# Patient Record
Sex: Female | Born: 1950 | Race: White | Hispanic: No | Marital: Married | Smoking: Never smoker
Health system: Southern US, Community
[De-identification: ages and names within clinical notes are randomized; demographics above are authoritative.]

## PROBLEM LIST (undated history)

## (undated) DIAGNOSIS — E039 Hypothyroidism, unspecified: Secondary | ICD-10-CM

## (undated) DIAGNOSIS — E7800 Pure hypercholesterolemia, unspecified: Secondary | ICD-10-CM

## (undated) DIAGNOSIS — E119 Type 2 diabetes mellitus without complications: Secondary | ICD-10-CM

## (undated) DIAGNOSIS — F419 Anxiety disorder, unspecified: Secondary | ICD-10-CM

## (undated) DIAGNOSIS — H5440 Blindness, one eye, unspecified eye: Secondary | ICD-10-CM

## (undated) DIAGNOSIS — K219 Gastro-esophageal reflux disease without esophagitis: Secondary | ICD-10-CM

## (undated) DIAGNOSIS — L0390 Cellulitis, unspecified: Secondary | ICD-10-CM

## (undated) DIAGNOSIS — N390 Urinary tract infection, site not specified: Secondary | ICD-10-CM

## (undated) DIAGNOSIS — M1990 Unspecified osteoarthritis, unspecified site: Secondary | ICD-10-CM

## (undated) DIAGNOSIS — G709 Myoneural disorder, unspecified: Secondary | ICD-10-CM

## (undated) DIAGNOSIS — G35 Multiple sclerosis: Secondary | ICD-10-CM

## (undated) DIAGNOSIS — Z7401 Bed confinement status: Secondary | ICD-10-CM

## (undated) DIAGNOSIS — E079 Disorder of thyroid, unspecified: Secondary | ICD-10-CM

## (undated) HISTORY — PX: CHOLECYSTECTOMY: SHX55

## (undated) HISTORY — PX: ADENOIDECTOMY: SUR15

## (undated) HISTORY — PX: WRIST SURGERY: SHX841

## (undated) HISTORY — PX: FRACTURE SURGERY: SHX138

## (undated) HISTORY — PX: LYMPH GLAND EXCISION: SHX13

## (undated) HISTORY — PX: TONSILLECTOMY: SUR1361

---

## 2002-11-20 ENCOUNTER — Ambulatory Visit (HOSPITAL_COMMUNITY): Admission: RE | Admit: 2002-11-20 | Discharge: 2002-11-20 | Payer: Self-pay | Admitting: Urology

## 2002-11-20 ENCOUNTER — Encounter: Payer: Self-pay | Admitting: Urology

## 2003-07-03 HISTORY — PX: ORIF ANKLE FRACTURE: SUR919

## 2003-12-27 ENCOUNTER — Inpatient Hospital Stay (HOSPITAL_COMMUNITY): Admission: EM | Admit: 2003-12-27 | Discharge: 2003-12-31 | Payer: Self-pay | Admitting: Emergency Medicine

## 2004-01-28 ENCOUNTER — Emergency Department (HOSPITAL_COMMUNITY): Admission: EM | Admit: 2004-01-28 | Discharge: 2004-01-28 | Payer: Self-pay | Admitting: Emergency Medicine

## 2007-10-18 ENCOUNTER — Inpatient Hospital Stay (HOSPITAL_COMMUNITY): Admission: EM | Admit: 2007-10-18 | Discharge: 2007-10-26 | Payer: Self-pay | Admitting: Emergency Medicine

## 2007-10-19 ENCOUNTER — Encounter (INDEPENDENT_AMBULATORY_CARE_PROVIDER_SITE_OTHER): Payer: Self-pay | Admitting: Internal Medicine

## 2007-10-20 ENCOUNTER — Encounter (INDEPENDENT_AMBULATORY_CARE_PROVIDER_SITE_OTHER): Payer: Self-pay | Admitting: Internal Medicine

## 2007-10-20 ENCOUNTER — Ambulatory Visit: Payer: Self-pay | Admitting: Vascular Surgery

## 2007-10-23 ENCOUNTER — Ambulatory Visit: Payer: Self-pay | Admitting: Physical Medicine & Rehabilitation

## 2008-10-12 ENCOUNTER — Encounter: Admission: RE | Admit: 2008-10-12 | Discharge: 2008-12-28 | Payer: Self-pay | Admitting: Urology

## 2008-11-01 ENCOUNTER — Inpatient Hospital Stay (HOSPITAL_COMMUNITY): Admission: EM | Admit: 2008-11-01 | Discharge: 2008-11-10 | Payer: Self-pay | Admitting: Emergency Medicine

## 2008-11-08 ENCOUNTER — Ambulatory Visit: Payer: Self-pay | Admitting: Physical Medicine & Rehabilitation

## 2008-11-10 ENCOUNTER — Inpatient Hospital Stay (HOSPITAL_COMMUNITY)
Admission: RE | Admit: 2008-11-10 | Discharge: 2008-11-25 | Payer: Self-pay | Admitting: Physical Medicine & Rehabilitation

## 2008-12-27 ENCOUNTER — Emergency Department (HOSPITAL_COMMUNITY): Admission: EM | Admit: 2008-12-27 | Discharge: 2008-12-28 | Payer: Self-pay | Admitting: Emergency Medicine

## 2009-03-01 ENCOUNTER — Inpatient Hospital Stay (HOSPITAL_COMMUNITY): Admission: EM | Admit: 2009-03-01 | Discharge: 2009-03-10 | Payer: Self-pay | Admitting: Emergency Medicine

## 2009-03-02 ENCOUNTER — Ambulatory Visit: Payer: Self-pay | Admitting: Vascular Surgery

## 2009-03-02 ENCOUNTER — Encounter (INDEPENDENT_AMBULATORY_CARE_PROVIDER_SITE_OTHER): Payer: Self-pay | Admitting: Internal Medicine

## 2009-03-03 ENCOUNTER — Ambulatory Visit: Payer: Self-pay | Admitting: Physical Medicine & Rehabilitation

## 2009-03-10 ENCOUNTER — Ambulatory Visit: Payer: Self-pay | Admitting: Physical Medicine & Rehabilitation

## 2009-03-10 ENCOUNTER — Inpatient Hospital Stay (HOSPITAL_COMMUNITY)
Admission: RE | Admit: 2009-03-10 | Discharge: 2009-03-25 | Payer: Self-pay | Admitting: Physical Medicine & Rehabilitation

## 2009-03-18 ENCOUNTER — Ambulatory Visit: Payer: Self-pay | Admitting: Psychology

## 2009-06-03 ENCOUNTER — Inpatient Hospital Stay (HOSPITAL_COMMUNITY): Admission: EM | Admit: 2009-06-03 | Discharge: 2009-06-10 | Payer: Self-pay | Admitting: Emergency Medicine

## 2009-06-28 ENCOUNTER — Inpatient Hospital Stay (HOSPITAL_COMMUNITY): Admission: EM | Admit: 2009-06-28 | Discharge: 2009-07-02 | Payer: Self-pay | Admitting: Emergency Medicine

## 2009-07-02 ENCOUNTER — Emergency Department (HOSPITAL_COMMUNITY): Admission: EM | Admit: 2009-07-02 | Discharge: 2009-07-03 | Payer: Self-pay | Admitting: Emergency Medicine

## 2010-03-05 ENCOUNTER — Emergency Department (HOSPITAL_COMMUNITY): Admission: EM | Admit: 2010-03-05 | Discharge: 2010-03-06 | Payer: Self-pay | Admitting: Emergency Medicine

## 2010-03-29 ENCOUNTER — Ambulatory Visit: Payer: Self-pay | Admitting: Pulmonary Disease

## 2010-03-29 ENCOUNTER — Inpatient Hospital Stay (HOSPITAL_COMMUNITY): Admission: EM | Admit: 2010-03-29 | Discharge: 2010-04-02 | Payer: Self-pay | Admitting: Emergency Medicine

## 2010-04-11 ENCOUNTER — Ambulatory Visit: Payer: Self-pay | Admitting: Thoracic Surgery

## 2010-04-17 ENCOUNTER — Ambulatory Visit (HOSPITAL_COMMUNITY): Admission: RE | Admit: 2010-04-17 | Discharge: 2010-04-17 | Payer: Self-pay | Admitting: Thoracic Surgery

## 2010-04-24 ENCOUNTER — Ambulatory Visit: Payer: Self-pay | Admitting: Thoracic Surgery

## 2010-04-24 ENCOUNTER — Encounter: Payer: Self-pay | Admitting: Thoracic Surgery

## 2010-04-24 ENCOUNTER — Ambulatory Visit (HOSPITAL_COMMUNITY): Admission: RE | Admit: 2010-04-24 | Discharge: 2010-04-25 | Payer: Self-pay | Admitting: Thoracic Surgery

## 2010-04-24 HISTORY — PX: MEDIASTINOSCOPY: SUR861

## 2010-06-21 ENCOUNTER — Encounter: Admission: RE | Admit: 2010-06-21 | Payer: Self-pay | Source: Home / Self Care | Admitting: Geriatric Medicine

## 2010-09-13 LAB — SURGICAL PCR SCREEN
MRSA, PCR: NEGATIVE
Staphylococcus aureus: NEGATIVE

## 2010-09-13 LAB — COMPREHENSIVE METABOLIC PANEL
ALT: 24 U/L (ref 0–35)
AST: 35 U/L (ref 0–37)
Albumin: 3.6 g/dL (ref 3.5–5.2)
Alkaline Phosphatase: 96 U/L (ref 39–117)
BUN: 13 mg/dL (ref 6–23)
CO2: 30 mEq/L (ref 19–32)
Calcium: 9.6 mg/dL (ref 8.4–10.5)
Chloride: 102 mEq/L (ref 96–112)
Creatinine, Ser: 0.59 mg/dL (ref 0.4–1.2)
GFR calc Af Amer: >60 mL/min (ref >60)
GFR calc non Af Amer: >60 mL/min (ref >60)
Glucose, Bld: 103 mg/dL — ABNORMAL HIGH (ref 70–99)
Potassium: 3.3 mEq/L — ABNORMAL LOW (ref 3.5–5.1)
Sodium: 141 mEq/L (ref 135–145)
Total Bilirubin: 1 mg/dL (ref 0.3–1.2)
Total Protein: 7 g/dL (ref 6.0–8.3)

## 2010-09-13 LAB — CBC
HCT: 35.9 % — ABNORMAL LOW (ref 36.0–46.0)
HCT: 42.7 % (ref 36.0–46.0)
Hemoglobin: 14.2 g/dL (ref 12.0–15.0)
MCH: 28.4 pg (ref 26.0–34.0)
MCH: 29.6 pg (ref 26.0–34.0)
MCHC: 31.5 g/dL (ref 30.0–36.0)
MCHC: 33.3 g/dL (ref 30.0–36.0)
MCV: 89.1 fL (ref 78.0–100.0)
Platelets: 258 10*3/uL (ref 150–400)
RBC: 4.79 MIL/uL (ref 3.87–5.11)
RDW: 13.7 % (ref 11.5–15.5)
RDW: 13.9 % (ref 11.5–15.5)
WBC: 8.8 10*3/uL (ref 4.0–10.5)

## 2010-09-13 LAB — GLUCOSE, CAPILLARY: Glucose-Capillary: 98 mg/dL (ref 70–99)

## 2010-09-13 LAB — BASIC METABOLIC PANEL
BUN: 8 mg/dL (ref 6–23)
GFR calc non Af Amer: >60 mL/min (ref >60)
Glucose, Bld: 85 mg/dL (ref 70–99)
Potassium: 3.5 mEq/L (ref 3.5–5.1)

## 2010-09-13 LAB — PROTIME-INR: Prothrombin Time: 12.6 seconds (ref 11.6–15.2)

## 2010-09-13 LAB — CULTURE, RESPIRATORY W GRAM STAIN

## 2010-09-13 LAB — FUNGUS CULTURE W SMEAR: Fungal Smear: NONE SEEN

## 2010-09-13 LAB — AFB CULTURE WITH SMEAR (NOT AT ARMC): Acid Fast Smear: NONE SEEN

## 2010-09-14 LAB — POCT CARDIAC MARKERS: Troponin i, poc: <0.05 ng/mL (ref 0.00–0.09)

## 2010-09-14 LAB — CBC
HCT: 37.8 % (ref 36.0–46.0)
HCT: 41.2 % (ref 36.0–46.0)
MCH: 29.7 pg (ref 26.0–34.0)
MCH: 30.2 pg (ref 26.0–34.0)
MCV: 90.6 fL (ref 78.0–100.0)
MCV: 90.7 fL (ref 78.0–100.0)
MCV: 90.3 fL (ref 78.0–100.0)
Platelets: 242 10*3/uL (ref 150–400)
Platelets: 244 10*3/uL (ref 150–400)
Platelets: 307 10*3/uL (ref 150–400)
RBC: 4.17 MIL/uL (ref 3.87–5.11)
RDW: 12.9 % (ref 11.5–15.5)
RDW: 12 % (ref 11.5–15.5)
WBC: 9.7 10*3/uL (ref 4.0–10.5)
WBC: 11.7 10*3/uL — ABNORMAL HIGH (ref 4.0–10.5)

## 2010-09-14 LAB — URINE CULTURE
Colony Count: >=100000
Colony Count: >=100000
Culture  Setup Time: 201109281820
Culture  Setup Time: 201109051057

## 2010-09-14 LAB — COMPREHENSIVE METABOLIC PANEL
Albumin: 2.6 g/dL — ABNORMAL LOW (ref 3.5–5.2)
Alkaline Phosphatase: 116 U/L (ref 39–117)
BUN: 9 mg/dL (ref 6–23)
CO2: 28 mEq/L (ref 19–32)
Chloride: 104 mEq/L (ref 96–112)
Creatinine, Ser: 0.57 mg/dL (ref 0.4–1.2)
GFR calc non Af Amer: >60 mL/min (ref >60)
Glucose, Bld: 97 mg/dL (ref 70–99)
Potassium: 3.6 mEq/L (ref 3.5–5.1)
Total Bilirubin: 0.8 mg/dL (ref 0.3–1.2)

## 2010-09-14 LAB — DIFFERENTIAL
Basophils Absolute: 0 10*3/uL (ref 0.0–0.1)
Basophils Absolute: 0 10*3/uL (ref 0.0–0.1)
Eosinophils Absolute: 0.1 10*3/uL (ref 0.0–0.7)
Eosinophils Relative: 1 % (ref 0–5)
Eosinophils Relative: 1 % (ref 0–5)
Lymphocytes Relative: 9 % — ABNORMAL LOW (ref 12–46)
Lymphs Abs: 0.6 10*3/uL — ABNORMAL LOW (ref 0.7–4.0)
Monocytes Absolute: 0.8 10*3/uL (ref 0.1–1.0)

## 2010-09-14 LAB — LACTATE DEHYDROGENASE: LDH: 133 U/L (ref 94–250)

## 2010-09-14 LAB — CULTURE, BLOOD (ROUTINE X 2)
Culture  Setup Time: 201109290353
Culture  Setup Time: 201109290354
Culture: NO GROWTH

## 2010-09-14 LAB — URINALYSIS, ROUTINE W REFLEX MICROSCOPIC
Bilirubin Urine: NEGATIVE
Ketones, ur: NEGATIVE mg/dL
Nitrite: POSITIVE — AB
Protein, ur: NEGATIVE mg/dL
Protein, ur: 30 mg/dL — AB
Urobilinogen, UA: 1 mg/dL (ref 0.0–1.0)
Urobilinogen, UA: 0.2 mg/dL (ref 0.0–1.0)

## 2010-09-14 LAB — BASIC METABOLIC PANEL
BUN: 9 mg/dL (ref 6–23)
CO2: 30 mEq/L (ref 19–32)
Chloride: 99 mEq/L (ref 96–112)
Creatinine, Ser: 0.6 mg/dL (ref 0.4–1.2)
Potassium: 3.7 mEq/L (ref 3.5–5.1)

## 2010-09-14 LAB — URINE MICROSCOPIC-ADD ON

## 2010-09-14 LAB — TSH: TSH: 0.013 u[IU]/mL — ABNORMAL LOW (ref 0.350–4.500)

## 2010-09-14 LAB — URIC ACID: Uric Acid, Serum: 9.3 mg/dL — ABNORMAL HIGH (ref 2.4–7.0)

## 2010-09-17 LAB — DIFFERENTIAL
Basophils Absolute: 0 10*3/uL (ref 0.0–0.1)
Basophils Relative: 0 % (ref 0–1)
Eosinophils Absolute: 0.4 10*3/uL (ref 0.0–0.7)
Neutrophils Relative %: 83 % — ABNORMAL HIGH (ref 43–77)

## 2010-09-17 LAB — CBC
MCHC: 34.7 g/dL (ref 30.0–36.0)
MCV: 89.4 fL (ref 78.0–100.0)
Platelets: 321 10*3/uL (ref 150–400)
RDW: 13.3 % (ref 11.5–15.5)
WBC: 8.1 10*3/uL (ref 4.0–10.5)

## 2010-09-17 LAB — POCT CARDIAC MARKERS
CKMB, poc: <1 ng/mL — ABNORMAL LOW (ref 1.0–8.0)
Myoglobin, poc: 199 ng/mL (ref 12–200)

## 2010-09-17 LAB — PROTIME-INR
INR: 0.95 (ref 0.00–1.49)
Prothrombin Time: 12.6 seconds (ref 11.6–15.2)

## 2010-09-17 LAB — BASIC METABOLIC PANEL
BUN: 10 mg/dL (ref 6–23)
CO2: 27 mEq/L (ref 19–32)
Chloride: 101 mEq/L (ref 96–112)
Creatinine, Ser: 0.61 mg/dL (ref 0.4–1.2)

## 2010-10-02 LAB — BASIC METABOLIC PANEL
CO2: 28 mEq/L (ref 19–32)
Calcium: 8.7 mg/dL (ref 8.4–10.5)
GFR calc Af Amer: >60 mL/min (ref >60)
GFR calc non Af Amer: >60 mL/min (ref >60)
GFR calc non Af Amer: >60 mL/min (ref >60)
Glucose, Bld: 106 mg/dL — ABNORMAL HIGH (ref 70–99)
Glucose, Bld: 113 mg/dL — ABNORMAL HIGH (ref 70–99)
Potassium: 3.5 mEq/L (ref 3.5–5.1)
Potassium: 3.9 mEq/L (ref 3.5–5.1)
Sodium: 138 mEq/L (ref 135–145)
Sodium: 138 mEq/L (ref 135–145)

## 2010-10-02 LAB — URINALYSIS, ROUTINE W REFLEX MICROSCOPIC
Ketones, ur: NEGATIVE mg/dL
Protein, ur: 30 mg/dL — AB
Urobilinogen, UA: 0.2 mg/dL (ref 0.0–1.0)

## 2010-10-02 LAB — CBC
HCT: 38.9 % (ref 36.0–46.0)
HCT: 46.5 % — ABNORMAL HIGH (ref 36.0–46.0)
Hemoglobin: 13.1 g/dL (ref 12.0–15.0)
Hemoglobin: 15.5 g/dL — ABNORMAL HIGH (ref 12.0–15.0)
Platelets: 297 10*3/uL (ref 150–400)
RBC: 5.2 MIL/uL — ABNORMAL HIGH (ref 3.87–5.11)
RDW: 13.9 % (ref 11.5–15.5)
RDW: 14.1 % (ref 11.5–15.5)
WBC: 8.7 10*3/uL (ref 4.0–10.5)

## 2010-10-02 LAB — URINALYSIS, MICROSCOPIC ONLY
Bilirubin Urine: NEGATIVE
Glucose, UA: NEGATIVE mg/dL
Specific Gravity, Urine: 1.022 (ref 1.005–1.030)
Urobilinogen, UA: 0.2 mg/dL (ref 0.0–1.0)
pH: 5.5 (ref 5.0–8.0)

## 2010-10-02 LAB — URINE CULTURE: Colony Count: >=100000

## 2010-10-02 LAB — DIFFERENTIAL
Basophils Absolute: 0 10*3/uL (ref 0.0–0.1)
Eosinophils Relative: 4 % (ref 0–5)
Lymphocytes Relative: 8 % — ABNORMAL LOW (ref 12–46)
Lymphs Abs: 0.7 10*3/uL (ref 0.7–4.0)
Monocytes Absolute: 0.5 10*3/uL (ref 0.1–1.0)
Monocytes Relative: 6 % (ref 3–12)
Neutro Abs: 8 10*3/uL — ABNORMAL HIGH (ref 1.7–7.7)

## 2010-10-02 LAB — URINE MICROSCOPIC-ADD ON

## 2010-10-03 LAB — COMPREHENSIVE METABOLIC PANEL
ALT: 16 U/L (ref 0–35)
AST: 27 U/L (ref 0–37)
CO2: 29 mEq/L (ref 19–32)
Chloride: 100 mEq/L (ref 96–112)
GFR calc Af Amer: >60 mL/min (ref >60)
GFR calc non Af Amer: >60 mL/min (ref >60)
Potassium: 3.4 mEq/L — ABNORMAL LOW (ref 3.5–5.1)
Sodium: 138 mEq/L (ref 135–145)
Total Bilirubin: 0.8 mg/dL (ref 0.3–1.2)

## 2010-10-03 LAB — BASIC METABOLIC PANEL
BUN: 7 mg/dL (ref 6–23)
CO2: 31 mEq/L (ref 19–32)
Calcium: 9.1 mg/dL (ref 8.4–10.5)
Chloride: 102 mEq/L (ref 96–112)
Chloride: 106 mEq/L (ref 96–112)
Creatinine, Ser: 0.68 mg/dL (ref 0.4–1.2)
Creatinine, Ser: 0.85 mg/dL (ref 0.4–1.2)
GFR calc Af Amer: >60 mL/min (ref >60)
GFR calc non Af Amer: >60 mL/min (ref >60)

## 2010-10-03 LAB — URINE CULTURE
Colony Count: >=100000
Colony Count: NO GROWTH
Culture: NO GROWTH
Special Requests: NEGATIVE

## 2010-10-03 LAB — CBC
HCT: 36.9 % (ref 36.0–46.0)
MCHC: 33.8 g/dL (ref 30.0–36.0)
MCV: 89.1 fL (ref 78.0–100.0)
MCV: 89.3 fL (ref 78.0–100.0)
Platelets: 357 10*3/uL (ref 150–400)
RBC: 4.09 MIL/uL (ref 3.87–5.11)
RBC: 4.62 MIL/uL (ref 3.87–5.11)
WBC: 10.3 10*3/uL (ref 4.0–10.5)
WBC: 5.2 10*3/uL (ref 4.0–10.5)

## 2010-10-03 LAB — CREATININE, SERUM: GFR calc Af Amer: >60 mL/min (ref >60)

## 2010-10-03 LAB — DIFFERENTIAL
Eosinophils Absolute: 0.7 10*3/uL (ref 0.0–0.7)
Eosinophils Relative: 7 % — ABNORMAL HIGH (ref 0–5)
Lymphs Abs: 0.9 10*3/uL (ref 0.7–4.0)

## 2010-10-03 LAB — URINE MICROSCOPIC-ADD ON

## 2010-10-03 LAB — URINALYSIS, ROUTINE W REFLEX MICROSCOPIC
Bilirubin Urine: NEGATIVE
Bilirubin Urine: NEGATIVE
Ketones, ur: NEGATIVE mg/dL
Nitrite: NEGATIVE
Nitrite: NEGATIVE
Protein, ur: NEGATIVE mg/dL
Specific Gravity, Urine: 1.012 (ref 1.005–1.030)
Urobilinogen, UA: 0.2 mg/dL (ref 0.0–1.0)
Urobilinogen, UA: 0.2 mg/dL (ref 0.0–1.0)
pH: 6 (ref 5.0–8.0)

## 2010-10-03 LAB — LIPASE, BLOOD: Lipase: 26 U/L (ref 11–59)

## 2010-10-03 LAB — GENTAMICIN LEVEL, RANDOM: Gentamicin Rm: 11.3 ug/mL

## 2010-10-06 LAB — COMPREHENSIVE METABOLIC PANEL
ALT: 24 U/L (ref 0–35)
AST: 23 U/L (ref 0–37)
Albumin: 3.3 g/dL — ABNORMAL LOW (ref 3.5–5.2)
Albumin: 3.8 g/dL (ref 3.5–5.2)
Alkaline Phosphatase: 100 U/L (ref 39–117)
Alkaline Phosphatase: 125 U/L — ABNORMAL HIGH (ref 39–117)
BUN: 11 mg/dL (ref 6–23)
BUN: 8 mg/dL (ref 6–23)
Chloride: 98 mEq/L (ref 96–112)
GFR calc Af Amer: >60 mL/min (ref >60)
Potassium: 3.4 mEq/L — ABNORMAL LOW (ref 3.5–5.1)
Potassium: 3.6 mEq/L (ref 3.5–5.1)
Sodium: 137 mEq/L (ref 135–145)
Total Bilirubin: 0.5 mg/dL (ref 0.3–1.2)
Total Protein: 6.3 g/dL (ref 6.0–8.3)

## 2010-10-06 LAB — CBC
HCT: 35.4 % — ABNORMAL LOW (ref 36.0–46.0)
HCT: 35.5 % — ABNORMAL LOW (ref 36.0–46.0)
HCT: 35.9 % — ABNORMAL LOW (ref 36.0–46.0)
HCT: 36.5 % (ref 36.0–46.0)
Hemoglobin: 12.4 g/dL (ref 12.0–15.0)
Hemoglobin: 12.5 g/dL (ref 12.0–15.0)
Hemoglobin: 12.5 g/dL (ref 12.0–15.0)
MCHC: 33.9 g/dL (ref 30.0–36.0)
MCHC: 34.1 g/dL (ref 30.0–36.0)
MCHC: 34.2 g/dL (ref 30.0–36.0)
MCHC: 34.3 g/dL (ref 30.0–36.0)
MCHC: 34.4 g/dL (ref 30.0–36.0)
MCV: 86.4 fL (ref 78.0–100.0)
MCV: 87.1 fL (ref 78.0–100.0)
MCV: 87.4 fL (ref 78.0–100.0)
MCV: 87.5 fL (ref 78.0–100.0)
Platelets: 309 10*3/uL (ref 150–400)
Platelets: 314 10*3/uL (ref 150–400)
Platelets: 320 10*3/uL (ref 150–400)
Platelets: 335 10*3/uL (ref 150–400)
Platelets: 337 10*3/uL (ref 150–400)
Platelets: 337 10*3/uL (ref 150–400)
Platelets: 345 10*3/uL (ref 150–400)
RBC: 4.13 MIL/uL (ref 3.87–5.11)
RBC: 4.19 MIL/uL (ref 3.87–5.11)
RBC: 4.25 MIL/uL (ref 3.87–5.11)
RDW: 14 % (ref 11.5–15.5)
RDW: 14 % (ref 11.5–15.5)
RDW: 14.1 % (ref 11.5–15.5)
RDW: 14.3 % (ref 11.5–15.5)
WBC: 6.1 10*3/uL (ref 4.0–10.5)
WBC: 7.7 10*3/uL (ref 4.0–10.5)
WBC: 6.5 10*3/uL (ref 4.0–10.5)

## 2010-10-06 LAB — BASIC METABOLIC PANEL
BUN: 10 mg/dL (ref 6–23)
BUN: 10 mg/dL (ref 6–23)
BUN: 15 mg/dL (ref 6–23)
BUN: 8 mg/dL (ref 6–23)
CO2: 28 mEq/L (ref 19–32)
CO2: 28 mEq/L (ref 19–32)
CO2: 29 mEq/L (ref 19–32)
CO2: 30 mEq/L (ref 19–32)
CO2: 31 mEq/L (ref 19–32)
Calcium: 9.1 mg/dL (ref 8.4–10.5)
Calcium: 9.3 mg/dL (ref 8.4–10.5)
Calcium: 9.4 mg/dL (ref 8.4–10.5)
Calcium: 9.5 mg/dL (ref 8.4–10.5)
Chloride: 100 mEq/L (ref 96–112)
Chloride: 103 mEq/L (ref 96–112)
Chloride: 103 mEq/L (ref 96–112)
Creatinine, Ser: 0.79 mg/dL (ref 0.4–1.2)
Creatinine, Ser: 0.85 mg/dL (ref 0.4–1.2)
Creatinine, Ser: 0.87 mg/dL (ref 0.4–1.2)
Creatinine, Ser: 0.89 mg/dL (ref 0.4–1.2)
Creatinine, Ser: 0.95 mg/dL (ref 0.4–1.2)
GFR calc Af Amer: >60 mL/min (ref >60)
GFR calc Af Amer: >60 mL/min (ref >60)
GFR calc Af Amer: >60 mL/min (ref >60)
GFR calc Af Amer: >60 mL/min (ref >60)
GFR calc Af Amer: >60 mL/min (ref >60)
GFR calc non Af Amer: >60 mL/min (ref >60)
GFR calc non Af Amer: >60 mL/min (ref >60)
GFR calc non Af Amer: >60 mL/min (ref >60)
Glucose, Bld: 101 mg/dL — ABNORMAL HIGH (ref 70–99)
Glucose, Bld: 93 mg/dL (ref 70–99)
Glucose, Bld: 96 mg/dL (ref 70–99)
Potassium: 3.4 mEq/L — ABNORMAL LOW (ref 3.5–5.1)
Potassium: 4 mEq/L (ref 3.5–5.1)
Sodium: 138 mEq/L (ref 135–145)
Sodium: 141 mEq/L (ref 135–145)

## 2010-10-06 LAB — DIFFERENTIAL
Basophils Absolute: 0 10*3/uL (ref 0.0–0.1)
Basophils Absolute: 0 10*3/uL (ref 0.0–0.1)
Basophils Relative: 0 % (ref 0–1)
Basophils Relative: 0 % (ref 0–1)
Eosinophils Absolute: 0.2 10*3/uL (ref 0.0–0.7)
Eosinophils Absolute: 0.5 10*3/uL (ref 0.0–0.7)
Eosinophils Relative: 8 % — ABNORMAL HIGH (ref 0–5)
Monocytes Absolute: 0.5 10*3/uL (ref 0.1–1.0)
Monocytes Relative: 10 % (ref 3–12)
Neutro Abs: 3.9 10*3/uL (ref 1.7–7.7)
Neutro Abs: 4.2 10*3/uL (ref 1.7–7.7)
Neutrophils Relative %: 64 % (ref 43–77)

## 2010-10-06 LAB — URINE CULTURE
Colony Count: NO GROWTH
Special Requests: NEGATIVE

## 2010-10-06 LAB — VANCOMYCIN, RANDOM: Vancomycin Rm: 22 ug/mL

## 2010-10-07 LAB — URINE CULTURE: Colony Count: 50000

## 2010-10-07 LAB — BASIC METABOLIC PANEL
BUN: 15 mg/dL (ref 6–23)
CO2: 31 mEq/L (ref 19–32)
Calcium: 9.7 mg/dL (ref 8.4–10.5)
GFR calc non Af Amer: >60 mL/min (ref >60)
Glucose, Bld: 108 mg/dL — ABNORMAL HIGH (ref 70–99)

## 2010-10-07 LAB — DIFFERENTIAL
Basophils Absolute: 0 10*3/uL (ref 0.0–0.1)
Basophils Relative: 0 % (ref 0–1)
Eosinophils Relative: 2 % (ref 0–5)
Lymphocytes Relative: 12 % (ref 12–46)
Monocytes Absolute: 0.5 10*3/uL (ref 0.1–1.0)
Neutro Abs: 6.5 10*3/uL (ref 1.7–7.7)

## 2010-10-07 LAB — URINALYSIS, ROUTINE W REFLEX MICROSCOPIC
Bilirubin Urine: NEGATIVE
Ketones, ur: NEGATIVE mg/dL
Nitrite: NEGATIVE
Protein, ur: 100 mg/dL — AB
Specific Gravity, Urine: 1.012 (ref 1.005–1.030)
Urobilinogen, UA: 0.2 mg/dL (ref 0.0–1.0)

## 2010-10-07 LAB — CULTURE, BLOOD (ROUTINE X 2)
Culture: NO GROWTH
Culture: NO GROWTH

## 2010-10-07 LAB — CBC
MCHC: 34.3 g/dL (ref 30.0–36.0)
Platelets: 368 10*3/uL (ref 150–400)
RDW: 14.2 % (ref 11.5–15.5)

## 2010-10-07 LAB — URINE MICROSCOPIC-ADD ON

## 2010-10-09 LAB — POCT I-STAT, CHEM 8
BUN: 22 mg/dL (ref 6–23)
Creatinine, Ser: 0.9 mg/dL (ref 0.4–1.2)
Hemoglobin: 13.6 g/dL (ref 12.0–15.0)
Potassium: 4.2 mEq/L (ref 3.5–5.1)
Sodium: 140 mEq/L (ref 135–145)

## 2010-10-09 LAB — CBC
HCT: 38.8 % (ref 36.0–46.0)
Hemoglobin: 13.2 g/dL (ref 12.0–15.0)
RBC: 4.5 MIL/uL (ref 3.87–5.11)
RDW: 13.6 % (ref 11.5–15.5)

## 2010-10-09 LAB — DIFFERENTIAL
Eosinophils Relative: 3 % (ref 0–5)
Lymphocytes Relative: 11 % — ABNORMAL LOW (ref 12–46)
Lymphs Abs: 0.9 10*3/uL (ref 0.7–4.0)
Monocytes Absolute: 0.3 10*3/uL (ref 0.1–1.0)
Monocytes Relative: 4 % (ref 3–12)
Neutro Abs: 6.5 10*3/uL (ref 1.7–7.7)

## 2010-10-09 LAB — URINALYSIS, ROUTINE W REFLEX MICROSCOPIC
Glucose, UA: NEGATIVE mg/dL
Hgb urine dipstick: NEGATIVE
Protein, ur: NEGATIVE mg/dL
Specific Gravity, Urine: 1.01 (ref 1.005–1.030)
Urobilinogen, UA: 0.2 mg/dL (ref 0.0–1.0)

## 2010-10-09 LAB — URINE CULTURE

## 2010-10-10 LAB — URINALYSIS, MICROSCOPIC ONLY
Bilirubin Urine: NEGATIVE
Hgb urine dipstick: NEGATIVE
Ketones, ur: NEGATIVE mg/dL
Nitrite: NEGATIVE
Protein, ur: NEGATIVE mg/dL
Specific Gravity, Urine: 1.013 (ref 1.005–1.030)
Urobilinogen, UA: 0.2 mg/dL (ref 0.0–1.0)
Urobilinogen, UA: 0.2 mg/dL (ref 0.0–1.0)
pH: 7 (ref 5.0–8.0)

## 2010-10-10 LAB — BASIC METABOLIC PANEL
BUN: 10 mg/dL (ref 6–23)
BUN: 12 mg/dL (ref 6–23)
BUN: 22 mg/dL (ref 6–23)
BUN: 9 mg/dL (ref 6–23)
CO2: 27 mEq/L (ref 19–32)
CO2: 27 mEq/L (ref 19–32)
CO2: 27 mEq/L (ref 19–32)
CO2: 28 mEq/L (ref 19–32)
Calcium: 8.4 mg/dL (ref 8.4–10.5)
Calcium: 8.8 mg/dL (ref 8.4–10.5)
Calcium: 8.9 mg/dL (ref 8.4–10.5)
Chloride: 105 mEq/L (ref 96–112)
Chloride: 106 mEq/L (ref 96–112)
Chloride: 107 mEq/L (ref 96–112)
Chloride: 107 mEq/L (ref 96–112)
Chloride: 108 mEq/L (ref 96–112)
Chloride: 108 mEq/L (ref 96–112)
Chloride: 109 mEq/L (ref 96–112)
Chloride: 109 mEq/L (ref 96–112)
Creatinine, Ser: 0.83 mg/dL (ref 0.4–1.2)
Creatinine, Ser: 0.83 mg/dL (ref 0.4–1.2)
Creatinine, Ser: 0.84 mg/dL (ref 0.4–1.2)
Creatinine, Ser: 0.94 mg/dL (ref 0.4–1.2)
GFR calc Af Amer: >60 mL/min (ref >60)
GFR calc Af Amer: >60 mL/min (ref >60)
GFR calc Af Amer: >60 mL/min (ref >60)
GFR calc Af Amer: >60 mL/min (ref >60)
GFR calc Af Amer: >60 mL/min (ref >60)
GFR calc non Af Amer: >60 mL/min (ref >60)
GFR calc non Af Amer: >60 mL/min (ref >60)
GFR calc non Af Amer: >60 mL/min (ref >60)
GFR calc non Af Amer: >60 mL/min (ref >60)
Glucose, Bld: 100 mg/dL — ABNORMAL HIGH (ref 70–99)
Glucose, Bld: 89 mg/dL (ref 70–99)
Glucose, Bld: 95 mg/dL (ref 70–99)
Potassium: 3.4 mEq/L — ABNORMAL LOW (ref 3.5–5.1)
Potassium: 3.5 mEq/L (ref 3.5–5.1)
Potassium: 3.5 mEq/L (ref 3.5–5.1)
Potassium: 4.3 mEq/L (ref 3.5–5.1)
Sodium: 140 mEq/L (ref 135–145)
Sodium: 141 mEq/L (ref 135–145)
Sodium: 142 mEq/L (ref 135–145)

## 2010-10-10 LAB — CBC
HCT: 31.3 % — ABNORMAL LOW (ref 36.0–46.0)
HCT: 34.1 % — ABNORMAL LOW (ref 36.0–46.0)
HCT: 40.6 % (ref 36.0–46.0)
HCT: 34.6 % — ABNORMAL LOW (ref 36.0–46.0)
Hemoglobin: 11 g/dL — ABNORMAL LOW (ref 12.0–15.0)
Hemoglobin: 11 g/dL — ABNORMAL LOW (ref 12.0–15.0)
Hemoglobin: 11.7 g/dL — ABNORMAL LOW (ref 12.0–15.0)
Hemoglobin: 13.9 g/dL (ref 12.0–15.0)
MCHC: 34.4 g/dL (ref 30.0–36.0)
MCHC: 34.5 g/dL (ref 30.0–36.0)
MCHC: 35 g/dL (ref 30.0–36.0)
MCV: 88.2 fL (ref 78.0–100.0)
MCV: 89.3 fL (ref 78.0–100.0)
MCV: 89.4 fL (ref 78.0–100.0)
MCV: 89.7 fL (ref 78.0–100.0)
MCV: 88.5 fL (ref 78.0–100.0)
Platelets: 268 10*3/uL (ref 150–400)
Platelets: 298 10*3/uL (ref 150–400)
Platelets: 432 10*3/uL — ABNORMAL HIGH (ref 150–400)
Platelets: 298 10*3/uL (ref 150–400)
RBC: 3.52 MIL/uL — ABNORMAL LOW (ref 3.87–5.11)
RBC: 3.55 MIL/uL — ABNORMAL LOW (ref 3.87–5.11)
RBC: 3.57 MIL/uL — ABNORMAL LOW (ref 3.87–5.11)
RBC: 3.63 MIL/uL — ABNORMAL LOW (ref 3.87–5.11)
RBC: 3.84 MIL/uL — ABNORMAL LOW (ref 3.87–5.11)
RBC: 4.54 MIL/uL (ref 3.87–5.11)
RBC: 3.9 MIL/uL (ref 3.87–5.11)
RDW: 14 % (ref 11.5–15.5)
RDW: 14.4 % (ref 11.5–15.5)
WBC: 6.9 10*3/uL (ref 4.0–10.5)
WBC: 7 10*3/uL (ref 4.0–10.5)
WBC: 8.7 10*3/uL (ref 4.0–10.5)
WBC: 6.9 10*3/uL (ref 4.0–10.5)

## 2010-10-10 LAB — URINALYSIS, ROUTINE W REFLEX MICROSCOPIC
Glucose, UA: NEGATIVE mg/dL
Hgb urine dipstick: NEGATIVE
Ketones, ur: NEGATIVE mg/dL
Protein, ur: NEGATIVE mg/dL
pH: 7.5 (ref 5.0–8.0)

## 2010-10-10 LAB — URINE CULTURE
Colony Count: >=100000
Colony Count: NO GROWTH
Culture: NO GROWTH
Culture: NO GROWTH

## 2010-10-10 LAB — DIFFERENTIAL
Basophils Absolute: 0 10*3/uL (ref 0.0–0.1)
Eosinophils Absolute: 0.2 10*3/uL (ref 0.0–0.7)
Eosinophils Relative: 2 % (ref 0–5)
Lymphocytes Relative: 11 % — ABNORMAL LOW (ref 12–46)
Lymphocytes Relative: 14 % (ref 12–46)
Lymphs Abs: 1 10*3/uL (ref 0.7–4.0)
Monocytes Absolute: 0.5 10*3/uL (ref 0.1–1.0)
Monocytes Relative: 6 % (ref 3–12)
Neutro Abs: 4.8 10*3/uL (ref 1.7–7.7)

## 2010-10-10 LAB — CLOSTRIDIUM DIFFICILE EIA
C difficile Toxins A+B, EIA: NEGATIVE
C difficile Toxins A+B, EIA: NEGATIVE

## 2010-10-10 LAB — TSH: TSH: 2.898 u[IU]/mL (ref 0.350–4.500)

## 2010-10-10 LAB — CULTURE, BLOOD (ROUTINE X 2): Culture: NO GROWTH

## 2010-10-10 LAB — COMPREHENSIVE METABOLIC PANEL
BUN: 9 mg/dL (ref 6–23)
CO2: 29 mEq/L (ref 19–32)
Chloride: 106 mEq/L (ref 96–112)
Creatinine, Ser: 1 mg/dL (ref 0.4–1.2)
GFR calc non Af Amer: 57 mL/min — ABNORMAL LOW (ref >60)
Total Bilirubin: 0.4 mg/dL (ref 0.3–1.2)

## 2010-10-10 LAB — SEDIMENTATION RATE: Sed Rate: 12 mm/hr (ref 0–22)

## 2010-10-10 LAB — C-REACTIVE PROTEIN: CRP: 0.2 mg/dL — ABNORMAL LOW (ref <0.6)

## 2010-10-10 LAB — URINE MICROSCOPIC-ADD ON

## 2010-10-10 LAB — VANCOMYCIN, TROUGH: Vancomycin Tr: 34.3 ug/mL (ref 10.0–20.0)

## 2010-11-14 NOTE — H&P (Signed)
[name], [name]             ACCOUNT NO.:  [id number]   MEDICAL RECORD NO.:  [id number]          PATIENT TYPE:  INP   LOCATION:  0107                         FACILITY:  [facility]   PHYSICIAN:  Lonia Blood, M.D.DATE OF BIRTH:  1951-01-30   DATE OF ADMISSION:  10/18/2007  DATE OF DISCHARGE:                              HISTORY & PHYSICAL   PRIMARY CARE PHYSICIAN:  Unassigned.   CHIEF COMPLAINT:  Weakness.   PRESENT ILLNESS:  [name]. Mcroy is a 60 year old female who  lives in [place] but receives all of her medical care in [place]  and [place], [place].  She presented, however, to [facility]  today for evaluation.  [name] has a longstanding history of  multiple sclerosis and is treated for this by a neurologist in [place].  She reports that over the last 48 hours she has developed progressively  worse lower extremity weakness which is typical of her MS flares.  This  has been coincident with development of a circumferential red, warm band  just superior to the ankle on the right lower extremity.  She does not  remember any damage to this leg or to the skin in that area.  There have  been no recent bug bites or scratches.  She does state that she has had  intermittent difficulty with cellulitis of the legs.  The weakness of  her lower extremities is described as a waxing and waning generalized  weakness.  There is no complete paralysis but there is sufficient  weakness that she is unable to ambulate.  This been no loss of bowel or  bladder continence.  There is been no headache.  There is been no  significant fevers or chills.  There has been no nausea, vomiting, chest  pain, shortness of breath.   REVIEW OF SYSTEMS:  Comprehensive review of systems unremarkable with  the exception of the multiple positive elements noted in the history of  present illness above.   PAST MEDICAL HISTORY:  1. Multiple sclerosis  2. Left ankle fracture 2005  status post open reduction internal      fixation.  3. Hypothyroidism.  4. Anxiety disorder.  5. Status post cholecystectomy.  6. Status post tonsillectomy and adenoidectomy.  7. Status post right wrist surgery secondary to injury.   OUTPATIENT MEDICATIONS:  1. Nexium 40 mg daily.  2. Synthroid 100 mcg daily.  3. Baclofen 20 mg q.i.d.  4. Alprazolam 0.5 mg t.i.d. p.r.n.  5. Provigil 200 mg b.i.d.  6. Amantidine 100 mg t.i.d.  7. Enablex 7.5 mg b.i.d.  8. Triamterene/hydrochlorothiazide 75/50 p.o. daily.  9. Neurontin 800 mg p.o. q.i.d.  10.Tysabri for MS IV infusion monthly.   ALLERGIES:  PENICILLIN.   FAMILY HISTORY:  Noncontributory.   SOCIAL HISTORY:  The patient does not smoke.  She does not drink.  She  has no children.  She is married.  She lives with her husband in  [place].   DATA REVIEW:  White count is markedly elevated at 12.7 with a normal  platelet count, normal hemoglobin, normal MCV.  Sodium is normal.  Potassium  is low at 3.3.  Chloride, bicarb, BUN and creatinine are  normal.  Serum glucose is normal.  Calcium is normal.  LFTs are normal.  Albumin is 3.5.  Urinalysis is unrevealing.  Chest x-ray reveals no  acute disease as reviewed by this examiner.   PHYSICAL EXAMINATION:  Temperature 98.0, blood pressure 122/49, heart  rate 100, respiratory rate 18, O2 saturation is 98% room air.  GENERAL:  Obese female in no acute respiratory distress.  HEENT:  Normocephalic, atraumatic.  Pupils equal, round, react to light  and accommodation.  Extraocular muscles intact bilaterally.  OC/OP  clear.  NECK:  No JVD.  LUNGS:  Clear to auscultation bilaterally without wheezes or rhonchi.  CARDIOVASCULAR:  Regular rate and rhythm without murmur, gallop or rub  with normal S1 and S2.  ABDOMEN:  Obese, soft, bowel sounds present.  No hepatosplenomegaly, no  rebound, no ascites.  EXTREMITIES:  Show 1+ bilateral lower extremity edema with possibly  increased edema on the  right compared to the left.  There is a  circumferential approximately 3-inch-wide band approximately 1 inch  superior to the right ankle that is extremely erythematous and very warm  to touch.  There is no obvious cutaneous wound.  There is no purulent  discharge.  There is some induration of this erythematous area.  Dorsalis pedis pulses 1+ are palpable at bilateral lower extremities.  NEUROLOGIC:  Cranial nerves II-XII are intact bilaterally.  There is 5/5  strength in bilateral upper extremities.  There is 4+/5 strength in the  left lower extremity and 4/5 strength in the right lower extremity.  There is no Babinski.  The patient is alert and oriented.   IMPRESSION AND PLAN:  1. Right lower extremity cellulitis.  The patient is suffering with a      significant right lower extremity cellulitis.  Due to her      PENICILLIN allergy, I will treat her with Avelox.  I am hoping that      this is the cause of her MS flare.  Given her obesity and her      tendency to lead a sessile lifestyle, I am concerned about the      possibility of DVT as well.  I will order a venous Doppler of both      extremities for completeness sake and fully anticoagulate her until      such time that we can prove she does not have DVT.  2. Multiple sclerosis with lower extremity weakness.  I do feel that      the patient's weakness is consistent with her MS flare.  She has no      low back symptoms to suggest cord compression.  There has been no      recent significant trauma to suggest a fracture of the spine.  For      now, I hope to hold off on a steroid burst out of concern that this      could worsen her cellulitis.  If treatment of cellulitis itself,      however, does not improve the patient's MS      symptoms, we will may be forced to consider IV Solu-Medrol.  3. Hypothyroidism.  We will check a TSH and we will continue her      Synthroid dose.  4. Anxiety disorder.  Will continue alprazolam as dosed at  home.      Lonia Blood, M.D.  Electronically Signed     JTM/MEDQ  D:  10/18/2007  T:  10/18/2007  Job:  [id number]

## 2010-11-14 NOTE — Discharge Summary (Signed)
NAMEKATHYJO, Stanley             ACCOUNT NO.:  [id number]   MEDICAL RECORD NO.:  [id number]          PATIENT TYPE:  IPS   LOCATION:  4008                         FACILITY:  [facility]   PHYSICIAN:  Ranelle Oyster, M.D.DATE OF BIRTH:  December 31, 1950   DATE OF ADMISSION:  11/10/2008  DATE OF DISCHARGE:  11/25/2008                               DISCHARGE SUMMARY   DISCHARGE DIAGNOSES:  1. Multiple sclerosis pseudoexacerbation.  2. Frequency.  3. Morbid obesity.   HISTORY OF PRESENT ILLNESS:  [name] is a 60 year old female with  history of MS, chronic pain with neuropathy, admitted to [facility] with weakness, falls, and increasing bilateral lower extremity  edema.  The patient with history of chronic stasis ulcers and had been  off doxycycline prior to admission.  She was started on IV antibiotics  for cellulitis.  UA UC done, was positive for E. coli.  The patient was  treated with IV vancomycin until May 2009.  Bilateral lower extremity  ulcers and blisters have dried up per report.  Therapies were initiated  and the patient is noted to be deconditioned having issues with  endurance.  The patient was evaluated by Rehab and found to be an  appropriate candidate for inpatient rehab program.   PAST MEDICAL HISTORY:  Significant for morbid obesity, MS with  exacerbation one month ago requiring initiation of Betaseron, stasis  ulcers of bilateral lower extremity, T&A, GERD, cholecystectomy,  neurogenic bladder, anxiety disorder, neuropathy of bilateral lower  extremity and right lower extremity weakness, left ankle fracture with  ORIF in April 2005, hypothyroidism, and bilateral lower extremity  cellulitis requiring admission in April 2009.   ALLERGIES:  PENICILLIN and AVELOX.   FAMILY HISTORY:  Positive for COPD.   SOCIAL HISTORY:  The patient is married, lives in 1-level home with a  ramp at entry.  Does not use any tobacco or alcohol.  Has been getting  home health  physical therapy for the past few weeks.  Has hired a help  once per week to help with housework and ADLs.   FUNCTIONAL HISTORY:  The patient was independent, ambulating household  distances with walker.  This is likely to help mobilize right lower  extremity.  Sponge bathe during the week and has assist to shower once a  week.   FUNCTIONAL STATUS:  The patient is supervision to min assist for  transfers, min and guide assist for ambulating 12 feet with a rolling  walker, min assist for toileting.   PHYSICAL EXAMINATION:  VITAL SIGNS:  Blood pressure 118/68, pulse 69,  respiratory rate 18, temperature 97.1, and weight is 112 kg.  GENERAL:  The patient is generally pleasant, alert, oriented female,  obese.  HEENT:  Pupils are equal, round, and reactive to light.  Nares patent.  Oral mucosa moist.  Borderline dentition.  NECK:  Supple without JVD or lymphadenopathy.  LUNGS:  Clear to auscultation bilaterally without wheezes, rales, or  rhonchi.  HEART:  Regular rate rhythm without murmurs or gallops.  ABDOMEN:  Soft and nontender with positive bowel sounds.  EXTREMITIES:  No evidence of  clubbing or cyanosis.  Bilateral lower  extremity show 1+ edema with stasis changes.  SKIN:  Chronic changes, right lower extremity with ecchymosis and healed  areas of cellulitis.  Left lower extremity still has an open area  approximately 2 inches in diameter with slight drainage, this is  superficial, however.  NEUROLOGIC:  Cranial nerves II-XII revealed decreased vision,  particularly right eye more than left, although she has intact visual  fields with additional scanning.  Reflexes are essentially 2+ in the  lower extremity and upper extremity.  Sensation is decreased in both  hands and feet bilaterally.  Strength is essentially 4/5 upper extremity  proximal to distal.  Lower extremity strength is 1-2/5 proximal, 4/5  distal.  She has a bit more strength in the left proximal than right  lower  extremity.  Judgment, orientation, memory, and mood are fairly  intact.   HOSPITAL COURSE:  [name] was admitted to Rehab Nov 10, 2008, for  inpatient therapies to consist of PT/OT at least 3 hours 5 days a week.  Past admission, physiatrist, Rehab RN, and therapy team have worked  together to provide customized collaborative interdisciplinary care.  Rehab RN has been working with the patient on bowel and bladder  management as well as close monitoring of wounds.  The patient had been  on IV antibiotics for 8 total days of treatment.  Therefore, these were  discontinued past admission, especially as the patient reported having  issues with diarrhea.  Stools were sent for check of C. diff and these  were noted to be negative x2.  A CBC past admission revealed hemoglobin  11.8, hematocrit 34.6, white count 6.9, and platelets 298.  Check of  lytes revealed sodium 142, potassium 3.9, chloride 106, CO2 of 29, BUN  9, and creatinine 1.00.  Repeat UA UC was done on Nov 12, 2008, to check  for efficacy of antibiotic treatment and also since Foley was  discontinued.  UA was negative and urine culture showed no growth.  Foley was discontinued on Nov 12, 2008.  Voiding was monitored to check  for any signs of retention or overflow.  The patient was noted to be  voiding with PVRs at 130-160 mL.  She was noted to have issues with  hyperactive bladder and her Enablex was resumed to help with  hyperactivity symptoms.  The patient's bilateral lower extremity had  been monitored closely by Rehab RN.  The patient's lower extremities  cellulitic type changes had resolved by the time of discharge.  The  patient was placed on a low-salt diet and peripheral edema had  essentially resolved by the time of discharge.  The patient has had  issues with some low back pain and this was treated with Phos Cream as  well as Ultram 25-50 mg q.6 hours p.r.n.  The patient's blood pressures  were monitored on a b.i.d.  basis during this stay.  These have been  controlled ranging from 120s-130s systolics, 70s-80s diastolic.  Heart  rate has been in 80s range overall.  The patient has been afebrile  during this stay.  P.o. intake has been good and the patient has been  continent of bladder with scheduled toileting.   During the patient's stay in Rehab, weekly team conferences were held to  monitor the patient's progress, set goals as well as discuss barriers to  discharge.  At the time of admission, the patient was noted be limited  by lower extremity edema, overall weakness, decreased  endurance,  decrease in lower extremity strength.  The patient with poor  proprioception.  She was also noted to have decrease in dynamic standing  balance with difficulty walking due to her recent medical issues.  The  patient was mod assist for bed mobility, max assist to semi-recline.  She requires supervision to propel wheelchair 75 feet with increased  time with decrease efficiency of propelling due to her body habitus.  She was mod assist for standing.  Able to ambulate 45 feet with min  assist.  Physical therapy has been working with the patient on  endurance.  The patient did report some issues with left shoulder pain  due to putting increased weight on her walker and this has been iced  with improvement in her symptomatology.  PT has been working with the  patient and balance due to challenge stability.  The patient has  progressed along to being at modified independent with mobility with  hospital bed using Thera-Band to lift the legs onto bed.  She is  modified independent for transfers including locking and unlocking her  new walker.  She requires min assist with right lower extremity for car  transfers.  She is able to ambulate household distances with  supervision.  The patient is ambulating 80 feet x2, able to maneuver  around objects with a rolling walker.  The patient is modified  independent for dynamic  standing balance.  OT has been working with the  patient on sit to stand transition as well as use of Adaptic equipment  for self-care.  OT has also been working with the patient on shower  level transfers with focus on activity tolerance for functional mobility  and problem solving with her neuro later.  OT has focused on use of  adaptive equipment training and problem solving to increase independence  with lower body dressing as well as allowing safety with toileting tasks  with extra time.  Home visit was done to evaluate safety with  recommendations given to remove fresh carpet in the patient's bedroom  and hallway to help the patient move around better.  As the patient  tends to have right knee buckling and right foot drag with fatigue,  recommendations were made to use a wheelchair at home.  However, due to  narrowed doorway and thick carpet, this would not be effective.  Recommendations were also made to have a home health aide to assist at  home when friends are unavailable to assist with ADLs.  The patient will  continue to benefit from home health PT to maximize safe functional  level.  Skilled home health PT and OT have been set up for followup past  discharge.   DISCHARGE MEDICATIONS:  1. Vitamin D 400 mg a day.  2. Synthroid 100 mcg a day.  3. Neurontin 400 mg 2 p.o. q.i.d.  4. Amantadine 100 mg b.i.d.  5. Vitamin C 500 mg a day.  6. Xanax 0.5 mg q.8 hours.  7. Provigil 200 mg b.i.d.  8. Nexium 40 mg a day.  9. Betaseron every other day.  10.Enablex 7.5 mg b.i.d.  11.Simethicone 80 mg a.c. t.i.d. for bloating.  12.Baclofen 20 mg q.8 h.  13.Tylenol as needed for pain.  14.Ecotrin 81 mg p.o. per day.   DIET:  Low-fat, low-salt.   ACTIVITY LEVEL:  Intermittent supervision.  No strenuous activity.  No  alcohol, no smoking, no driving.  Followup home health PT, OT, RN, CNA,  and social worker by [facility].   WOUND CARE:  Edema control by elevating lower  extremities when at rest  and to apply cream to bilateral lower extremity to prevent breakdown.   FOLLOWUP:  The patient to follow up with Dr. Riley Kill as needed.  Follow  up with Dr. Harlow Mares or [facility] on [place] in 2 weeks.  Follow up with  Dr. Daphane Shepherd for routine check.      Greg Cutter, P.A.      Ranelle Oyster, M.D.  Electronically Signed    PP/MEDQ  D:  12/01/2008  T:  12/02/2008  Job:  [id number]   cc:   Francena Hanly, MD  [facility]  Adalberto Cole, MD  Dr. Otilio Miu

## 2010-11-14 NOTE — H&P (Signed)
[name], [name]             ACCOUNT NO.:  [id number]   MEDICAL RECORD NO.:  [id number]          PATIENT TYPE:  INP   LOCATION:  0104                         FACILITY:  [facility]   PHYSICIAN:  Peggye Pitt, M.D. DATE OF BIRTH:  1950-11-13   DATE OF ADMISSION:  03/01/2009  DATE OF DISCHARGE:                              HISTORY & PHYSICAL   PRIMARY CARE PHYSICIAN:  Dr. Lester Carlsborg of [facility]   NEUROLOGIST:  Dr. Daphane Shepherd of [facility] in [place]   CHIEF COMPLAINT:  Lower extremity weakness and can't walk.   HISTORY OF PRESENT ILLNESS:  Ms. Shuttleworth is a 60 year old female patient  with history of MS and neurogenic bladder as well as several other  comorbidities.  She was recently in the [facility] and  discharged on Nov 25, 2008.  She was treated there because of lower  extremity weakness secondary to pseudoexacerbation of multiple sclerosis  apparently brought about by E. coli urinary tract infection during the  acute care portion of her hospitalization.  This was resistant to  ampicillin and chloroquinolones.  The patient was treated with Rocephin  at that time.  At discharge according to the summary from the rehab  unit, the patient was able to ambulate with a rolling walker 12 feet  with minimal assistance.  Since discharge, patient initially did okay,  but her air conditioner broke down, and she does not tolerate heat,  i.e., this increases her MS exacerbation symptoms, she has noted  progressive decrease in ambulation abilities despite having home health  PT come to the home on a regular basis.  She now reports she is only  able to ambulate 6 feet with a rolling walker.  In addition to the home  health PT, she also has certified nursing assistants to come help with  ADL care including helping with the Foley catheter care and an RN comes  one time a month from home health to change the catheter out.  She was  brought  to the ER by her family today.  Her white count was normal, but  she did have a left shift.  Her electrolytes were normal except for mild  hyperglycemia with a glucose of 108.  Her urinalysis was markedly  abnormal.  A culture has been obtained and is pending.  Patient and  husband initially were uncertain as to whether they wanted her admitted  and then placed in skilled nursing versus making another attempt at  rehab.  This is per Dr. Ignacia Palma.  We have been asked to evaluate the  patient for admission and treatment of UTI and deconditioning.   REVIEW OF SYSTEMS:  CONSTITUTIONAL:  No definite fevers per thermometer  check at home.  She has felt flushed and may have been experiencing some  subjective fevers for 24 hours.  No chills, no myalgias, no malaise.  Some poor p.o. intake for the past few days.  PSYCH:  No increase in  social stressors.  No self-reports of anxiety, depression or intent to  harm self.  NEURO:  She reports progressive lower extremity weakness  which has been worse today.  Some mild dizziness, but this has mainly  occurred with her head bent forward, and this has only been one  occurrence.  Otherwise, this category is negative.  HEENT:  This  category is negative or noncontributory.  CHEST:  No shortness of  breath, no cough, no hemoptysis, no upper respiratory infection  symptoms.  CARDIOVASCULAR:  She has noticed increased swelling in the  left lower extremity for about 2 days.  She normally eats no added salt  diet because of her chronic lower extremity edema.  She has had no chest  pain or no tachy-palpitations, no orthopnea or dyspnea on exertion.  ABDOMEN:  Benign except for reports of poor p.o. intake and anorexia.  GENITOURINARY:  Patient has noticed increased concentration of the urine  in her Foley catheter for about 2 days.  No other discharge around the  catheter insertion site, maybe some mild suprapubic pain.  MUSCULOSKELETAL:  She has noticed some  slight increase in discomfort of  the anterior left lower extremity for 2 days.  Otherwise, this category  is negative.  SKIN:  She has noticed increased warmth and slight  increased redness to the left lower extremity below the knee for about 2  days.  LYMPHATICS:  Noncontributory to this exam.   SOCIAL HISTORY:  She is married, does not use tobacco or alcohol  products.   FAMILY HISTORY:  Noncontributory.   PAST MEDICAL HISTORY:  1. 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  2. Obesity.  3. Chronic lower extremity edema with intermittent stasis ulcers and      dermatitis exacerbations.  4. Hypothyroidism.  5. GERD.  6. Anxiety disorder.   PAST SURGICAL HISTORY:  1. Tonsillectomy and adenoidectomy.  2. Remote cholecystectomy.  3. ORIF with fracture left ankle repair.   ALLERGIES:  PENICILLIN and AVELOX.   CURRENT MEDICATIONS:  1. Vitamin D 400 mg daily.  2. Synthroid 100 mcg daily.  3. Neurontin 800 mg q.i.d.  4. Amantadine 100 mg b.i.d.  5. Vitamin C 500 mg daily.  6. Xanax 0.5 mg q.8 h.  7. Provigil 200 mg b.i.d.  8. Nexium 40 mg daily.  9. Betaseron.  Patient could not remember dose.  Subcutaneously every      other day.  Next dose due on March 02, 2009.  10.Enablex 7.5 mg b.i.d.  11.Simethicone 80 mg a.c. t.i.d. p.r.n.  12.Baclofen 20 mg q.8 h.  13.Tylenol 650 mg q.4 h. p.r.n. pain or fever.  14.Ecotrin 81 mg daily.   PHYSICAL EXAMINATION:  GENERAL:  Pale-appearing female patient  complaining of acute on chronic lower extremity weakness and now an  inability to walk.  VITAL SIGNS:  Temp 98.7, BP 148/82, pulse 92 and regular, respirations  20.  PSYCH:  The patient is alert and oriented x3.  Affect appropriate to  current situation.  NEURO:  Cranial nerves II-XII are grossly intact.  No focal neurological  deficits.  Her lower extremity sensation is intact, and her strength is  2/5  bilaterally with upper extremity strength being 4/5.  Her lower  extremities DTRs are markedly decreased at 1+.  She is not  hyperreflexic.  HEENT:  Eyes:  Sclerae are not injected, nonicteric.  Conjunctivae are  pink.  Ears, nose and throat:  Ears are symmetrical, no otorrhea.  Nose  is midline, no rhinorrhea.  Oral mucous membranes are dry.  Tongue is  slightly  reddened.  CHEST:  Bilateral lung sounds are clear to auscultation anteriorly.  Respiratory effort is nonlabored.  She is on room air saturating 97%.  CARDIOVASCULAR:  Heart sounds are S1, S2, without obvious rubs, murmurs,  thrills or gallops, no definite JVD.  She has some subtle left lower  extremity edema around the anterior tibialis and malleolar area.  Bilateral pulses are 2+, carotid, radial, femoral and dorsalis pedis.  ABDOMEN:  Obese but soft, nontender, nondistended without  hepatosplenomegaly, masses or bruits.  Bowel sounds are present in all 4  quadrants.  GENITOURINARY:  The patient has Foley catheter in place with cloudy  yellow urine and sediment in the bag.  MUSCULOSKELETAL:  Extremities are symmetrical in appearance without  cyanosis or clubbing or atrophy.  SKIN:  She has chronic venous stasis hemosiderin changes to bilateral  lower extremities.  The left lower extremity around the anterior  tibialis has a slightly increased redness pattern with warmth to the  touch and some very tiny clear vesicles accumulated in the same area in  no particular pattern.  This is all consistent with cellulitic changes.   LABORATORY:  White count 8100, hemoglobin 14.4, platelets 368,000,  neutrophils 81%, sodium 141, potassium 4, CO2 of 31, glucose 108, BUN  15, creatinine 0.87.  Urinalysis:  Large amount of hemoglobin, 100 of  protein, moderate leukocyte esterase, 21-50 WBCs and RBCs, few bacteria.  Cultures are pending.   IMPRESSION:  1. Acute on chronic lower extremity weakness in a multiple sclerosis      patient.   2. Urinary tract infection in a patient with chronic indwelling Foley      due to neurogenic bladder associated with multiple sclerosis.  3. Hypothyroidism.  4. Suspected volume depletion, mild.  5. Gastroesophageal reflux disease.  6. Anxiety.  7. Obesity.  8. Acute stasis dermatitis involving left lower extremity.   PLAN:  1. Admit to inpatient status general floor to the [facility].  2. IV fluid hydration at 100 an hour for the first 24 hours, then at      50 an hour, empiric Rocephin IV to cover UTI and cellulitic issues.      Follow up on previously ordered urinalysis cultures.  3. Check blood cultures x2 before the initiation of antibiotic      therapy.  4. Change out Foley.  5. Once the acute infections and dehydration have been effectively      treated, we will consult rehab to determine if patient is      appropriate for another course of short-term rehab and return home      versus placement in skilled nursing facility.  In the interim, we      will order PT and have the patient out of bed to the chair t.i.d.  6. Follow up with labs in the morning, CBC and BMET.  7. Otherwise, continue home medications.  Betaseron is nonformulary      here, and husband will bring from home.  Next dose is due tomorrow,      March 02, 2009.  8. No added salt diet as per patient's usual at home.  9. DVT prophylaxis with Lovenox.  10.Patient is already on Nexium home dose.      Allison L. Vicente Males, M.D.  Electronically Signed    ALE/MEDQ  D:  03/01/2009  T:  03/01/2009  Job:  [id number]   cc:  Lester Brisbane, MD   Marlena Clipper, MD, [facility]  [place], [place]

## 2010-11-14 NOTE — H&P (Signed)
NAMEBRUCHA, [name]             ACCOUNT NO.:  [id number]   MEDICAL RECORD NO.:  [id number]          PATIENT TYPE:  IPS   LOCATION:  4028                         FACILITY:  [facility]   PHYSICIAN:  Ranelle Oyster, M.D.DATE OF BIRTH:  [birth year]   DATE OF ADMISSION:  11/10/2008  DATE OF DISCHARGE:                              HISTORY & PHYSICAL   CHIEF COMPLAINT:  Weakness after multiple medical problems.   PRIMARY PHYSICIAN:  Dr. Harlow Mares in [place].   NEUROLOGIST:  Dr. Daphane Shepherd.   HISTORY OF PRESENT ILLNESS:  This is a 60 year old white female with MS  and chronic pain related to neuropathy admitted on Nov 01, 2008 with  weakness and fall and bilateral lower extremity weakness.  The patient  was found to have chronic stasis ulcers and had been off of her  doxycycline prior to arrival.  She was started on IV antibiotics for  cellulitis and UA C and S was done revealing E. coli UTI.  She was  treated with IV vancomycin through Nov 07, 2008 and extremity ulcers and  other symptoms have improved.  She was placed on Lasix for diuresis as  well.  The patient was found to be overall weak.  Rehab was consulted on  Nov 08, 2008 and found that the patient was appropriate for an inpatient  rehab admission and thus she was ultimately brought here today.   REVIEW OF SYSTEMS:  Notable for weakness, incontinence, edema, numbness  in the lower extremities.  Pain under fair control.  Other pertinent  positives are above.  Full review is in the written H and P.   PAST MEDICAL HISTORY:  Positive for:  1. Morbid obesity.  2. MS with exacerbation 1 month ago.  Betaseron was initiated.  3. Stasis ulcers bilateral lower extremity.  4. T&A.  5. Gastroesophageal reflux disease.  6. Cholecystectomy.  7. Neurogenic bladder.  8. Anxiety disorder.  9. Neuropathy with right lower extremity weakness.  10.Left ankle fracture.  11.ORIF in L5.  12.Hypothyroidism.  13.Bilateral lower extremity cellulitis in  April.   FAMILY HISTORY:  COPD.   SOCIAL HISTORY:  The patient is married, lives in one-level house with  ramp at entrance.  She does not smoke or drink.  She has been getting  home health PT for a few weeks.  She has hired help at home for  housework and ADLs.  Her husband can provide some supervision but  apparently they are somewhat estranged.   ALLERGIES:  PENICILLIN and AVELOX.   HOME MEDICATIONS:  Betaseron, Provigil, Motrin, Enablex, Nexium,  aspirin, baclofen, vitamin D, Xanax, amantadine, Synthroid, and Lasix.   LABORATORY DATA:  White count 8.7, hemoglobin 13.9, platelets 432.  Sodium 140, potassium 4.3, BUN 22, creatinine 0.88.   PHYSICAL EXAMINATION:  VITAL SIGNS:  Blood pressure is 118/68, pulse is  69, respiratory rate 18, temperature 97.1.  Weight is 112 kg.  GENERAL:  The patient is generally pleasant, alert.  She is obese.  EYES:  Pupils equal, round and reactive to light.  EAR, NOSE AND THROAT:  Essentially is unremarkable.  Dentition and  mucosa are  fair to borderline.  NECK:  Supple without JVD or lymphadenopathy.  CHEST:  Clear to auscultation bilaterally without wheezes, rales or  rhonchi.  HEART:  Regular rate and rhythm without murmur, rubs or gallops.  ABDOMEN:  Soft, nontender.  Bowel sounds are positive.  EXTREMITIES:  No clubbing, cyanosis with 1+ edema.   SKIN:  Notable for chronic changes in the right lower extremity with  ecchymoses and healed areas of cellulitis.  Left lower extremity still  has an open area approximately 2 inches in diameter and has slight  drainage and is very superficial, however.  NEUROLOGIC:  Cranial nerves II-XII revealed decreased vision,  particularly in the right eye more than the left although she has intact  visual fields with additional scanning.  Reflexes are essentially 2+ in  the lower extremities and upper extremities today.  Sensation is  decreased in both hands and feet bilaterally.  Strength is essentially   4/5 in upper extremities proximal to distal.  Lower extremity strength  is 1-2/5 proximal to 4/5 distally.  She had a bit more strength in the  left proximal and then the right lower extremity today on examination.  Judgment, orientation, memory and mood were all fairly appropriate.  The  patient has a Foley catheter in place.   POST ADMISSION PHYSICIAN EVALUATION:  1. Functional deficits secondary to MS with a recent pseudo      exacerbation related to her UTI and cellulitis.  2. The patient is admitted to receive collaborative interdisciplinary      care between the physiatrist, rehab nursing staff and therapy team.  3. The patient's level of medical complexity and substantial therapy      needs in context of that medical necessity cannot be provided at a      lesser intensity of care.  4. The patient has experienced substantial functional loss from her      baseline.  Upon functional assessment at the time of preadmission      screening, the patient was at a mod assist level for bed mobility,      min assist level for transfers and ambulation 40 feet, min to max      with ADLs.  As of most recent evaluation, the patient has similar      level for function for transfers and gait.  She is min assist with      toileting.  Premorbidly, the patient was independent for ambulation      of household distances and self care although she did need a bit of      help occasionally with activities in house and house upkeep.      Judging by the patient's diagnosis, physical exam and functional      history, she has a potential for functional progress which will      result in measurable gains while in inpatient rehab.  These gains      will be of substantial and practical use upon discharge home in      facilitating mobility and self-care.  Interim changes in the      patient's medical status since our rehab consult are detailed in      the history of present illness above.  5. Physiatrist will  provide 24-hour management of medical needs as      well as oversight of the therapy plan/treatment and provide      guidance as appropriate regarding interaction of the two.  Medical      problem  list and plan are listed below.  6. A 24-hour rehab nursing will assist in management of the patient's      bowel and bladder issues as well as skin care, nutritional needs,      integration of therapy concepts and techniques.  7. PT will assess the lower extremity strength, adaptive equipment,      functional mobility, gait as well as the patient and caregiver      education.  Goals are modified independent to supervision.  8. OT will assess and treat for upper extremity use and ADLs as well      as neuromuscular reeducation, safety awareness and family/ patient      education with goals modified independent to occasional min assist.  9. Case management social worker will assess and treat for      psychosocial issues and discharge planning.  Need to determine to      what extent husband is willing to help her at home.  I am hopeful      that she can reach a lot of her higher-level supervision goals to      allow her to return home.  10.Team conferences will be held weekly to assess progress towards      goals and to determine barriers to discharge.  11.The patient has demonstrated sufficient medical stability and      exercise capacity to tolerate at least 3 hours of therapy per day      at least 5 days per week.  12.Estimated length of stay is 9-12 days.  Prognosis is fair to good.   MEDICAL PROBLEM LIST AND PLAN:  1. Escherichia coli urinary tract infection.  Cipro on board for      coverage.  Foley catheter remains in place.  We will discontinue      catheter in morning and begin voiding trial.  Hold Enablex as the      patient was using this prior to arrival and had significant      frequency.  Need to check postvoiding residuals to establish      functioning capacity of her bladder.   More likely this is a mixed      picture considering her multiple sclerosis.  Ultimately, may need      to look at longer term Foley or suprapubic catheter.  2. Neuropathy:  Neurontin and Motrin.  Follow clinically.  Pain seems      to be under reasonable control at this point.  3. Cellulitis.  Continue local wound care and moisturizing.  No active      signs of infection at this point.  Continue Mepilex to left shin      until the wound has sufficiently healed.  4. Arousal:  Continue amantadine and Provigil.  Ensure dosing is in      the morning and afternoon only to avoid nighttime hyperarousal.  5. Neuro:  Betaseron 0.3 mg subcu every other day.  The patient uses      her own supply.  No active issues or tolerance or problems as of      now with her Betaseron.  We will follow clinically.      Ranelle Oyster, M.D.  Electronically Signed     ZTS/MEDQ  D:  11/10/2008  T:  11/11/2008  Job:  [id number]   cc:   Marlena Clipper, MD  Lester Robinhood, MD

## 2010-11-14 NOTE — Discharge Summary (Signed)
NAMEBREALYN, [name]             ACCOUNT NO.:  [id number]   MEDICAL RECORD NO.:  [id number]          PATIENT TYPE:  INP   LOCATION:  1344                         [facility]   PHYSICIAN:  Eduard Clos, MDDATE OF BIRTH:  September 02, 1950   DATE OF ADMISSION:  10/18/2007  DATE OF DISCHARGE:                               DISCHARGE SUMMARY   HOSPITAL COURSE:  A 60 year old female with a known history of  hypertension, hypothyroidism, multiple sclerosis, generalized weakness.  The patient now is complaining of difficulty ambulating.  In addition on  admission the patient was found to have erythema in the right lower  extremity.  The patient was admitted for further workup on her  generalized weakness and possible cellulitis of her right lower  extremity.  The patient was admitted and started on IV antibiotics for  her cellulitis.  A physical therapy consult was obtained.  The patient's difficulty in ambulation improved with the physical  therapy.  At this time she is able to walk and her condition with regard  to mobility has reached her baseline.  Her cellulitis in the right lower extremity became more erythematous and  mild blebs had formed which had ruptured by themselves and initially the  patient was started on Avelox for which she developed a rash and was  discontinued and the antibiotics were changed.  Presently, the patient  is on IV doxycycline.  An MRI of the right lower extremity was done.  I  did discuss the MRI with the radiologist who said there was no abscess  or osteomyelitis and infection and the cellulitis is just superficial.  Today on examining the patient, the cellulitis has largely improved; so  at this time, we will discharge the patient home with home health care,  OT and PT, and we will also make an outpatient referral to the wound  center for which the patient has agreed to follow.  At the time of discharge the patient is hemodynamically stable.   DISCHARGE DIAGNOSES:  1. Right lower extremity cellulitis.  2. Multiple sclerosis.  3. Hypertension.  4. Hypothyroidism.   DISCHARGE MEDICATIONS:  1. Doxycycline 100 mg p.o. twice daily for 14 days.  2. Provigil 100 mg p.o. twice daily.  3. Prevacid 40 mg p.o. daily.  4. Neurontin 800 mg p.o. four times daily.  5. Baclofen 20 mg p.o. four times daily.  6. Alprazolam 0.5 mg three times daily as needed.  7. Synthroid 100 mcg  p.o. daily.  8. Amantadine 100 mg p.o. twice daily.  9. Meclizine 25 mg p.o. q.4 h. p.r.n.  10.Percocet 5/325 mg p.o. q.6 h. p.r.n. for pain.  11.Motrin p.r.n.   PLAN:  The patient advised to follow with her primary care physician  within a week's time and check a basic metabolic panel, to follow with  neurologist as scheduled.  The patient is on Tysabri for multiple  sclerosis IV  infusion every month.  The next dose is to be decided by her neurologist  and primary care based on her improvement with the cellulitis and being  off antibiotics which the patient understands.  The patient is to be on  a cardiac healthy diet, be on fall precautions, to follow with the wound  clinic on October 27, 2007 or October 28, 2007.      Eduard Clos, MD  Electronically Signed     ANK/MEDQ  D:  10/26/2007  T:  10/26/2007  Job:  [id number]

## 2010-11-14 NOTE — Letter (Signed)
April 11, 2010   [name]. [name]., MD  [address] [place], [place]  [address]   Re:  [name], [name]             DOB:  [birth year]   Dear Dr. Redmond Stanley:   I was referred the patient by the Hospitalist, Dr. Toniann Stanley.  She was  admitted to the hospital on March 29, 2010.  At that time, she had  generalized weakness, urinary tract infection, dehydration.  She is  essentially bedridden from her multiple sclerosis and has a neurogenic  bladder and also has a generalized anxiety disorder, had increasing pain  and weakness.  Other problems include morbid obesity, hypothyroidism.  While in the hospital, a CT scan was done because of a chest x-ray and  it was compared to a CT scan done in January.  The CT scan in January  was read as mild mediastinal adenopathy, but the lesion was really 2 cm  in size and recent CT scan showed that this is markedly increased and it  is up to 22 x 26 mm which had previously been 11 x 21 mm.  There was  some central carinal adenopathy that was there.  With an enlarging right  peritracheal node, obviously the worry is for cancer or lymphoma and it  could possibly also be an inflammatory process such as sarcoid.  There  are multiple other medical problems, it is worrisome how aggressive it  should be.   PAST MEDICAL HISTORY:  Significant for left radial fracture and a left  ankle fracture, cholecystectomy.   MEDICATIONS:  Baclofen, Neurontin, Xanax, Lasix, Synthroid, Nexium,  Enablex, ibuprofen, Zegerid, aspirin.   ALLERGIES:  She is allergic to Avelox, penicillin, and Cipro.   FAMILY HISTORY:  Positive for emphysema.   SOCIAL HISTORY:  She does not smoke.  Lives with her husband.  Disabled.  Does not drink alcohol on a regular basis.   REVIEW OF SYSTEMS:  GENERAL:  Her weight has been stable.  No decrease  in appetite.  CARDIAC:  No angina or atrial fibrillation.  PULMONARY:  No hemoptysis.  GI:  No reflux, nausea, vomiting.  GU:   See history of present illness.  VASCULAR:  No claudication, DVT, TIAs.  NEUROLOGIC:  See history of present illness.  No seizures.  MUSCULOSKELETAL:  See history of present illness.  PSYCHIATRIC:  See history of present illness.  EYE/ENT:  No change in her eyesight or hearing.  HEMATOLOGIC:  No problems with bleeding, clotting disorders, or anemia.   PHYSICAL EXAMINATION:  General:  She is an obese Caucasian female in no  acute distress.  Vital Signs:  Blood pressure is 131/92, pulse 102,  respirations 18.  Head, Eyes, Ears, Nose, and Throat:  Unremarkable.  Neck:  Supple without thyromegaly.  There is no supraclavicular or  axillary adenopathy.  Chest:  Clear to auscultation percussion.  Heart:  Regular sinus rhythm.  No murmurs.  Abdomen:  Soft and obese.  Bowel  sounds are normal.  She has an indwelling Foley catheter.  Extremities:  Pulses are 1+.  There is 2+ edema.  No clubbing.  She is in a  wheelchair.  Neurologic:  She is oriented x3.   ASSESSMENT AND PLAN:  Becomes of the enlarging adenopathy, she probably  would benefit from an endobronchial ultrasound and possibly  mediastinoscopy; however, before doing this I think she needs to have a  PET scan to see if it is positive in  this area.  I would not want to put  her in the risk of general anesthesia with her multiple medical problems  without a positive PET scan, so I have generally scheduled for April 24, 2010, for her general anesthesia and surgery.  We will try to get a  PET scan next week.   Sincerely,   [name], M.D.  Electronically Signed   DPB/MEDQ  D:  04/11/2010  T:  04/12/2010  Job:  [id number]

## 2010-11-14 NOTE — H&P (Signed)
NAMEEMER, ONNEN             ACCOUNT NO.:  [id number]   MEDICAL RECORD NO.:  [id number]          PATIENT TYPE:  INP   LOCATION:  1519                         FACILITY:  [facility]   PHYSICIAN:  Pedro Earls, MD     DATE OF BIRTH:  03-06-1951   DATE OF ADMISSION:  11/01/2008  DATE OF DISCHARGE:                              HISTORY & PHYSICAL   PRIMARY CARE PHYSICIAN:  [facility].   CHIEF COMPLAINT:  Bilateral lower extremity edema and weakness.   HISTORY OF PRESENT ILLNESS:  This is a 60 year old female patient with a  past medical history significant for multiple sclerosis who is currently  getting Betaseron, had been feeling weak, and had fallen a couple of  times at home where she had suffered some abrasions on her lower  extremities.  The patient stated that she had in the past taken  chronically doxycycline 100 mg b.i.d. but has not been taking it  recently.  Apparently, had fallen and had developed a blister on her  left lower extremity, which had ruptured and since then the patient has  been having severe pain and weakness in her bilateral lower extremities  and worsening of edema and pain.  The patient denies any nausea and  vomiting, any fever or chills, any shortness of breath or chest pain.   REVIEW OF SYSTEMS:  As above.  Rest of review of systems was negative.   PAST MEDICAL HISTORY:  1. Hypothyroidism.  2. Chronic pain.  3. Neuropathy.  4. GERD.  5. Anxiety.  6. Multiple sclerosis.   PAST SURGICAL HISTORY:  Cholecystectomy, tonsillectomy, and ankle and  wrist surgery.   SOCIAL HISTORY:  Negative for smoking, alcohol, and IV drug abuse.   FAMILY HISTORY:  Positive for COPD in mother.   MEDICATIONS:  1. Provigil 200 b.i.d.  2. Baclofen 20 mg 4 times a day.  3. Synthroid 100 mcg daily.  4. Motrin 800 mg b.i.d.  5. Enablex 7.5 mg b.i.d.  6. Amantadine 100 mg t.i.d.  7. Neurontin 800 mg q.i.d.  8. Nexium 40 mg daily.  9. Xanax 0.5 mg t.i.d.  10.Aspirin  81 mg daily.  11.Vitamin D 400 international units b.i.d.  12.Calcium 600 b.i.d.  13.Vitamin C 500 once daily.  14.Super B-Complex 1 tablet daily.  15.Lasix 40 mg daily.  16.Betaseron subcutaneously every other day.   ALLERGIES:  AVELOX and PENICILLIN.   PHYSICAL EXAMINATION:  VITAL SIGNS:  Temperature 98.3, blood pressure  149/54, pulse 94, respirations 20, and pulse ox 96% room air.  GENERAL:  The patient is awake, alert, and oriented x3.  Does not appear  to be in acute distress.  HEENT:  Pupils are equal, round, and reactive to light.  No icterus.  No  pallor.  Extraocular movements are intact.  Oral mucosa is dry.  NECK:  Supple.  No JVD.  No lymphadenopathy.  CVS:  S1 and S2.  Regular.  No murmurs, heaves, or gallops.  Chest:  Clear.  Abdomen:  Soft and nontender.  Bowel sounds are present.  No  hepatosplenomegaly.  EXTREMITIES:  Peripheral pulses present.  No clubbing  or cyanosis.  The  patient has +2 to +3 pitting edema in bilateral lower extremities, which  is chronic.  The patient has some warms and some open blisters on the  right lower extremity.  There are some changes of chronic lymphedema  present in the lower extremity as well.  CNS:  Sensory and motor grossly intact.  Cranial nerves II through XII  are intact.  SKIN:  As above.  MUSCULOSKELETAL:  Unremarkable.   LABORATORY DATA:  The patient's UA showed wbc's of 3 to 6 with small  leukocyte esterase, white count of 8.7 with a left shift, and H and H of  13.9 and 40.6.  Basic metabolic panel was fairly unremarkable.   IMPRESSION:  1. Bilateral lower extremity cellulitis, acute on chronic.  2. Multiple sclerosis  3. Hypothyroidism.  4. Weakness.  5. Dehydration.   PLAN:  1. Admit to med-surge.  2. Start intravenous Ancef.  3. Follow blood cultures.  4. Check urine cultures.  5. Continue intravenous fluids.  6. Resume all of her medications.      Pedro Earls, MD  Electronically Signed      NS/MEDQ  D:  11/01/2008  T:  11/02/2008  Job:  [id number]   cc:   Dala Dock

## 2010-11-14 NOTE — Discharge Summary (Signed)
[name], [name]             ACCOUNT NO.:  [id number]   MEDICAL RECORD NO.:  [id number]          PATIENT TYPE:  INP   LOCATION:  1519                         FACILITY:  [facility]   PHYSICIAN:  Isidor Holts, M.D.  DATE OF BIRTH:  1950-10-08   DATE OF ADMISSION:  11/01/2008  DATE OF DISCHARGE:  11/07/2008                               DISCHARGE SUMMARY   PRIMARY MEDICAL DOCTOR:  [facility].   DISCHARGE DIAGNOSES:  1. Recurrent falls, multifactorial.  2. Bilateral lower extremity cellulitis.  3. E. coli urinary tract infection.  4. Multiple sclerosis/chronic pain syndrome.  5. Dehydration.  6. Hypothyroidism.  7. Gastroesophageal reflux disease.  8. Anxiety.  9. Oral thrush.  10.Pruritic rash left lower extremity.   DISCHARGE MEDICATIONS:  1. Provigil 200 mg p.o. b.i.d.  2. Baclofen 20 mg p.o. q.i.d.  3. Synthroid 100 mcg p.o. daily.  4. Motrin 800 mg p.o. b.i.d. with food.  5. Enablex 7.5 mg p.o. b.i.d.  6. Amantadine 100 mg p.o. t.i.d.  7. Neurontin 800 mg p.o. q.i.d.  8. Nexium 40 mg p.o. daily.  9. Xanax 0.5 mg p.o. t.i.d.  10.Aspirin 81 mg p.o. daily.  11.Vitamin D 400 IU p.o. b.i.d.  12.Calcium 600 mg p.o. b.i.d.  13.Vitamin C 500 mg p.o. daily.  14.Super B complex 1 tablet p.o. daily.  15.Betaseron s.c. on alternate days per pre-admission schedule/dose.  16.Bactrim DS 1 p.o. b.i.d. for 7 days, from Nov 08, 2008.  17.Lotrimin AF 1% cream topically to left calf b.i.d. for 2 weeks.  18.Nystatin oral suspension 500,000 units swish and swallow q.i.d. for      1 week.   PROCEDURES:  None.   CONSULTATIONS:  None.   ADMISSION HISTORY:  As in history and physical notes of Nov 01, 2008,  dictated by Dr. Pedro Earls.  However, in brief, this is a 60-year-  old female, with known history of hypothyroidism, multiple sclerosis,  chronic pain syndrome, neuropathy, GERD, anxiety, status post left ankle  fracture requiring ORIF, status post cholecystectomy, status  post  tonsillectomy and adenoidectomy, status post right wrist surgery  secondary to injury, status post right lower extremity cellulitis April  2009, presenting with weakness bilateral lower extremity edema and  redness, associated with blistering.  On initial evaluation, she was  found to have bilateral lower extremity cellulitis associated with  blisters, which were ruptured.  In the recent past, she has had several  falls.  She was admitted for further evaluation, investigation, and  management.   CLINICAL COURSE:  1. Bilateral lower extremity cellulitis.  The patient was started on      intravenous Vancomycin and Fortaz.  Wound management care team was      consulted and they made recommendations with regard to the      patient's lower extremity blisters, ie nonstick dressings, as well      as Aquacel to improve healing and provide antibacterial benefits.      The patient was continued subsequently on therapy with Vancomycin,      and Elita Quick was discontinued.  Blood cultures remain consistently      negative.  Over the course of her hospitalization, the patient has      responded satisfactorily to the above-mentioned management      measures, and by Nov 03, 2008, erythema had practically disappeared,      blisters had dried up, and the patient felt considerably better.   1. Urinary tract infection.  The patient was found to have a positive      urine sediment, consistent with urinary tract infection.      Subsequent urine cultures grew E. coli, which was resistant to      quinolones and sensitive to cephalosporins.  The patient was      therefore, managed with intravenous Rocephin and by Nov 07, 2008,      she had completed 5 days of therapy.   1. Dehydration.  Patient's BUN at the time of presentation was 18 and      creatinine 0.83, and clinically she appeared dehydrated.  She was      managed with intravenous fluid hydration, and by Nov 07, 2008, BUN      was improved at 11 with  a creatinine of 0.73.  IV fluids have since      been discontinued.   1. Weakness/recurrent falls.  This was multifactorial, secondary to #s      1, 2, and 3, above against a background of multiple sclerosis.  The      patient's physical well-being improved during the course of this      hospitalization.  She was evaluated by PT/OT and has been      recommended rehabilitation in the inpatient rehab center or      alternatively, in a short-term skilled nursing facility.   1. Dysthyroidism.  The patient with a known history of hypothyroidism      and was on replacement therapy, pre-admission.  This has been      continued. TSH was found to be normal at 2.898.   1. Gastroesophageal reflux disease.  There were no problems referable      to this.  The patient was managed with proton pump inhibitor.   1. Anxiety.  The patient's mood remained stable during the course of      this hospitalization.   1. Oral thrush.  The patient was noted to have oral thrush, against      background of antibiotic treatment.  She had been managed for this,      with Nystatin oral suspension, with satisfactory clinical response.   1. Pruritic rash.  The patient was noted to have an area of pruritic      erythematous papules on the posterior aspect of the left calf, on      Nov 05, 2008.  This was addressed with topical Lotrimin cream, and      as of Nov 07, 2008, had considerably improved.   DISPOSITION:  The patient as of Nov 07, 2008, felt considerably better  and was clearly nearing discharge.  However, although she is married,  her husband is away at work for most of the day.  Therefore, she spends  most of the day alone at home and is currently too weak to effectively  manage on her own.  She has been evaluated by PT/OT as mentioned above,  and recommended a period of rehabilitation.  Rehab consultation has been  requested, and depending on their recommendations, the patient will be  discharged to  inpatient CIR or alternatively skilled nursing facility.  This is anticipated to occur on  or about Nov 08, 2008 or Nov 09, 2008.   DIET:  No restrictions.   ACTIVITIES:  As tolerated.  Otherwise, per PT/TO/rehab.   WOUND CARE:  Aquacel to the left lower extremity lesions.   FOLLOWUP INSTRUCTIONS:  The patient is to follow up with primary medical  doctor at [facility], following discharge from rehabilitation.  In  addition, the patient to follow up with her primary neurologist.      Isidor Holts, M.D.  Electronically Signed     CO/MEDQ  D:  11/07/2008  T:  11/07/2008  Job:  [id number]

## 2010-11-17 NOTE — Op Note (Signed)
NAME:  HANAAN, GANCARZ                       ACCOUNT NO.:  [id number]   MEDICAL RECORD NO.:  [id number]                   PATIENT TYPE:  INP   LOCATION:  0104                                 FACILITY:  [facility]   PHYSICIAN:  Leonides Grills, M.D.                  DATE OF BIRTH:  1950/09/29   DATE OF PROCEDURE:  12/27/2003  DATE OF DISCHARGE:                                 OPERATIVE REPORT   PREOPERATIVE DIAGNOSES:  1. Left trimalleolar ankle fracture.  2. Left distal tibiofibular syndesmotic rupture.   POSTOPERATIVE DIAGNOSES:  1. Left trimalleolar ankle fracture.  2. Left distal tibiofibular syndesmotic rupture.   OPERATION:  1. Open reduction, internal fixation, left trimalleolar ankle fracture     without posterior malleolar fixation.  2. Open reduction, internal fixation of left distal tibiofibular syndesmotic     rupture.  3. Stress x-rays, left ankle.   ANESTHESIA:  General endotracheal tube.   SURGEON:  Leonides Grills, M.D.   ASSISTANT:  Lianne Cure, P.A.-C.   ESTIMATED BLOOD LOSS:  Minimal.   TOURNIQUET TIME:  None.   COMPLICATIONS:  None.   DISPOSITION:  Stable to PAR.   INDICATION:  This is a 60 year old female, who sustained the above injury.  She was consented for the above procedure.  All risks which include  infection, neurovascular injury, nonunion, malunion, hardware rotation,  hardware failure, stiffness, arthritis, and possible fusion were all  explained.  Questions encouraged and answered.   DESCRIPTION OF OPERATION:  The patient was brought to the operating room and  placed in supine position.  After adequate general endotracheal tube  anesthesia was administered as well as vancomycin 500 mg IV piggyback, the  left lower extremity was then prepped and draped in a sterile manner.  A  bump was placed in the left ipsilateral hip.  The left lower extremity was  then prepped and draped in a sterile manner without a tourniquet.  A  longitudinal  incision was then made over the lateral malleolus.  Dissection  was carried down full-thickness to bone.  Fracture site was then  encountered.  A 5-hole one-third tubular plate was then chosen.  Due to the  fracture configuration as well as the fact that she is a new onset diabetic,  this not only was the lateral malleolus fixed but also the distal  tibiofibular syndesmosis as well.  Once the fracture site was anatomically  reduced and a 5-hole one-third tubular plate was applied, this was then  anatomically affixed with three 3.5 mm fully-threaded cortical screws.  Once  this was anatomically fixed and verified under C-arm guidance, AP lateral  Morse views, we then placed two 4.5 mm fully-threaded cortical set screws  proximally in the two most proximal holes.  The distal-most screw was then  removed, and a 4.5 mm fully-threaded cortical lag screw was then placed.  Once this was done, this was verified under C-arm guidance  to be in  anatomical position.  This was then backed out prior to the reduction and  fixation of the medial malleolus.  A longitudinal incision was then made  over the medial malleolus.  Dissection was carried out through skin;  hemostasis was obtained.  Soft tissue was then carefully dissected down to  the fracture site.  Fracture was then anatomically reduced and with a two-  point reduction clamp 3.5 mm fully-threaded cortical set screws were then  placed.  The distal-most screw on the lateral side was then advanced.  Stress x-rays were obtained in AP lateral Morse views and showed a stable  reduction without any evidence of loss of reduction or instability.  Fixation was in the proper position.  The wounds were copiously irrigated  with normal saline.  Subcu was closed with 2-0 Vicryl.  Skin was closed with  4-0 nylon.  Sterile dressing was applied.  Modified Jones dressing was  applied with ankle in neutral dorsiflexion.  Patient was stable to PAR.  We  did not fix  the posterior malleolus due to the fact that it was small and  did not cause any posterior instability to the ankle.                                               Leonides Grills, M.D.    PB/MEDQ  D:  12/27/2003  T:  12/27/2003  Job:  [id number]

## 2010-11-17 NOTE — Letter (Signed)
May 02, 2010   Drue Dun. Shanon Payor., MD  [address] [place], [place] [address]   Re:  [name], [name]             DOB:  05-Jun-1951   Dear Dr. Redmond School:   I saw the patient back today after we did Stanley fiberoptic bronchoscopy with  endobronchial ultrasound.  First of all, our cultures were all  essentially negative and there was no evidence of cytology of any  malignancy.  We did endobronchial ultrasounds of level IV as well as  level VII lymph nodes, and this showed reactive lymphoid tissue but no  evidence of malignancy, so I think her enlarged lymph nodes are reactive  and I think nothing else needs to be done at the present time except by  observation.  I will arrange to see her back in about 3 months and get Stanley  followup CT scan.  If I can be of any further help, please let me know.   Sincerely,   Ines Bloomer, M.D.  Electronically Signed   DPB/MEDQ  D:  05/02/2010  T:  05/03/2010  Job:  [id number]

## 2010-11-17 NOTE — Consult Note (Signed)
NAME:  [name], [name]                       ACCOUNT NO.:  [id number]   MEDICAL RECORD NO.:  [id number]                   PATIENT TYPE:  INP   LOCATION:  0104                                 FACILITY:  [facility]   PHYSICIAN:  Leonides Grills, M.D.                  DATE OF BIRTH:  05/22/1951   DATE OF CONSULTATION:  12/27/2003  DATE OF DISCHARGE:                                   CONSULTATION   EMERGENCY ROOM CONSULTATION:   CHIEF COMPLAINT:  Left ankle pain.   HISTORY:  This is a 60 year old female who tripped and fell and sustained a  left ankle fracture.  She has a history of MS and walks with a quad cane.  She has a social history of hypothyroidism, MS, and GERD.  She does not  smoke.  She does not require assistance for bathing or eating or ADL's and  she does not require assistance with mobilization.   ALLERGIES:  PENICILLIN.   PHYSICAL EXAMINATION:  Well nourished, well developed, no apparent distress,  very pleasant female.  Respirations 18.  She is obese.  She has obvious  swelling, no significant deformity to the left ankle.  Palpable dorsalis  pedis and posterior tibial pulse, sensation is intact to light touch, soft  leg and foot compartments.   X-RAYS:  AP and lateral views show trimalleolar ankle fracture, supination  exertional rotation type.   IMPRESSION:  Left trimalleolar ankle fracture, displaced.   PLAN:  I explained [name] at this point she will require open reduction  internal fixation of her trimalleolar ankle fracture without posterior  malleolar fixation.  We went over the risks which include infection, nerve  or vessel injury, nonunion, malunion, failure, stiffness, arthritis again  were all explained.  Questions were encouraged and answered.  We will  proceed with this in the near future.  We will keep her n.p.o. and she will  most likely require vancomycin IV piggyback prior to surgery.                                               Leonides Grills,  M.D.    PB/MEDQ  D:  12/27/2003  T:  12/27/2003  Job:  [id number]

## 2010-11-17 NOTE — H&P (Signed)
NAME:  [name], [name]                       ACCOUNT NO.:  [id number]   MEDICAL RECORD NO.:  [id number]                   PATIENT TYPE:  INP   LOCATION:  0447                                 FACILITY:  [facility]   PHYSICIAN:  Leonides Grills, M.D.                  DATE OF BIRTH:  September 21, 1950   DATE OF ADMISSION:  12/27/2003  DATE OF DISCHARGE:                                HISTORY & PHYSICAL   CHIEF COMPLAINT:  This is a 60 year old female with a history of MS with  left ankle pain.  She fell at home on December 27, 2003 and sustained obvious  deformity to left ankle.  She was brought in to [facility] emergency room  accompanied by her husband.   PAST MEDICAL HISTORY:  1. Multiple sclerosis.  2. Hypothyroidism.  3. Urinary incontinence.  4. Anxiety.  5. Chronic pain.   MEDICATIONS:  Include Synthroid, Prevacid, Ditropan, amitriptyline,  baclofen, Neurontin, meclizine, amantidine, Betaseron injectable.   ALLERGIES:  Include PENICILLIN.   PAST SURGICAL HISTORY:  1. Tonsillectomy.  2. Cholecystectomy.  3. Right wrist surgery secondary to injury.   She is a nonsmoker, nonalcohol use socially, and she lives with her husband.   REVIEW OF SYSTEMS:  She has no known seizure disorders, no chronic  headaches, no swallowing difficulties, no impaired vision, no hemoptysis.  She does have urinary incontinence.  No chronic diarrhea, no bloody stools,  no shortness of breath, no angina.  Musculoskeletal - she does have MS.   PHYSICAL EXAMINATION UPON ADMISSION:  VITAL SIGNS:  She had a temperature of  97, a pulse of 94, respirations 18, blood pressure 172/79.  GENERAL:  She is normocephalic, no acute distress, obese female.  NECK:  Supple to palpation, no masses noted.  CHEST:  Bilateral lungs clear to auscultation.  HEART:  Regular rate and rhythm.  ABDOMEN:  Soft, nontender to palpation.  SKIN:  Intact, no openings, no rashes noted.  EXTREMITIES:  She has general weakness in her extremities  secondary to MS.  Left ankle:  She has an obvious bony deformity with ecchymosis, edema.  No  erythema noted, no openings in the skin.   X-rays were taken.  X-rays demonstrate a trimalleolar left ankle fracture.   IMPRESSION:  1. Trimalleolar ankle fracture.  2. Hypothyroidism.  3. Multiple sclerosis.  4. Anxiety.   PLAN OF ACTION:  She is going to go to the OR and have open reduction  internal fixation of trimalleolar ankle fracture by Dr. Leonides Grills.  She  will then be admitted for pain and possible placement with rehab or home  health arrangements depending on circumstances and how well she does with  physical therapy.     Lianne Cure, P.A.                     Leonides Grills, M.D.    MC/MEDQ  D:  12/29/2003  T:  12/29/2003  Job:  [id number]

## 2010-11-17 NOTE — Discharge Summary (Signed)
NAME:  [name], [name]                       ACCOUNT NO.:  [id number]   MEDICAL RECORD NO.:  [id number]                   PATIENT TYPE:  INP   LOCATION:  0447                                 FACILITY:  [facility]   PHYSICIAN:  Leonides Grills, M.D.                  DATE OF BIRTH:  1950-08-19   DATE OF ADMISSION:  12/27/2003  DATE OF DISCHARGE:  12/31/2003                                 DISCHARGE SUMMARY   CHIEF COMPLAINT:  Left ankle pain.   HISTORY:  This is a 60 year old female with history of MS who slipped and  fell and sustained a left ankle fracture.  She was taken to [facility] ER  where x-rays were obtained and she was found to have an unstable  trimalleolar ankle fracture with distal tib-fib syndesmotic rupture.  She  was then taken to the OR that day and had open reduction internal fixation  of her distal tib-fib syndesmotic rupture.   PAST MEDICAL HISTORY:  Multiple sclerosis, hypothyroidism, urinary  incontinence, anxiety, and chronic pain.   MEDICATIONS:  Synthroid, Prevacid, Ditropan, amitriptyline, baclofen,  Neurontin, Meclizine, amantadine.   ALLERGY:  She has an allergy to PENICILLIN.   SURGERIES:  She has had tonsillectomy, cholecystectomy, and right wrist  surgery secondary to injury.   HABITS:  She is a nonsmoker.   PHYSICAL EXAMINATION AT THE TIME OF ADMISSION:  Please see H&P.   POSTOPERATIVE COURSE:  Patient was taken to the OR on the day of injury  which was December 27, 2003 and underwent open reduction internal fixation of  her trimalleolar ankle fracture without posterior malleolar fixation as well  as open reduction internal fixation of distal tib-fib syndesmotic rupture.  She has had an uncomplicated postoperative course.  She was not a rehab  candidate for rehab, has progressed with physical therapy inhouse and has  been a problem in reason why she is an extended stay due to disposition.  At  this point she is going to be discharged to nursing home type  facility for  extended care.   POSTADMISSION COURSE:  She is nonweightbearing on the left lower extremity.  She is to keep her splint clean, dry, and intact.  Active range of motion of  toes is encouraged.  She is encouraged to elevate this above level of the  heart.  She is to follow up in our office in 2 weeks postoperatively where  the splint will be removed as well as the sutures and a cast versus CAM  walker boot will be applied.  She will remain nonweightbearing for a total  of 3 months.                                               Leonides Grills, M.D.    PB/MEDQ  D:  12/31/2003  T:  12/31/2003  Job:  [id number]

## 2010-11-17 NOTE — Discharge Summary (Signed)
NAMEJERUSHA, [name]             ACCOUNT NO.:  [id number]   MEDICAL RECORD NO.:  [id number]          PATIENT TYPE:  INP   LOCATION:  1519                         FACILITY:  [facility]   PHYSICIAN:  Isidor Holts, M.D.  DATE OF BIRTH:  1951-06-26   DATE OF ADMISSION:  11/01/2008  DATE OF DISCHARGE:  11/10/2008                               DISCHARGE SUMMARY   ADDENDUM:   PRIMARY MEDICAL DOCTOR:  [facility].   For discharge diagnoses, medications, procedures and detailed clinical  course, refer to interim summary dictated Nov 07, 2008.  For the period  from Nov 08, 2008 to Nov 10, 2008, there were no new issues.  The  patient was seen by the rehabilitation team on Nov 09, 2008, approved  for comprehensive inpatient rehabilitation, and was cleared to be  discharged on that date, pending availability of bed at [facility].   DISPOSITION:  Bed became available at [facility] on Nov 10, 2008.  The patient  was therefore discharged on that date.  Discharge medications, disposition and follow up plans remained  unchanged.      Isidor Holts, M.D.  Electronically Signed     CO/MEDQ  D:  12/01/2008  T:  12/01/2008  Job:  [id number]   cc:   [facility]

## 2011-02-10 ENCOUNTER — Emergency Department (HOSPITAL_BASED_OUTPATIENT_CLINIC_OR_DEPARTMENT_OTHER)
Admission: EM | Admit: 2011-02-10 | Discharge: 2011-02-10 | Disposition: A | Discharge status: Home / Self Care | Payer: Federal, State, Local not specified - PPO | Attending: Emergency Medicine | Admitting: Emergency Medicine

## 2011-02-10 ENCOUNTER — Encounter: Payer: Self-pay | Admitting: Emergency Medicine

## 2011-02-10 DIAGNOSIS — E079 Disorder of thyroid, unspecified: Secondary | ICD-10-CM | POA: Insufficient documentation

## 2011-02-10 DIAGNOSIS — R109 Unspecified abdominal pain: Secondary | ICD-10-CM | POA: Insufficient documentation

## 2011-02-10 DIAGNOSIS — G35 Multiple sclerosis: Secondary | ICD-10-CM | POA: Insufficient documentation

## 2011-02-10 DIAGNOSIS — N390 Urinary tract infection, site not specified: Secondary | ICD-10-CM | POA: Insufficient documentation

## 2011-02-10 DIAGNOSIS — K219 Gastro-esophageal reflux disease without esophagitis: Secondary | ICD-10-CM | POA: Insufficient documentation

## 2011-02-10 HISTORY — DX: Disorder of thyroid, unspecified: E07.9

## 2011-02-10 HISTORY — DX: Gastro-esophageal reflux disease without esophagitis: K21.9

## 2011-02-10 HISTORY — DX: Multiple sclerosis: G35

## 2011-02-10 LAB — URINALYSIS, ROUTINE W REFLEX MICROSCOPIC
Glucose, UA: NEGATIVE mg/dL
Nitrite: POSITIVE — AB
Specific Gravity, Urine: 1.011 (ref 1.005–1.030)
pH: 6 (ref 5.0–8.0)

## 2011-02-10 LAB — URINE MICROSCOPIC-ADD ON

## 2011-02-10 MED ORDER — CEPHALEXIN 250 MG PO CAPS
500.0000 mg | ORAL_CAPSULE | Freq: Once | ORAL | Status: AC
  Administered 2011-02-10: 500 mg via ORAL
  Filled 2011-02-10: qty 2

## 2011-02-10 MED ORDER — CEPHALEXIN 500 MG PO CAPS: 500.0000 mg | ORAL_CAPSULE | Freq: Four times a day (QID) | ORAL | Status: AC

## 2011-02-10 NOTE — ED Provider Notes (Addendum)
History     CSN: [id number] Arrival date & time: 02/10/2011  3:12 PM  Chief Complaint  Patient presents with  . Groin Pain    pain for the past week from urinary catheter, burning   Patient is a 60 y.o. female presenting with dysuria.  Dysuria  This is a new problem. The current episode started more than 2 days ago (Approximately one week). Episode frequency: Constant, as the patient has a Foley catheter in place the 2 multiple sclerosis. The problem has been gradually worsening. The quality of the pain is described as aching (Suprapubic discomfort and cramping). The pain is mild. There has been no fever. She is not sexually active. Pertinent negatives include no chills, no sweats, no nausea, no vomiting, no hematuria and no flank pain. She has tried nothing for the symptoms. Past medical history comments: Multiple sclerosis which has caused bladder emptying problems for this patient and resulted in the need for her to have an indwelling Foley catheter.    Past Medical History  Diagnosis Date  . Urinary incontinence   . Urinary catheter in place     #18, 10 cc balloon  . Thyroid disease   . Multiple sclerosis   . GERD (gastroesophageal reflux disease)     Past Surgical History  Procedure Date  . Cholecystectomy   . Tonsillectomy   . Ankle surgery   . Wrist surgery   . Lymph gland excision     History reviewed. No pertinent family history.  History  Substance Use Topics  . Smoking status: Never Smoker   . Smokeless tobacco: Not on file  . Alcohol Use: No    OB History    Grav Para Term Preterm Abortions TAB SAB Ect Mult Living                  Review of Systems  Constitutional: Negative for fever, chills, activity change, appetite change and fatigue.  Respiratory: Negative.   Cardiovascular: Negative.   Gastrointestinal: Negative for nausea, vomiting, abdominal pain, diarrhea, constipation and abdominal distention.  Genitourinary: Positive for dysuria and pelvic  pain. Negative for hematuria, flank pain, decreased urine volume, difficulty urinating and genital sores.  Musculoskeletal: Negative for back pain.  Skin: Negative for rash.  Psychiatric/Behavioral: Negative.     Physical Exam  BP 152/95  Pulse 102  Temp(Src) 98.2 F (36.8 C) (Oral)  Resp 16  SpO2 95%  Physical Exam  Nursing note and vitals reviewed. Constitutional: She is oriented to person, place, and time. She appears well-nourished. No distress.  HENT:  Head: Normocephalic and atraumatic.  Mouth/Throat: Oropharynx is clear and moist.  Neck: Neck supple.  Cardiovascular: Normal rate, regular rhythm and normal heart sounds.  Exam reveals no gallop and no friction rub.   No murmur heard. Pulmonary/Chest: Effort normal and breath sounds normal. No respiratory distress. She has no wheezes. She has no rales.  Abdominal: Soft. Bowel sounds are normal. She exhibits no distension and no mass. There is tenderness. There is no rebound and no guarding.       Mild suprapubic tenderness to palpation  Musculoskeletal: She exhibits no edema and no tenderness.  Neurological: She is alert and oriented to person, place, and time.  Skin: Skin is warm and dry. No rash noted. She is not diaphoretic. No erythema.  Psychiatric: She has a normal mood and affect. Her behavior is normal. Judgment and thought content normal.    ED Course  Procedures  MDM Urinary tract infection, bladder  spasms   The patient's urine has been sent for culture, and the patient's Foley catheter has been changed by emergency department nurses. Keflex has been prescribed for urinary tract infection do to the patient's multiple allergies to different antibiotics.   Felisa Bonier, MD 02/10/11 1715  Felisa Bonier, MD 02/10/11 [phone]

## 2011-02-10 NOTE — ED Notes (Signed)
Per EMS:  pain for the past week from urinary catheter, burning

## 2011-02-11 LAB — URINE CULTURE
Colony Count: >=100000
Culture  Setup Time: 201208120210

## 2011-03-27 LAB — CBC
HCT: 31.9 — ABNORMAL LOW
HCT: 40.3
Hemoglobin: 10.7 — ABNORMAL LOW
Hemoglobin: 12.4
MCHC: 33.7
MCHC: 34.6
MCV: 85.9
MCV: 86.4
Platelets: 338
RBC: 3.68 — ABNORMAL LOW
RBC: 4.66
RDW: 15
WBC: 12.7 — ABNORMAL HIGH

## 2011-03-27 LAB — URINALYSIS, ROUTINE W REFLEX MICROSCOPIC
Bilirubin Urine: NEGATIVE
Bilirubin Urine: NEGATIVE
Glucose, UA: NEGATIVE
Ketones, ur: NEGATIVE
Nitrite: NEGATIVE
Nitrite: NEGATIVE
Specific Gravity, Urine: 1.007
Specific Gravity, Urine: 1.008
Urobilinogen, UA: 0.2
pH: 7.5

## 2011-03-27 LAB — URINE MICROSCOPIC-ADD ON

## 2011-03-27 LAB — PROTIME-INR: INR: 1.1

## 2011-03-27 LAB — DIFFERENTIAL
Basophils Absolute: 0
Basophils Relative: 0
Monocytes Absolute: 0.7
Neutro Abs: 10.7 — ABNORMAL HIGH

## 2011-03-27 LAB — URINE CULTURE
Colony Count: NO GROWTH
Culture: NO GROWTH

## 2011-03-27 LAB — BASIC METABOLIC PANEL
CO2: 23
Glucose, Bld: 111 — ABNORMAL HIGH
Potassium: 3.9
Sodium: 141

## 2011-03-27 LAB — COMPREHENSIVE METABOLIC PANEL
Albumin: 3.5
Alkaline Phosphatase: 116
BUN: 16
CO2: 30
Chloride: 101
Creatinine, Ser: 0.9
GFR calc non Af Amer: >60
Glucose, Bld: 99
Potassium: 3.3 — ABNORMAL LOW
Total Bilirubin: 1.2

## 2011-06-11 ENCOUNTER — Other Ambulatory Visit: Payer: Self-pay | Admitting: Urology

## 2011-06-18 ENCOUNTER — Encounter (HOSPITAL_COMMUNITY): Payer: Self-pay | Admitting: Pharmacy Technician

## 2011-06-20 ENCOUNTER — Emergency Department (HOSPITAL_BASED_OUTPATIENT_CLINIC_OR_DEPARTMENT_OTHER)
Admission: EM | Admit: 2011-06-20 | Discharge: 2011-06-20 | Disposition: A | Discharge status: Home / Self Care | Payer: Medicare Other | Attending: Emergency Medicine | Admitting: Emergency Medicine

## 2011-06-20 ENCOUNTER — Encounter (HOSPITAL_BASED_OUTPATIENT_CLINIC_OR_DEPARTMENT_OTHER): Payer: Self-pay | Admitting: *Deleted

## 2011-06-20 DIAGNOSIS — G35 Multiple sclerosis: Secondary | ICD-10-CM | POA: Insufficient documentation

## 2011-06-20 DIAGNOSIS — K219 Gastro-esophageal reflux disease without esophagitis: Secondary | ICD-10-CM | POA: Insufficient documentation

## 2011-06-20 DIAGNOSIS — M549 Dorsalgia, unspecified: Secondary | ICD-10-CM | POA: Insufficient documentation

## 2011-06-20 DIAGNOSIS — Z79899 Other long term (current) drug therapy: Secondary | ICD-10-CM | POA: Insufficient documentation

## 2011-06-20 DIAGNOSIS — N390 Urinary tract infection, site not specified: Secondary | ICD-10-CM | POA: Insufficient documentation

## 2011-06-20 DIAGNOSIS — E079 Disorder of thyroid, unspecified: Secondary | ICD-10-CM | POA: Insufficient documentation

## 2011-06-20 LAB — URINALYSIS, ROUTINE W REFLEX MICROSCOPIC
Ketones, ur: NEGATIVE mg/dL
Protein, ur: NEGATIVE mg/dL
Specific Gravity, Urine: 1.009 (ref 1.005–1.030)
pH: 7 (ref 5.0–8.0)

## 2011-06-20 LAB — URINE MICROSCOPIC-ADD ON

## 2011-06-20 MED ORDER — SULFAMETHOXAZOLE-TRIMETHOPRIM 800-160 MG PO TABS: 1.0000 | ORAL_TABLET | Freq: Two times a day (BID) | ORAL | Status: AC

## 2011-06-20 NOTE — ED Notes (Signed)
[facility] called to transport pt home 

## 2011-06-20 NOTE — ED Provider Notes (Signed)
History     CSN: [id number] Arrival date & time: 06/20/2011  7:00 PM   First MD Initiated Contact with Patient 06/20/11 1923      Chief Complaint  Patient presents with  . Back Pain    (Consider location/radiation/quality/duration/timing/severity/associated sxs/prior treatment) HPI Comments: Pt states that she is having bladder pressure as well:pt states that she has a foley and it has had cloudy urine:pt denies fever or n/v:pt states that the foley was placed about 10 days ago  Patient is a 60 y.o. female presenting with back pain. The history is provided by the patient. No language interpreter was used.  Back Pain  This is a new problem. The current episode started 2 days ago. The problem occurs constantly. The problem has not changed since onset.The pain is associated with no known injury. The pain is present in the lumbar spine. The quality of the pain is described as aching. The pain is moderate. The pain is the same all the time.    Past Medical History  Diagnosis Date  . Urinary incontinence   . Urinary catheter in place     #18, 10 cc balloon  . Thyroid disease   . Multiple sclerosis   . GERD (gastroesophageal reflux disease)     Past Surgical History  Procedure Date  . Cholecystectomy   . Tonsillectomy   . Ankle surgery   . Wrist surgery   . Lymph gland excision     No family history on file.  History  Substance Use Topics  . Smoking status: Never Smoker   . Smokeless tobacco: Not on file  . Alcohol Use: No    OB History    Grav Para Term Preterm Abortions TAB SAB Ect Mult Living                  Review of Systems  Musculoskeletal: Positive for back pain.  All other systems reviewed and are negative.    Allergies  Avelox; Ciprofloxacin hcl; Penicillins; and Shrimp  Home Medications   Current Outpatient Rx  Name Route Sig Dispense Refill  . ACETAMINOPHEN 500 MG PO TABS Oral Take 1,000 mg by mouth 2 (two) times daily as needed. For pain       . ALPRAZOLAM 0.5 MG PO TABS Oral Take 0.5 mg by mouth 3 (three) times daily as needed. For anxiety     . ASPIRIN 81 MG PO TABS Oral Take 81 mg by mouth at bedtime.     . ATORVASTATIN CALCIUM 20 MG PO TABS Oral Take 20 mg by mouth at bedtime.      Marland Kitchen BACLOFEN 20 MG PO TABS Oral Take 20 mg by mouth 4 (four) times daily.      Marland Kitchen CALCIUM CARBONATE 600 MG PO TABS Oral Take 600 mg by mouth 2 (two) times daily.      Marland Kitchen CALCIUM CARBONATE ANTACID 500 MG PO CHEW Oral Chew 1 tablet by mouth once as needed. For heart burn     . DARIFENACIN HYDROBROMIDE ER 7.5 MG PO TB24 Oral Take 7.5 mg by mouth 2 (two) times daily.      Marland Kitchen ESOMEPRAZOLE MAGNESIUM 40 MG PO CPDR Oral Take 40 mg by mouth daily.      . OMEGA-3 FATTY ACIDS 1000 MG PO CAPS Oral Take 1 g by mouth daily.      . FUROSEMIDE 20 MG PO TABS Oral Take 20 mg by mouth daily at 12 noon.      Marland Kitchen GABAPENTIN  400 MG PO CAPS Oral Take 800 mg by mouth 4 (four) times daily.      . DUODERM HYDROACTIVE EX Apply externally Apply 1 patch topically as needed. For sores     . IBUPROFEN 800 MG PO TABS Oral Take 800 mg by mouth 2 (two) times daily as needed. For pain     . LEVOTHYROXINE SODIUM 88 MCG PO TABS Oral Take 88 mcg by mouth every morning.      . MULTI-VITAMIN/MINERALS PO TABS Oral Take 1 tablet by mouth daily.      Marland Kitchen POLYETHYLENE GLYCOL 3350 PO PACK Oral Take 17 g by mouth daily.      Marland Kitchen PRESCRIPTION MEDICATION Topical Apply 1 application topically daily. Barrier cream     . SENNA-DOCUSATE SODIUM 8.6-50 MG PO TABS Oral Take 1 tablet by mouth daily.      . SERTRALINE HCL 25 MG PO TABS Oral Take 25 mg by mouth every morning.      . TETRAHYDROZOLINE HCL 0.05 % OP SOLN Both Eyes Place 1 drop into both eyes 2 (two) times daily as needed. DRY EYES      . VITAMIN C 500 MG PO TABS Oral Take 500-1,000 mg by mouth daily.      Marland Kitchen VITAMIN D 400 UNITS PO TABS Oral Take 400 Units by mouth 2 (two) times daily.        BP 175/81  Pulse 88  Temp(Src) 98.5 F (36.9 C) (Oral)   Resp 20  SpO2 97%  Physical Exam  Nursing note and vitals reviewed. Constitutional: She is oriented to person, place, and time. She appears well-developed and well-nourished.  HENT:  Head: Normocephalic and atraumatic.  Cardiovascular: Normal rate and regular rhythm.   Pulmonary/Chest: Effort normal and breath sounds normal.  Abdominal: Soft. There is tenderness in the suprapubic area.  Musculoskeletal: Normal range of motion.  Neurological: She is alert and oriented to person, place, and time.  Skin: Skin is warm and dry.  Psychiatric: She has a normal mood and affect.    ED Course  Procedures (including critical care time)  Labs Reviewed  URINALYSIS, ROUTINE W REFLEX MICROSCOPIC - Abnormal; Notable for the following:    Hgb urine dipstick LARGE (*)    Leukocytes, UA MODERATE (*)    All other components within normal limits  URINE MICROSCOPIC-ADD ON - Abnormal; Notable for the following:    Bacteria, UA FEW (*)    All other components within normal limits  URINE CULTURE   No results found.   1. UTI (lower urinary tract infection)       MDM No consistent with a pyelo:will treat pt:pt foley changed in ZO:XWRUE sent for cx    Medical screening examination/treatment/procedure(s) were performed by non-physician practitioner and as supervising physician I was immediately available for consultation/collaboration. Osvaldo Human, M.D.     Teressa Lower, NP 06/20/11 2055  Carleene Cooper III, MD 06/21/11 [phone]

## 2011-06-20 NOTE — ED Notes (Signed)
Pt sts she has had lower back pain and bladder pain x2 days. Pt has foley in place and output smells foul and appears very cloudy.

## 2011-06-20 NOTE — ED Notes (Signed)
Patient presented to the ED with existing catheter, 16Fr.  Catheter removed; peri area cleaned and dried prior to the placement of new catheter.

## 2011-06-21 ENCOUNTER — Encounter (HOSPITAL_COMMUNITY): Payer: Self-pay | Admitting: *Deleted

## 2011-06-23 LAB — URINE CULTURE: Colony Count: >=100000

## 2011-06-29 ENCOUNTER — Encounter (HOSPITAL_COMMUNITY): Payer: Self-pay | Admitting: *Deleted

## 2011-06-29 NOTE — H&P (Signed)
Chief Complaint   cc: Dr. Florentina Jenny cc: Dr. Mickie Kay, [place]. [place]   Active Problems Problems  1. Acute Urinary Retention 788.20 2. Multiple Sclerosis 340 3. Urge And Stress Incontinence 788.33 4. 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 She has a foley cath in place which is changed q 1 month per home health, and has had several recent UTI, treated by Dr. Redmond School ( no records available). She complains of urinary incontinence without sensory awareness, and pelvic spasm, and incontinence.  Records reviewed from [facility]. No notes re: catheter or incontnence. Pt has a medical POA: brother. However he doesn't sign her permits. She is seen with her nurse, who accompanies her everywhere. She complains of painful foley, especiallyh in the sitting position, with bladder spasms, and leakage around the catheter.   Past Medical History Problems  1. History of  Anxiety (Symptom) 300.00 2. History of  Arthritis V13.4 3. History of  Depression 311 4. History of  Esophageal Reflux 530.81 5. History of  Hypothyroidism 244.9 6. History of  Multiple Sclerosis 340  Surgical History Problems  1. History of  Ankle Surgery 2. History of  Tonsillectomy 3. History of  Wrist Surgery  Current Meds 1. Acetaminophen 500 MG Oral Tablet; Therapy: (Recorded:30Oct2012) to 2. ALPRAZolam TABS; Therapy: (Recorded:30Oct2012) to 3. Aspirin 81 MG Oral Tablet; Therapy: (Recorded:30Oct2012) to 4. Baclofen TABS; Therapy: (Recorded:30Oct2012) to 5. Calcium Carbonate-Vitamin D 600-400 MG-UNIT Oral Tablet; Therapy: (Recorded:30Oct2012) to 6. Enablex 7.5 MG Oral Tablet Extended Release 24 Hour; Therapy: (Recorded:30Oct2012) to 7. Gabapentin 800 MG Oral Tablet; Therapy: (Recorded:30Oct2012) to 8. Ibuprofen 800 MG Oral Tablet; Therapy:  (Recorded:30Oct2012) to 9. Levothyroxine Sodium 100 MCG Oral Tablet; Therapy: (Recorded:30Oct2012) to 10. Monistat 7 CREA; Therapy: (Recorded:30Oct2012) to 11. Multi-Day Vitamins TABS; Therapy: (Recorded:30Oct2012) to 12. NexIUM 40 MG Oral Capsule Delayed Release; Therapy: (Recorded:30Oct2012) to 13. Provigil TABS; Therapy: (Recorded:30Oct2012) to 14. Vitamin C TABS; Therapy: (Recorded:30Oct2012) to 15. Vitamin D TABS; Therapy: (Recorded:30Oct2012) to  Allergies Medication  1. Avelox TABS 2. Cipro TABS 3. Penicillins  Family History Problems  1. Paternal history of  Death In The Family Mother 3 yo COPD 2. Paternal history of  Family Health Status - Father's Age 10. Paternal history of  Hypertension V17.49 4. Paternal history of  Type 2 Diabetes Mellitus 5. Paternal history of  Urinary Calculus  Social History Problems  1. Marital History - Currently Married 2. Never A Smoker 3. Physical Disability Affecting Ability To Work Denied  4. History of  Alcohol Use 5. History of  Caffeine Use  Review of Systems  Bedridden, chronic foley. Poor eye-hand coordination.     Physical Exam Constitutional:. No acute distress. The patient appears well hydrated.  ENT:. The ears and nose are normal in appearance.  Neck: The appearance of the neck is normal and no neck mass is present.  Genitourinary:. Urthral foley in good position. Examination of the external genitalia shows normal female external genitalia and no lesions. The urethra is normal in appearance and not tender. There is no urethral mass. Vaginal exam demonstrates atrophy, but no abnormalities. No cystocele is identified. No enterocele is identified. No rectocele is identified. The adnexa are palpably normal. The bladder is non tender and not distended. The anus is normal on inspection. The perineum is normal on inspection.  Skin: Normal skin turgor and  normal skin color and pigmentation.    Assessment Assessed  1. Multiple  Sclerosis 340 2. Urge And Stress Incontinence 788.33 3. 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 She is referred by [facility] for consideration of  S-P tube. ( 40 monutes)   Plan  OK for s-p tube. She will discuss with her brother. In addition, she understands that she may still have problems with spasms, and some incontnence,but she is willing to take a chance. She will need to have the catheter changed q 1 month.

## 2011-07-02 ENCOUNTER — Observation Stay (HOSPITAL_COMMUNITY)
Admission: RE | Admit: 2011-07-02 | Discharge: 2011-07-03 | Disposition: A | Discharge status: Home / Self Care | Payer: Medicare Other | Source: Ambulatory Visit | Attending: Urology | Admitting: Urology

## 2011-07-02 ENCOUNTER — Other Ambulatory Visit: Payer: Self-pay

## 2011-07-02 ENCOUNTER — Encounter (HOSPITAL_COMMUNITY): Payer: Self-pay | Admitting: *Deleted

## 2011-07-02 ENCOUNTER — Encounter (HOSPITAL_COMMUNITY)
Admission: RE | Disposition: A | Discharge status: Home / Self Care | Payer: Self-pay | Source: Ambulatory Visit | Attending: Urology

## 2011-07-02 ENCOUNTER — Encounter (HOSPITAL_COMMUNITY): Payer: Self-pay | Admitting: Anesthesiology

## 2011-07-02 ENCOUNTER — Ambulatory Visit (HOSPITAL_COMMUNITY): Payer: Medicare Other | Admitting: Anesthesiology

## 2011-07-02 ENCOUNTER — Ambulatory Visit (HOSPITAL_COMMUNITY): Payer: Medicare Other

## 2011-07-02 DIAGNOSIS — Y838 Other surgical procedures as the cause of abnormal reaction of the patient, or of later complication, without mention of misadventure at the time of the procedure: Secondary | ICD-10-CM | POA: Insufficient documentation

## 2011-07-02 DIAGNOSIS — R339 Retention of urine, unspecified: Secondary | ICD-10-CM | POA: Insufficient documentation

## 2011-07-02 DIAGNOSIS — IMO0002 Reserved for concepts with insufficient information to code with codable children: Secondary | ICD-10-CM | POA: Insufficient documentation

## 2011-07-02 DIAGNOSIS — Z79899 Other long term (current) drug therapy: Secondary | ICD-10-CM | POA: Insufficient documentation

## 2011-07-02 DIAGNOSIS — N3946 Mixed incontinence: Principal | ICD-10-CM | POA: Insufficient documentation

## 2011-07-02 DIAGNOSIS — K219 Gastro-esophageal reflux disease without esophagitis: Secondary | ICD-10-CM | POA: Insufficient documentation

## 2011-07-02 DIAGNOSIS — G35 Multiple sclerosis: Secondary | ICD-10-CM | POA: Insufficient documentation

## 2011-07-02 DIAGNOSIS — E039 Hypothyroidism, unspecified: Secondary | ICD-10-CM | POA: Insufficient documentation

## 2011-07-02 DIAGNOSIS — N3942 Incontinence without sensory awareness: Secondary | ICD-10-CM | POA: Insufficient documentation

## 2011-07-02 HISTORY — DX: Unspecified osteoarthritis, unspecified site: M19.90

## 2011-07-02 HISTORY — DX: Anxiety disorder, unspecified: F41.9

## 2011-07-02 HISTORY — DX: Hypothyroidism, unspecified: E03.9

## 2011-07-02 HISTORY — DX: Cellulitis, unspecified: L03.90

## 2011-07-02 HISTORY — PX: CYSTOSCOPY: SHX5120

## 2011-07-02 HISTORY — DX: Myoneural disorder, unspecified: G70.9

## 2011-07-02 HISTORY — DX: Blindness, one eye, unspecified eye: H54.40

## 2011-07-02 LAB — BASIC METABOLIC PANEL
BUN: 15 mg/dL (ref 6–23)
Calcium: 9.9 mg/dL (ref 8.4–10.5)
Creatinine, Ser: 0.63 mg/dL (ref 0.50–1.10)
GFR calc non Af Amer: >90 mL/min (ref >90)
Glucose, Bld: 100 mg/dL — ABNORMAL HIGH (ref 70–99)

## 2011-07-02 LAB — CBC
HCT: 40.2 % (ref 36.0–46.0)
Hemoglobin: 13.2 g/dL (ref 12.0–15.0)
MCH: 30.6 pg (ref 26.0–34.0)
MCHC: 32.8 g/dL (ref 30.0–36.0)
RDW: 13.4 % (ref 11.5–15.5)

## 2011-07-02 SURGERY — CYSTOSCOPY
Anesthesia: General | Site: Bladder | Wound class: Clean Contaminated

## 2011-07-02 MED ORDER — FENTANYL CITRATE 0.05 MG/ML IJ SOLN: 25.0000 ug | INTRAMUSCULAR | Status: DC | PRN

## 2011-07-02 MED ORDER — BACLOFEN 20 MG PO TABS
20.0000 mg | ORAL_TABLET | Freq: Four times a day (QID) | ORAL | Status: DC
  Administered 2011-07-02 – 2011-07-03 (×4): 20 mg via ORAL
  Filled 2011-07-02 (×6): qty 1

## 2011-07-02 MED ORDER — GABAPENTIN 400 MG PO CAPS
800.0000 mg | ORAL_CAPSULE | Freq: Four times a day (QID) | ORAL | Status: DC
  Administered 2011-07-02 – 2011-07-03 (×3): 800 mg via ORAL
  Filled 2011-07-02 (×6): qty 2

## 2011-07-02 MED ORDER — MEPERIDINE HCL 50 MG/ML IJ SOLN: 6.2500 mg | INTRAMUSCULAR | Status: DC | PRN

## 2011-07-02 MED ORDER — ACETAMINOPHEN 10 MG/ML IV SOLN
INTRAVENOUS | Status: AC
  Filled 2011-07-02: qty 100

## 2011-07-02 MED ORDER — SIMVASTATIN 20 MG PO TABS
20.0000 mg | ORAL_TABLET | Freq: Every day | ORAL | Status: DC
  Administered 2011-07-02 – 2011-07-03 (×2): 20 mg via ORAL
  Filled 2011-07-02 (×2): qty 1

## 2011-07-02 MED ORDER — VITAMIN C 500 MG PO TABS
500.0000 mg | ORAL_TABLET | Freq: Every day | ORAL | Status: DC
  Administered 2011-07-02 – 2011-07-03 (×2): 1000 mg via ORAL
  Filled 2011-07-02 (×2): qty 2

## 2011-07-02 MED ORDER — CALCIUM CARBONATE 1250 (500 CA) MG PO TABS
1.0000 | ORAL_TABLET | Freq: Two times a day (BID) | ORAL | Status: DC
  Administered 2011-07-02 – 2011-07-03 (×2): 500 mg via ORAL
  Filled 2011-07-02 (×3): qty 1

## 2011-07-02 MED ORDER — LACTATED RINGERS IV SOLN: INTRAVENOUS | Status: DC | PRN

## 2011-07-02 MED ORDER — SERTRALINE HCL 25 MG PO TABS
25.0000 mg | ORAL_TABLET | Freq: Every day | ORAL | Status: DC
  Administered 2011-07-03: 25 mg via ORAL
  Filled 2011-07-02: qty 1

## 2011-07-02 MED ORDER — DEXTROSE-NACL 5-0.45 % IV SOLN
INTRAVENOUS | Status: DC
  Administered 2011-07-02: via INTRAVENOUS

## 2011-07-02 MED ORDER — POLYETHYLENE GLYCOL 3350 17 G PO PACK
17.0000 g | PACK | Freq: Every day | ORAL | Status: DC
  Administered 2011-07-03: 17 g via ORAL
  Filled 2011-07-02 (×2): qty 1

## 2011-07-02 MED ORDER — SENNOSIDES-DOCUSATE SODIUM 8.6-50 MG PO TABS
1.0000 | ORAL_TABLET | Freq: Every day | ORAL | Status: DC
  Administered 2011-07-03: 1 via ORAL
  Filled 2011-07-02 (×2): qty 1

## 2011-07-02 MED ORDER — PANTOPRAZOLE SODIUM 40 MG PO TBEC
40.0000 mg | DELAYED_RELEASE_TABLET | Freq: Every day | ORAL | Status: DC
  Administered 2011-07-02 – 2011-07-03 (×2): 40 mg via ORAL
  Filled 2011-07-02 (×2): qty 1

## 2011-07-02 MED ORDER — ONDANSETRON HCL 4 MG/2ML IJ SOLN
INTRAMUSCULAR | Status: DC | PRN
  Administered 2011-07-02: 4 mg via INTRAVENOUS

## 2011-07-02 MED ORDER — ACETAMINOPHEN 10 MG/ML IV SOLN
INTRAVENOUS | Status: DC | PRN
  Administered 2011-07-02: 1000 mg via INTRAVENOUS

## 2011-07-02 MED ORDER — BELLADONNA ALKALOIDS-OPIUM 16.2-60 MG RE SUPP
RECTAL | Status: DC | PRN
  Administered 2011-07-02: 1 via RECTAL

## 2011-07-02 MED ORDER — LIDOCAINE HCL (CARDIAC) 20 MG/ML IV SOLN
INTRAVENOUS | Status: DC | PRN
  Administered 2011-07-02: 70 mg via INTRAVENOUS

## 2011-07-02 MED ORDER — LACTATED RINGERS IV SOLN
INTRAVENOUS | Status: DC
  Administered 2011-07-02: 1000 mL via INTRAVENOUS
  Administered 2011-07-02: 13:00:00 via INTRAVENOUS

## 2011-07-02 MED ORDER — FUROSEMIDE 20 MG PO TABS
20.0000 mg | ORAL_TABLET | Freq: Every day | ORAL | Status: DC
  Administered 2011-07-02 – 2011-07-03 (×2): 20 mg via ORAL
  Filled 2011-07-02 (×2): qty 1

## 2011-07-02 MED ORDER — SODIUM CHLORIDE 0.9 % IR SOLN
Status: DC | PRN
  Administered 2011-07-02: 3000 mL

## 2011-07-02 MED ORDER — DUODERM HYDROACTIVE EX MISC: Freq: Two times a day (BID) | CUTANEOUS | Status: DC

## 2011-07-02 MED ORDER — LEVOTHYROXINE SODIUM 88 MCG PO TABS
88.0000 ug | ORAL_TABLET | Freq: Every day | ORAL | Status: DC
  Administered 2011-07-03: 88 ug via ORAL
  Filled 2011-07-02: qty 1

## 2011-07-02 MED ORDER — ALPRAZOLAM 0.5 MG PO TABS
0.5000 mg | ORAL_TABLET | Freq: Three times a day (TID) | ORAL | Status: DC | PRN
  Administered 2011-07-02: 0.5 mg via ORAL
  Filled 2011-07-02: qty 1

## 2011-07-02 MED ORDER — DARIFENACIN HYDROBROMIDE ER 7.5 MG PO TB24
7.5000 mg | ORAL_TABLET | Freq: Two times a day (BID) | ORAL | Status: DC
  Administered 2011-07-02 – 2011-07-03 (×2): 7.5 mg via ORAL
  Filled 2011-07-02 (×3): qty 1

## 2011-07-02 MED ORDER — BELLADONNA ALKALOIDS-OPIUM 16.2-60 MG RE SUPP
RECTAL | Status: AC
  Filled 2011-07-02: qty 1

## 2011-07-02 MED ORDER — CALCIUM CARBONATE 600 MG PO TABS
600.0000 mg | ORAL_TABLET | Freq: Two times a day (BID) | ORAL | Status: DC
  Filled 2011-07-02 (×4): qty 1

## 2011-07-02 MED ORDER — PROMETHAZINE HCL 25 MG/ML IJ SOLN: 6.2500 mg | INTRAMUSCULAR | Status: DC | PRN

## 2011-07-02 MED ORDER — PROPOFOL 10 MG/ML IV EMUL
INTRAVENOUS | Status: DC | PRN
  Administered 2011-07-02: 150 mg via INTRAVENOUS
  Administered 2011-07-02: 10 mg via INTRAVENOUS
  Administered 2011-07-02 (×2): 20 mg via INTRAVENOUS
  Administered 2011-07-02 (×5): 10 mg via INTRAVENOUS

## 2011-07-02 MED ORDER — GENTAMICIN IN SALINE 1.6-0.9 MG/ML-% IV SOLN
80.0000 mg | INTRAVENOUS | Status: AC
  Administered 2011-07-02: 80 mg via INTRAVENOUS
  Filled 2011-07-02: qty 50

## 2011-07-02 MED ORDER — LACTATED RINGERS IV SOLN: INTRAVENOUS | Status: DC

## 2011-07-02 MED ORDER — POVIDONE-IODINE 7.5 % EX SOLN: Freq: Once | CUTANEOUS | Status: DC

## 2011-07-02 MED ORDER — NAPHAZOLINE HCL 0.1 % OP SOLN
1.0000 [drp] | Freq: Four times a day (QID) | OPHTHALMIC | Status: DC | PRN
  Filled 2011-07-02: qty 15

## 2011-07-02 MED ORDER — FENTANYL CITRATE 0.05 MG/ML IJ SOLN
INTRAMUSCULAR | Status: DC | PRN
  Administered 2011-07-02 (×2): 25 ug via INTRAVENOUS
  Administered 2011-07-02: 50 ug via INTRAVENOUS

## 2011-07-02 MED ORDER — HYDROCODONE-ACETAMINOPHEN 7.5-650 MG PO TABS: 1.0000 | ORAL_TABLET | Freq: Four times a day (QID) | ORAL | Status: AC | PRN

## 2011-07-02 MED ORDER — DOXYCYCLINE HYCLATE 100 MG PO TABS
100.0000 mg | ORAL_TABLET | Freq: Two times a day (BID) | ORAL | Status: DC
  Administered 2011-07-02 – 2011-07-03 (×2): 100 mg via ORAL
  Filled 2011-07-02 (×3): qty 1

## 2011-07-02 MED ORDER — TRIMETHOPRIM 100 MG PO TABS: 100.0000 mg | ORAL_TABLET | ORAL | Status: AC

## 2011-07-02 MED ORDER — MIDAZOLAM HCL 5 MG/5ML IJ SOLN
INTRAMUSCULAR | Status: DC | PRN
  Administered 2011-07-02 (×2): 2 mg via INTRAVENOUS

## 2011-07-02 SURGICAL SUPPLY — 36 items
BAG URINE DRAINAGE (UROLOGICAL SUPPLIES) ×3 IMPLANT
BAG URINE LEG 500ML (DRAIN) ×1 IMPLANT
BAG URO CATCHER STRL LF (DRAPE) ×3 IMPLANT
BLADE SURG 15 STRL LF DISP TIS (BLADE) ×1 IMPLANT
BLADE SURG 15 STRL SS (BLADE) ×2
CATH HEMA 3WAY 30CC 22FR COUDE (CATHETERS) ×1 IMPLANT
CATH ROBINSON RED A/P 16FR (CATHETERS) IMPLANT
CATH SILASTIC FOLEY 20FRX5CC (CATHETERS) ×1 IMPLANT
CLOTH BEACON ORANGE TIMEOUT ST (SAFETY) ×3 IMPLANT
COVER SURGICAL LIGHT HANDLE (MISCELLANEOUS) ×2 IMPLANT
DRAPE CAMERA CLOSED 9X96 (DRAPES) ×2 IMPLANT
ELECT REM PT RETURN 9FT ADLT (ELECTROSURGICAL)
ELECTRODE REM PT RTRN 9FT ADLT (ELECTROSURGICAL) ×1 IMPLANT
GAUZE SPONGE 4X4 12PLY STRL LF (GAUZE/BANDAGES/DRESSINGS) ×1 IMPLANT
GLOVE BIOGEL M STRL SZ7.5 (GLOVE) ×2 IMPLANT
GLOVE SURG SS PI 8.0 STRL IVOR (GLOVE) ×1 IMPLANT
GOWN STRL NON-REIN LRG LVL3 (GOWN DISPOSABLE) ×2 IMPLANT
GOWN STRL REIN XL XLG (GOWN DISPOSABLE) ×3 IMPLANT
IV NS IRRIG 3000ML ARTHROMATIC (IV SOLUTION) ×6 IMPLANT
KIT SUPRAPUBIC CATH (MISCELLANEOUS) ×1 IMPLANT
MANIFOLD NEPTUNE II (INSTRUMENTS) ×3 IMPLANT
NDL SPNL 22GX7 QUINCKE BK (NEEDLE) IMPLANT
NEEDLE HYPO 22GX1.5 SAFETY (NEEDLE) IMPLANT
NEEDLE SPNL 22GX7 QUINCKE BK (NEEDLE) ×2 IMPLANT
NS IRRIG 1000ML POUR BTL (IV SOLUTION) ×2 IMPLANT
PACK CYSTO (CUSTOM PROCEDURE TRAY) ×3 IMPLANT
PENCIL BUTTON HOLSTER BLD 10FT (ELECTRODE) IMPLANT
PLUG CATH AND CAP STER (CATHETERS) ×1 IMPLANT
SUT ETHILON 3 0 PS 1 (SUTURE) ×1 IMPLANT
SUT VIC AB 0 CT1 36 (SUTURE) ×2 IMPLANT
SUT VIC AB 3-0 PS2 18 (SUTURE) ×2
SUT VIC AB 3-0 PS2 18XBRD (SUTURE) IMPLANT
SYRINGE IRR TOOMEY STRL 70CC (SYRINGE) ×1 IMPLANT
TOWEL OR 17X26 10 PK STRL BLUE (TOWEL DISPOSABLE) ×2 IMPLANT
TUBING CONNECTING 10 (TUBING) ×3 IMPLANT
WATER STERILE IRR 3000ML UROMA (IV SOLUTION) ×1 IMPLANT

## 2011-07-02 NOTE — Progress Notes (Signed)
Hgb. And Hct. Drawn by lab. 

## 2011-07-02 NOTE — Anesthesia Postprocedure Evaluation (Signed)
  Anesthesia Post-op Note  Patient: [name]  Procedure(s) Performed:  CYSTOSCOPY; INSERTION OF SUPRAPUBIC CATHETER - Insertion of Suprapubic Tube  Patient Location: PACU  Anesthesia Type: General  Level of Consciousness: awake and alert   Airway and Oxygen Therapy: Patient Spontanous Breathing  Post-op Pain: mild  Post-op Assessment: Post-op Vital signs reviewed, Patient's Cardiovascular Status Stable, Respiratory Function Stable, Patent Airway and No signs of Nausea or vomiting  Post-op Vital Signs: stable  Complications: No apparent anesthesia complications

## 2011-07-02 NOTE — Interval H&P Note (Signed)
History and Physical Interval Note:  01/22/2011 11:06 AM  [name]  has presented today for surgery, with the diagnosis of Multiple Sclerosis, Neurogenic Bladder  The various methods of treatment have been discussed with the patient and family. After consideration of risks, benefits and other options for treatment, the patient has consented to  Procedure(s): CYSTOSCOPY INSERTION OF SUPRAPUBIC CATHETER as a surgical intervention .  The patients' history has been reviewed, patient examined, no change in status, stable for surgery.  I have reviewed the patients' chart and labs.  Questions were answered to the patient's satisfaction.     Jethro Bolus I

## 2011-07-02 NOTE — Progress Notes (Signed)
HGB.- 12.0- Hct. 36.3- results noted

## 2011-07-02 NOTE — Op Note (Signed)
Pre-operative diagnosis : Post operative bleeding, post op suprapubic catheterization.  Postoperative diagnosis:Same  Operation: Cysto, clot evacuation, foley catheterization  Surgeon:  S. Patsi Sears, MD  Anesthesia: IV sedation  Preparation: With the pt in the lithotomy posiiton, the pubis was prepped and draped in the usual fashion.   Review history: Pt is post op suprapubic catheterization. Bleeding was noted and controlled with  Sutures at the suprabubic site. However , pt was noted to bleed from her bladder and urethra, necessitating repeat cystoscopy and clot evacuation.   Statement of  Likelihood of Success: Excellent. TIME-OUT observed.:  Procedure: Cystoscopy accomplished and clot evacuated. 24 3-way catheter inserted and left to straight drainage. S-p tube placed to traction. Bleeding seems to come from s-p insertion site.  Pt awakened and taken  To recovery room in stable condition.

## 2011-07-02 NOTE — Op Note (Signed)
Pre-operative diagnosis : MS and urinary incontinence  Postoperative diagnosis:Same  Operation: Cytoscopy and suprapubic tube placement  Surgeon:  S. Patsi Sears, MD  Anesthesia:  general  Preparation: After appropriate preanesthesia, the patient was brought to the operating room and placed on the operating table in the dorsal supine position where general LMA anesthesia was introduced. She was then replaced in the dorsal lithotomy position, where the abdomen and pubis were prepped with Betadine solution and draped in usual fashion. Timeout was observed.  Review history: The patient is a 60 year old patient bedridden for multiple scars is, with urinary incontinence, now for suprapubic catheterization.  Statement of  Likelihood of Success: Excellent. TIME-OUT observed.:  Procedure: Cystourethroscopy was accomplished and shows a small capacity bladder. The patient is in lithotomy position, an air bubble is noted at the top of the bladder. The 7 cm spinal needle was used to find the bladder route, and a cutdown is accomplished right along the needle. It was removed. A 1 cm cutdown was accomplished, and the Lowsley tractor was placed the bladder route. There was accomplished allowing the Lowsley to be brought into the suprapubic wound. It is opened, and a 20 [place] Silastic Foley catheter was placed with 10 cc in the balloon. The suprapubic wound is sutured closed. There is bleeding noted from the superior excite requiring pressure and sutures. The patient was awakened and recurrent in good condition.

## 2011-07-02 NOTE — Progress Notes (Signed)
  Subjective: Patient reports no pain. Still some blood in urine.  Objective: Vital signs in last 24 hours: Temp:  [97.4 F (36.3 C)-98 F (36.7 C)] 97.6 F (36.4 C) (12/31 1500) Pulse Rate:  [56-77] 68  (12/31 1500) Resp:  [9-20] 12  (12/31 1500) BP: (102-140)/(46-85) 133/85 mmHg (12/31 1500) SpO2:  [97 %-100 %] 99 % (12/31 1500) Weight:  [94.802 kg (209 lb)] 209 lb (94.802 kg) (12/31 1500)A  Intake/Output from previous day:   Intake/Output this shift: Total I/O In: 1300 [I.V.:1300] Out: 850 [Urine:750; Blood:100]  Past Medical History  Diagnosis Date  . Urinary incontinence     neurogenic bladder--hx of uti's  . Urinary catheter in place     #18, 10 cc balloon  . Thyroid disease   . Multiple sclerosis   . GERD (gastroesophageal reflux disease)   . Asthma     as a young child-no problems since  . Hypothyroidism   . Anxiety   . Blindness of right eye     complication of ms  . Arthritis     old falls-with injuries to rt knee and both ankles and rt wrist--states she has some pain in these areas-probable arthritis  . Cellulitis     hx of cellulitis in lower extremities -requiring multiple hospitalzations in the past--no problems in las 2 yrs  . Neuromuscular disorder     ms-not ambulatory-muscle spasms-and recent tremors left arm and left  leg have developed--dr. Daphane Shepherd in [place] is pt's neurologist    Physical Exam:  General awake and alert, eating supper.  Lungs - Normal respiratory effort, chest expands symmetrically.  Abdomen - Soft, non-tender & non-distended. Catheters in good position. Urine heme stained.  Lab Results:  Basename 07/02/11 1322 07/02/11 0904  WBC -- 9.0  HGB 12.0 13.2  HCT 36.3 40.2   BMET  Basename 07/02/11 0904  NA 140  K 4.4  CL 101  CO2 31  GLUCOSE 100*  BUN 15  CREATININE 0.63  CALCIUM 9.9   No results found for this basename: LABURIN:1 in the last 72 hours Results for orders placed during the hospital encounter of  07/02/11  SURGICAL PCR SCREEN     Status: Normal   Collection Time   07/02/11  8:45 AM      Component Value Range Status Comment   MRSA, PCR NEGATIVE  NEGATIVE  Final    Staphylococcus aureus NEGATIVE  NEGATIVE  Final     Studies/Results: @RISRSLT24 @  Assessment/Plan:  Stable post op.  P: Remove foley from urethra and d/c in AM. Continue with traction on s-p tube.   Jethro Bolus I 07/02/2011, 5:28 PM

## 2011-07-02 NOTE — Anesthesia Preprocedure Evaluation (Addendum)
Anesthesia Evaluation  Patient identified by MRN, date of birth, ID band Patient awake    Reviewed: Allergy & Precautions, H&P , NPO status , Patient's Chart, lab work & pertinent test results  Airway Mallampati: II TM Distance: >3 FB Neck ROM: Full    Dental No notable dental hx.    Pulmonary neg pulmonary ROS,  clear to auscultation  Pulmonary exam normal       Cardiovascular neg cardio ROS Regular Normal    Neuro/Psych PSYCHIATRIC DISORDERS Anxiety  Neuromuscular disease (Multiple sclerosis. bedridden) Negative Neurological ROS  Negative Psych ROS   GI/Hepatic negative GI ROS, Neg liver ROS, GERD-  ,  Endo/Other  Negative Endocrine ROSHypothyroidism   Renal/GU negative Renal ROS  Genitourinary negative   Musculoskeletal negative musculoskeletal ROS (+)   Abdominal   Peds negative pediatric ROS (+)  Hematology negative hematology ROS (+)   Anesthesia Other Findings   Reproductive/Obstetrics negative OB ROS                          Anesthesia Physical Anesthesia Plan  ASA: II  Anesthesia Plan: General   Post-op Pain Management:    Induction: Intravenous  Airway Management Planned: LMA  Additional Equipment:   Intra-op Plan:   Post-operative Plan: Extubation in OR  Informed Consent: I have reviewed the patients History and Physical, chart, labs and discussed the procedure including the risks, benefits and alternatives for the proposed anesthesia with the patient or authorized representative who has indicated his/her understanding and acceptance.   Dental advisory given  Plan Discussed with: CRNA  Anesthesia Plan Comments:        Anesthesia Quick Evaluation

## 2011-07-02 NOTE — Transfer of Care (Signed)
Immediate Anesthesia Transfer of Care Note  Patient: [name]  Procedure(s) Performed:  CYSTOSCOPY; INSERTION OF SUPRAPUBIC CATHETER - Insertion of Suprapubic Tube  Patient Location: PACU  Anesthesia Type: General  Level of Consciousness: sedated, patient cooperative and responds to stimulaton  Airway & Oxygen Therapy: Patient Spontanous Breathing and Patient connected to face mask oxgen  Post-op Assessment: Report given to PACU RN and Post -op Vital signs reviewed and stable  Post vital signs: Reviewed and stable  Complications: No apparent anesthesia complications

## 2011-07-03 LAB — HEMOGLOBIN AND HEMATOCRIT, BLOOD: Hemoglobin: 9.9 g/dL — ABNORMAL LOW (ref 12.0–15.0)

## 2011-07-03 NOTE — Discharge Summary (Signed)
Physician Discharge Summary  Patient ID: [name] MRN: [id number] DOB/AGE: Feb 21, 1951 61 y.o.  Admit date: 07/02/2011 Discharge date: 07/03/2011  Admission Diagnoses:Neurogenic bladder with chronic cystitis.  Discharge Diagnoses: Same with clot retention. Active Problems:  * No active hospital problems. *    Discharged Condition: good  Hospital Course: Mr. Oddo came in for a suprapubic tube insertion.  After the tube insertion she had bleeding with clot retention that required another trip to the OR for clot evacuation and fulguration of bleeders.   Her urine was clear this morning and the foley was  Removed.   She is without complaints and will be sent home today.  Consults: none  Significant Diagnostic Studies: none  Treatments: surgery: Suprapubic tube insertion and cysto with clot evacuation.  Discharge Exam: Blood pressure 102/60, pulse 100, temperature 98.7 F (37.1 C), temperature source Oral, resp. rate 12, height 5\' 2"  (1.575 m), weight 94.802 kg (209 lb), SpO2 93.00%. General appearance: alert and no distress GI: Suprapubic tube dressing dry without bleeding.  Disposition: Home or Self Care  Discharge Orders    Future Orders Please Complete By Expires   Diet - low sodium heart healthy      Increase activity slowly      Change dressing (specify)      Comments:   Dressing change: as needed for bleeding.     Medication List  As of 07/03/2011  9:37 AM   START taking these medications         hydrocodone-acetaminophen 7.5-650 MG per tablet   Commonly known as: LORCET PLUS   Take 1 tablet by mouth every 6 (six) hours as needed for pain.      trimethoprim 100 MG tablet   Commonly known as: TRIMPEX   Take 1 tablet (100 mg total) by mouth 1 day or 1 dose.         CONTINUE taking these medications         ALPRAZolam 0.5 MG tablet   Commonly known as: XANAX      atorvastatin 20 MG tablet   Commonly known as: LIPITOR      baclofen 20 MG tablet   Commonly known as: LIORESAL      calcium carbonate 500 MG chewable tablet   Commonly known as: TUMS - dosed in mg elemental calcium      calcium carbonate 600 MG Tabs   Commonly known as: OS-CAL      darifenacin 7.5 MG 24 hr tablet   Commonly known as: ENABLEX      DUODERM HYDROACTIVE EX      esomeprazole 40 MG capsule   Commonly known as: NEXIUM      furosemide 20 MG tablet   Commonly known as: LASIX      gabapentin 400 MG capsule   Commonly known as: NEURONTIN      ibuprofen 800 MG tablet   Commonly known as: ADVIL,MOTRIN      levothyroxine 88 MCG tablet   Commonly known as: SYNTHROID, LEVOTHROID      multivitamin with minerals tablet      polyethylene glycol packet   Commonly known as: MIRALAX / GLYCOLAX      PRESCRIPTION MEDICATION      sennosides-docusate sodium 8.6-50 MG tablet   Commonly known as: SENOKOT-S      sertraline 25 MG tablet   Commonly known as: ZOLOFT      tetrahydrozoline 0.05 % ophthalmic solution      vitamin C 500 MG tablet  Commonly known as: ASCORBIC ACID      vitamin D (CHOLECALCIFEROL) 400 UNITS tablet         STOP taking these medications         acetaminophen 500 MG tablet      aspirin 81 MG tablet      fish oil-omega-3 fatty acids 1000 MG capsule          Where to get your medications    These are the prescriptions that you need to pick up.   You may get these medications from any pharmacy.         hydrocodone-acetaminophen 7.5-650 MG per tablet   trimethoprim 100 MG tablet           Follow-up Information    Follow up with TANNENBAUM, SIGMUND I, MD in 4 weeks.   Contact information:   [address] [facility] [place] [address] [phone]          Signed: Anner Crete 07/03/2011, 9:37 AM

## 2011-07-04 ENCOUNTER — Encounter (HOSPITAL_COMMUNITY): Payer: Self-pay | Admitting: Urology

## 2011-09-15 ENCOUNTER — Encounter (HOSPITAL_BASED_OUTPATIENT_CLINIC_OR_DEPARTMENT_OTHER): Payer: Self-pay | Admitting: *Deleted

## 2011-09-15 ENCOUNTER — Emergency Department (HOSPITAL_BASED_OUTPATIENT_CLINIC_OR_DEPARTMENT_OTHER)
Admission: EM | Admit: 2011-09-15 | Discharge: 2011-09-15 | Disposition: A | Discharge status: Home / Self Care | Payer: Medicare Other | Attending: Emergency Medicine | Admitting: Emergency Medicine

## 2011-09-15 DIAGNOSIS — N390 Urinary tract infection, site not specified: Secondary | ICD-10-CM

## 2011-09-15 DIAGNOSIS — G35D Multiple sclerosis, unspecified: Secondary | ICD-10-CM

## 2011-09-15 DIAGNOSIS — M129 Arthropathy, unspecified: Secondary | ICD-10-CM | POA: Insufficient documentation

## 2011-09-15 DIAGNOSIS — E039 Hypothyroidism, unspecified: Secondary | ICD-10-CM | POA: Insufficient documentation

## 2011-09-15 DIAGNOSIS — H5440 Blindness, one eye, unspecified eye: Secondary | ICD-10-CM | POA: Insufficient documentation

## 2011-09-15 DIAGNOSIS — K219 Gastro-esophageal reflux disease without esophagitis: Secondary | ICD-10-CM | POA: Insufficient documentation

## 2011-09-15 DIAGNOSIS — T8389XA Other specified complication of genitourinary prosthetic devices, implants and grafts, initial encounter: Secondary | ICD-10-CM | POA: Insufficient documentation

## 2011-09-15 DIAGNOSIS — T83010A Breakdown (mechanical) of cystostomy catheter, initial encounter: Secondary | ICD-10-CM

## 2011-09-15 DIAGNOSIS — G35 Multiple sclerosis: Secondary | ICD-10-CM | POA: Insufficient documentation

## 2011-09-15 DIAGNOSIS — Y846 Urinary catheterization as the cause of abnormal reaction of the patient, or of later complication, without mention of misadventure at the time of the procedure: Secondary | ICD-10-CM | POA: Insufficient documentation

## 2011-09-15 DIAGNOSIS — R142 Eructation: Secondary | ICD-10-CM | POA: Insufficient documentation

## 2011-09-15 DIAGNOSIS — Z79899 Other long term (current) drug therapy: Secondary | ICD-10-CM | POA: Insufficient documentation

## 2011-09-15 DIAGNOSIS — J45909 Unspecified asthma, uncomplicated: Secondary | ICD-10-CM | POA: Insufficient documentation

## 2011-09-15 DIAGNOSIS — R141 Gas pain: Secondary | ICD-10-CM | POA: Insufficient documentation

## 2011-09-15 DIAGNOSIS — R197 Diarrhea, unspecified: Secondary | ICD-10-CM | POA: Insufficient documentation

## 2011-09-15 LAB — URINALYSIS, ROUTINE W REFLEX MICROSCOPIC
Bilirubin Urine: NEGATIVE
Glucose, UA: NEGATIVE mg/dL
Ketones, ur: NEGATIVE mg/dL
pH: 7 (ref 5.0–8.0)

## 2011-09-15 LAB — URINE MICROSCOPIC-ADD ON

## 2011-09-15 MED ORDER — NITROFURANTOIN MONOHYD MACRO 100 MG PO CAPS: 100.0000 mg | ORAL_CAPSULE | Freq: Two times a day (BID) | ORAL | Status: DC

## 2011-09-15 MED ORDER — NITROFURANTOIN MONOHYD MACRO 100 MG PO CAPS: 100.0000 mg | ORAL_CAPSULE | Freq: Two times a day (BID) | ORAL | Status: AC

## 2011-09-15 MED ORDER — NITROFURANTOIN MONOHYD MACRO 100 MG PO CAPS
100.0000 mg | ORAL_CAPSULE | Freq: Once | ORAL | Status: AC
  Administered 2011-09-15: 100 mg via ORAL
  Filled 2011-09-15: qty 1

## 2011-09-15 NOTE — Discharge Instructions (Signed)
Urinary Tract Infection Infections of the urinary tract can start in several places. A bladder infection (cystitis), a kidney infection (pyelonephritis), and a prostate infection (prostatitis) are different types of urinary tract infections (UTIs). They usually get better if treated with medicines (antibiotics) that kill germs. Take all the medicine until it is gone. You or your child may feel better in a few days, but TAKE ALL MEDICINE or the infection may not respond and may become more difficult to treat. HOME CARE INSTRUCTIONS   Drink enough water and fluids to keep the urine clear or pale yellow. Cranberry juice is especially recommended, in addition to large amounts of water.   Avoid caffeine, tea, and carbonated beverages. They tend to irritate the bladder.   Alcohol may irritate the prostate.   Only take over-the-counter or prescription medicines for pain, discomfort, or fever as directed by your caregiver.  To prevent further infections:  Empty the bladder often. Avoid holding urine for long periods of time.   After a bowel movement, women should cleanse from front to back. Use each tissue only once.   Empty the bladder before and after sexual intercourse.  FINDING OUT THE RESULTS OF YOUR TEST Not all test results are available during your visit. If your or your child's test results are not back during the visit, make an appointment with your caregiver to find out the results. Do not assume everything is normal if you have not heard from your caregiver or the medical facility. It is important for you to follow up on all test results. SEEK MEDICAL CARE IF:   There is back pain.   Your baby is older than 3 months with a rectal temperature of 100.5 F (38.1 C) or higher for more than 1 day.   Your or your child's problems (symptoms) are no better in 3 days. Return sooner if you or your child is getting worse.  SEEK IMMEDIATE MEDICAL CARE IF:   There is severe back pain or lower  abdominal pain.   You or your child develops chills.   You have a fever.   Your baby is older than 3 months with a rectal temperature of 102 F (38.9 C) or higher.   Your baby is 73 months old or younger with a rectal temperature of 100.4 F (38 C) or higher.   There is nausea or vomiting.   There is continued burning or discomfort with urination.  MAKE SURE YOU:   Understand these instructions.   Will watch your condition.   Will get help right away if you are not doing well or get worse.  Document Released: 03/28/2005 Document Revised: 06/07/2011 Document Reviewed: 10/31/2006 [facility] Patient Information 2012 [place], [place].       Foley Catheter Care, Adult A soft, flexible tube (Foley catheter) has been placed in your bladder. This may be done to temporarily help with urine drainage after an operation or to relieve blockage from an enlarged prostate gland. HOME CARE INSTRUCTIONS  If you are going home with a Foley catheter in place, follow these instructions: Taking Care of the Catheter:  Keep the area where the catheter leaves your body clean.   Attach the catheter to the leg so there is no tension on the catheter.   Keep the drainage bag below the level of the bladder, but keep it OFF the floor.   Do not take long soaking baths. Your caregiver will give instructions about showering.   Wash your hands before touching ANYTHING related  to the catheter or bag.   Using mild soap and warm water on a washcloth:   Clean the area closest to the catheter insertion site using a circular motion around the catheter.   Clean the catheter itself by wiping AWAY from the insertion site for several inches down the tube.   NEVER wipe upward as this could sweep bacteria up into the urethra (tube in your body that normally drains the bladder) and cause infection.  Taking Care of the Drainage Bags:  Two drainage bags will be taken home: a large overnight drainage bag, and a  smaller leg bag which fits underneath clothing.   It is okay to wear the overnight bag at any time, but NEVER wear the smaller leg bag at night.   Keep the drainage bag well below the level of your bladder. This prevents backflow of urine into the bladder and allows the urine to drain freely.   Anchor the tubing to your leg to prevent pulling or tension on the catheter. Use tape or a leg strap provided by the hospital.   Empty the drainage bag when it is  to  full. Wash your hands before and after touching the bag.   Periodically check the tubing for kinks to make sure there is no pressure on the tubing which could restrict the flow of urine.  Changing the Drainage Bags:  Cleanse both ends of the clean bag with alcohol before changing.   Pinch off the rubber catheter to avoid urine spillage during the disconnection.   Disconnect the dirty bag and connect the clean one.   Empty the dirty bag carefully to avoid a urine spill.   Attach the new bag to the leg with tape or a leg strap.  Cleaning the Drainage Bags:  Whenever a drainage bag is disconnected, it must be cleaned quickly so it is ready for the next use.   Wash the bag in warm, soapy water.   Rinse the bag thoroughly with warm water.   Soak the bag for 30 minutes in a solution of white vinegar and water (1 cup vinegar to 1 quart warm water).   Rinse with warm water.  SEEK MEDICAL CARE IF:   Some pain develops in the kidney (lower back) area.   The urine is cloudy or smells bad.   There is some blood in the urine.   The catheter becomes clogged and/or there is no urine drainage.  SEEK IMMEDIATE MEDICAL CARE IF:   You have moderate or severe pain in the kidney region.   You start to throw up (vomit).   Blood fills the tube.   Worsening belly (abdominal) pain develops.   You have a fever.  MAKE SURE YOU:   Understand these instructions.   Will watch your condition.   Will get help right away if you are  not doing well or get worse.  Document Released: 06/18/2005 Document Revised: 06/07/2011 Document Reviewed: 12/13/2006 [facility] Patient Information 2012 [place], [place].    Multiple Sclerosis Multiple sclerosis (MS) is a disease of the central nervous system. Its cause is unknown. It is more common in the [place] states than in the [place] states. There is a higher incidence of MS in women. There is a wide variation in the symptoms (problems) of MS. This is because of the many different ways it affects the central nervous system. It often comes on in episodes or attacks. These attacks may last weeks to months. There may be long periods  of nearly no problems between attacks. The main symptoms include visual problems (associated with eye pain), numbness, weakness, and paralysis in extremities (arms/hands and legs/feet). There may also be tremors and problems with balance and walking. The age when MS starts is variable. Advances in medicine continue to improve the treatment of this illness. There is no known cure for MS but there are medications that help. MS is not an inherited illness, although your risk of getting this disease is higher if you have a relative with MS. The best radiologic (x-ray) study for MS is an MRI (magnetic resonance imaging). There are medications available to decrease the number and frequency of attacks. SYMPTOMS  The symptoms of MS are caused by loss of insulation (myelin) of the nerves of the brain. When this happens, brain signals do not get transmitted properly or may not get transmitted at all. Some of the problems caused by this include:   Numbness.   Weakness.   Paralysis in extremities.   Visual problems, eye pain.   Balance problems.   Tremors.  DIAGNOSIS  Your caregiver can do studies on you to make this diagnosis. This may include specialized X-rays and spinal fluid studies. HOME CARE INSTRUCTIONS   Take medications as directed by your caregiver.  Baclofen is a drug commonly used to reduce muscle spasticity. Steroids are often used for short term relief.   Exercise as directed.   Use physical and occupational therapy as directed by your caregiver. Careful attention to this medical care can help avoid depression.   See your caregiver if you begin to have problems with depression. This is a common problem in MS. Patients often continue to work many years after the diagnosis of MS.  Document Released: 06/15/2000 Document Revised: 06/07/2011 Document Reviewed: 01/22/2007 [facility] Patient Information 2012 [place], [place].

## 2011-09-15 NOTE — ED Notes (Signed)
[facility] here to transport pt home. Caregiver with pt

## 2011-09-15 NOTE — ED Provider Notes (Signed)
History    This chart was scribed for Sheba Whaling A. Patrica Duel, MD, MD by Smitty Pluck. The patient was seen in room MH05 and the patient's care was started at 10:01PM.   CSN: [id number]  Arrival date & time 09/15/11  2009   First MD Initiated Contact with Patient 09/15/11 2157      Chief Complaint  Patient presents with  . cather not draining     (Consider location/radiation/quality/duration/timing/severity/associated sxs/prior treatment) The history is provided by the patient.   ALLURE GREASER is a 61 y.o. female who presents to the Emergency Department complaining of cathter not draining onset today. Pt has hx of severe MS and is bed bound. Pt reports that it has since been draining due to EMT moving her. She denies back pain, fever and vomiting. Caregiver states that she has not had a UTI in awhile. She reports having diarrhea for the past couple of days.    Past Medical History  Diagnosis Date  . Urinary incontinence     neurogenic bladder--hx of uti's  . Urinary catheter in place     #18, 10 cc balloon  . Thyroid disease   . Multiple sclerosis   . GERD (gastroesophageal reflux disease)   . Asthma     as a young child-no problems since  . Hypothyroidism   . Anxiety   . Blindness of right eye     complication of ms  . Arthritis     old falls-with injuries to rt knee and both ankles and rt wrist--states she has some pain in these areas-probable arthritis  . Cellulitis     hx of cellulitis in lower extremities -requiring multiple hospitalzations in the past--no problems in las 2 yrs  . Neuromuscular disorder     ms-not ambulatory-muscle spasms-and recent tremors left arm and left  leg have developed--dr. Daphane Shepherd in [place] is pt's neurologist    Past Surgical History  Procedure Date  . Cholecystectomy   . Tonsillectomy   . Ankle surgery   . Wrist surgery   . Lymph gland excision   . Mediastinoscopy 04/24/10    for enlarged right paratracheal lymph node--surgery at  [facility]-Dr. Burney--pt states she does not know the  what the pathology report showed.  . Adenoidectomy   . Fracture surgery 2005    orif left ankle fx  . Cystoscopy 07/02/2011    Procedure: CYSTOSCOPY;  Surgeon: Kathi Ludwig, MD;  Location: [facility] ORS;  Service: Urology;  Laterality: N/A;    History reviewed. No pertinent family history.  History  Substance Use Topics  . Smoking status: Never Smoker   . Smokeless tobacco: Never Used  . Alcohol Use: No    OB History    Grav Para Term Preterm Abortions TAB SAB Ect Mult Living                  Review of Systems  All other systems reviewed and are negative.   10 Systems reviewed and are negative for acute change except as noted in the HPI.  Allergies  Avelox; Ciprofloxacin hcl; Penicillins; and Shrimp  Home Medications   Current Outpatient Rx  Name Route Sig Dispense Refill  . ALPRAZOLAM 0.5 MG PO TABS Oral Take 0.5 mg by mouth 3 (three) times daily as needed. For anxiety    . ATORVASTATIN CALCIUM 20 MG PO TABS Oral Take 20 mg by mouth at bedtime.     Marland Kitchen BACLOFEN 20 MG PO TABS Oral Take 20 mg by  mouth 4 (four) times daily.     Marland Kitchen CALCIUM CARBONATE 600 MG PO TABS Oral Take 600 mg by mouth 2 (two) times daily.     Marland Kitchen CALCIUM CARBONATE ANTACID 500 MG PO CHEW Oral Chew 1 tablet by mouth once as needed. For heart burn    . ESOMEPRAZOLE MAGNESIUM 40 MG PO CPDR Oral Take 40 mg by mouth daily.     . FUROSEMIDE 20 MG PO TABS Oral Take 20 mg by mouth daily at 12 noon.     Marland Kitchen GABAPENTIN 400 MG PO CAPS Oral Take 800 mg by mouth 4 (four) times daily.     Marland Kitchen LEVOTHYROXINE SODIUM 88 MCG PO TABS Oral Take 88 mcg by mouth every morning.     . MULTI-VITAMIN/MINERALS PO TABS Oral Take 1 tablet by mouth daily.     Marland Kitchen POLYETHYLENE GLYCOL 3350 PO PACK Oral Take 17 g by mouth daily.     . SENNA-DOCUSATE SODIUM 8.6-50 MG PO TABS Oral Take 1 tablet by mouth daily.     . SERTRALINE HCL 25 MG PO TABS Oral Take 25 mg by mouth every morning.     .  TETRAHYDROZOLINE HCL 0.05 % OP SOLN Both Eyes Place 1 drop into both eyes 2 (two) times daily as needed. DRY EYES     . VITAMIN D 400 UNITS PO TABS Oral Take 400 Units by mouth 2 (two) times daily.     Marland Kitchen DARIFENACIN HYDROBROMIDE ER 7.5 MG PO TB24 Oral Take 7.5 mg by mouth 2 (two) times daily.     . DUODERM HYDROACTIVE EX Apply externally Apply 1 patch topically as needed. For sores    . PRESCRIPTION MEDICATION Topical Apply 1 application topically daily. Barrier cream      BP 143/61  Pulse 90  Temp(Src) 98.2 F (36.8 C) (Oral)  Resp 19  SpO2 97%  Physical Exam  Nursing note and vitals reviewed. Constitutional: She is oriented to person, place, and time. She appears well-nourished. No distress.       Calm, comfortable, cooperative   HENT:  Head: Normocephalic and atraumatic.  Eyes: Conjunctivae are normal. Pupils are equal, round, and reactive to light.  Neck: Normal range of motion. Neck supple.  Cardiovascular: Normal rate, regular rhythm and normal heart sounds.   Pulmonary/Chest: Effort normal. No respiratory distress.  Abdominal: She exhibits distension (mild). There is no tenderness. There is no rebound and no guarding.       Suprapubic cathter in place No surrounding erythema No discharge no odor   Neurological: She is alert and oriented to person, place, and time.       Chronic debilitated, severe MS, bed bound  Skin: Skin is warm and dry.  Psychiatric: She has a normal mood and affect. Her behavior is normal.    ED Course  Procedures (including critical care time) DIAGNOSTIC STUDIES: Oxygen Saturation is 97% on room air, normal by my interpretation.    COORDINATION OF CARE: 10:08PM EDP discusses pt ED treatment course with pt   Labs Reviewed  URINALYSIS, ROUTINE W REFLEX MICROSCOPIC - Abnormal; Notable for the following:    Hgb urine dipstick LARGE (*)    Nitrite POSITIVE (*)    Leukocytes, UA MODERATE (*)    All other components within normal limits  URINE  MICROSCOPIC-ADD ON - Abnormal; Notable for the following:    Bacteria, UA MANY (*)    All other components within normal limits   No results found.   No diagnosis  found.    MDM  Pt is seen and examined;  Initial history and physical completed.  Will follow.   I personally performed the services described in this documentation, which was scribed in my presence.  The recorded information has been reviewed and considered. Theodore Rahrig A. Patrica Duel, MD.  @ATTPRO @    Patient was seen for partially occluded suprapubic catheter. However, the problem had resolved. Some time in the interim during transport. In addition, her urine showed large hemoglobin, nitrites, leukocytes, and bacteria, likely consistent with an infection.  This and otherwise, appears quite well. Does not appear to be septic. Nontoxic. Vital signs are within normal limits. Afebrile  Plan: Will send off urine culture and send patient home on a course of Macrobid. She is allergic to fluoroquinolones. Will have her followup with her primary care physician early this week. Instructions to return for any concerns or changing symptoms       Tia Hieronymus A. Patrica Duel, MD 09/15/11 2239

## 2011-09-15 NOTE — ED Notes (Signed)
Per EMS pt suprapubic cath not draining

## 2011-09-17 LAB — URINE CULTURE: Culture  Setup Time: 201303170237

## 2011-09-18 NOTE — ED Notes (Signed)
+   Urine Patient treated with Macrobid-no sensitivities listed.

## 2011-09-20 NOTE — ED Notes (Signed)
Attempted to call patient.  No answer.

## 2011-09-20 NOTE — ED Notes (Signed)
Chart returned from EDP. Patient has pseudomonas UTI. Macrobid will not work. Cipro is oral treatment of choice and patient is allergic. Patient should be aware and patient's PCP should be made aware. If she cannot take oral Cipro, IV abx may need to be administered. Reviewed by Rhea Bleacher PA-C.

## 2011-09-22 NOTE — ED Notes (Signed)
Called patient. Informed patient of need to change medication. Patient will follow up with PCP. If it doesn't work, patient will come back for IV abx.

## 2012-02-06 ENCOUNTER — Emergency Department (HOSPITAL_BASED_OUTPATIENT_CLINIC_OR_DEPARTMENT_OTHER): Payer: Medicare Other

## 2012-02-06 ENCOUNTER — Emergency Department (HOSPITAL_BASED_OUTPATIENT_CLINIC_OR_DEPARTMENT_OTHER)
Admission: EM | Admit: 2012-02-06 | Discharge: 2012-02-06 | Disposition: A | Discharge status: Home / Self Care | Payer: Medicare Other | Attending: Emergency Medicine | Admitting: Emergency Medicine

## 2012-02-06 ENCOUNTER — Encounter (HOSPITAL_BASED_OUTPATIENT_CLINIC_OR_DEPARTMENT_OTHER): Payer: Self-pay

## 2012-02-06 DIAGNOSIS — F411 Generalized anxiety disorder: Secondary | ICD-10-CM | POA: Insufficient documentation

## 2012-02-06 DIAGNOSIS — K219 Gastro-esophageal reflux disease without esophagitis: Secondary | ICD-10-CM | POA: Insufficient documentation

## 2012-02-06 DIAGNOSIS — E039 Hypothyroidism, unspecified: Secondary | ICD-10-CM | POA: Insufficient documentation

## 2012-02-06 DIAGNOSIS — Z880 Allergy status to penicillin: Secondary | ICD-10-CM | POA: Insufficient documentation

## 2012-02-06 DIAGNOSIS — M25572 Pain in left ankle and joints of left foot: Secondary | ICD-10-CM

## 2012-02-06 DIAGNOSIS — G35 Multiple sclerosis: Secondary | ICD-10-CM | POA: Insufficient documentation

## 2012-02-06 DIAGNOSIS — M25579 Pain in unspecified ankle and joints of unspecified foot: Secondary | ICD-10-CM | POA: Insufficient documentation

## 2012-02-06 DIAGNOSIS — N319 Neuromuscular dysfunction of bladder, unspecified: Secondary | ICD-10-CM | POA: Insufficient documentation

## 2012-02-06 DIAGNOSIS — Z993 Dependence on wheelchair: Secondary | ICD-10-CM | POA: Insufficient documentation

## 2012-02-06 MED ORDER — TRAMADOL HCL 50 MG PO TABS: 50.0000 mg | ORAL_TABLET | Freq: Four times a day (QID) | ORAL | Status: AC | PRN

## 2012-02-06 NOTE — ED Provider Notes (Signed)
Medical screening examination/treatment/procedure(s) were performed by non-physician practitioner and as supervising physician I was immediately available for consultation/collaboration.   Carleene Cooper III, MD 02/06/12 2215

## 2012-02-06 NOTE — ED Notes (Signed)
Pt reports pain in right ankle x 2 weeks.  Had an in home XR (CD with chart) and PMD sent pt to ED for further evaluation.

## 2012-02-06 NOTE — ED Provider Notes (Signed)
History     CSN: [id number]  Arrival date & time 02/06/12  1702   First MD Initiated Contact with Patient 02/06/12 1835      Chief Complaint  Patient presents with  . Ankle Pain    (Consider location/radiation/quality/duration/timing/severity/associated sxs/prior treatment) HPI  61 y.o. wheelchair-bound female with and asked complaining of pain to right ankle for 2 weeks. Pain is 6/10 and relieved with ibuprofen however the patient is concerned that the pain is still concerning at 2 weeks out. Patient had mild trauma with bumping the lateral area of the ankle at onset 14 days ago. Patient denies any swelling,  numbness or paresthesia. Patient states that her primary care doctor is concerned that the x-ray done in her home is of poor quality and he is concerned about osteomyelitis.   Past Medical History  Diagnosis Date  . Urinary incontinence     neurogenic bladder--hx of uti's  . Urinary catheter in place     #18, 10 cc balloon  . Thyroid disease   . Multiple sclerosis   . GERD (gastroesophageal reflux disease)   . Asthma     as a young child-no problems since  . Hypothyroidism   . Anxiety   . Blindness of right eye     complication of ms  . Arthritis     old falls-with injuries to rt knee and both ankles and rt wrist--states she has some pain in these areas-probable arthritis  . Cellulitis     hx of cellulitis in lower extremities -requiring multiple hospitalzations in the past--no problems in las 2 yrs  . Neuromuscular disorder     ms-not ambulatory-muscle spasms-and recent tremors left arm and left  leg have developed--dr. Daphane Shepherd in [place] is pt's neurologist    Past Surgical History  Procedure Date  . Cholecystectomy   . Tonsillectomy   . Ankle surgery   . Wrist surgery   . Lymph gland excision   . Mediastinoscopy 04/24/10    for enlarged right paratracheal lymph node--surgery at [facility]-Dr. Burney--pt states she does not know the  what the pathology report  showed.  . Adenoidectomy   . Fracture surgery 2005    orif left ankle fx  . Cystoscopy October 09, 202012    Procedure: CYSTOSCOPY;  Surgeon: Kathi Ludwig, MD;  Location: [facility];  Service: Urology;  Laterality: N/A;    No family history on file.  History  Substance Use Topics  . Smoking status: Never Smoker   . Smokeless tobacco: Never Used  . Alcohol Use: No    OB History    Grav Para Term Preterm Abortions TAB SAB Ect Mult Living                  Review of Systems  Musculoskeletal: Positive for arthralgias. Negative for joint swelling.  Neurological: Negative for numbness.  All other systems reviewed and are negative.    Allergies  Avelox; Ciprofloxacin hcl; Penicillins; and Shrimp  Home Medications   Current Outpatient Rx  Name Route Sig Dispense Refill  . ALPRAZOLAM 0.5 MG PO TABS Oral Take 0.5 mg by mouth 3 (three) times daily as needed. For anxiety    . ATORVASTATIN CALCIUM 20 MG PO TABS Oral Take 20 mg by mouth at bedtime.     Marland Kitchen BACLOFEN 20 MG PO TABS Oral Take 20 mg by mouth 4 (four) times daily.     Marland Kitchen CALCIUM CARBONATE 600 MG PO TABS Oral Take 600 mg by mouth 2 (two)  times daily.     Marland Kitchen CRANBERRY 12600 MG PO CAPS Oral Take 1 capsule by mouth daily.    Marland Kitchen DARIFENACIN HYDROBROMIDE ER 7.5 MG PO TB24 Oral Take 7.5 mg by mouth 2 (two) times daily.     Marland Kitchen ESOMEPRAZOLE MAGNESIUM 40 MG PO CPDR Oral Take 40 mg by mouth daily.     . FUROSEMIDE 20 MG PO TABS Oral Take 20 mg by mouth daily at 12 noon.     Marland Kitchen GABAPENTIN 400 MG PO CAPS Oral Take 800 mg by mouth 4 (four) times daily.     Marland Kitchen LEVOTHYROXINE SODIUM 88 MCG PO TABS Oral Take 88 mcg by mouth every morning.     . MULTI-VITAMIN/MINERALS PO TABS Oral Take 1 tablet by mouth daily.     Marland Kitchen PRESCRIPTION MEDICATION Topical Apply 1 application topically daily. Barrier cream    . SENNA-DOCUSATE SODIUM 8.6-50 MG PO TABS Oral Take 1 tablet by mouth daily.     . SERTRALINE HCL 25 MG PO TABS Oral Take 25 mg by mouth every morning.      . TETRAHYDROZOLINE HCL 0.05 % OP SOLN Both Eyes Place 1 drop into both eyes 2 (two) times daily as needed. DRY EYES    . VITAMIN D 400 UNITS PO TABS Oral Take 400 Units by mouth 2 (two) times daily.     . DUODERM HYDROACTIVE EX Apply externally Apply 1 patch topically as needed. For sores      BP 178/63  Pulse 91  Temp 98.2 F (36.8 C) (Oral)  Resp 16  Ht 5\' 2"  (1.575 m)  Wt 234 lb (106.142 kg)  BMI 42.80 kg/m2  SpO2 93%  Physical Exam  Vitals reviewed. Constitutional: She is oriented to person, place, and time. She appears well-developed and well-nourished. No distress.       Obese and wheelchair-bound  HENT:  Head: Normocephalic.  Eyes: Conjunctivae and EOM are normal.  Cardiovascular: Normal rate.   Pulmonary/Chest: Effort normal.  Musculoskeletal: Normal range of motion. She exhibits no edema and no tenderness.       No cell swelling erythema or other signs of trauma. No tenderness to palpation. Patient states that pain is in the lateral malleolus not exacerbated by palpation  Neurological: She is alert and oriented to person, place, and time.  Psychiatric: She has a normal mood and affect.    ED Course  Procedures (including critical care time)  Labs Reviewed - No data to display Dg Ankle Complete Right  02/06/2012  *RADIOLOGY REPORT*  Clinical Data: Ankle pain post injury 2 weeks ago  RIGHT ANKLE - COMPLETE 3+ VIEW  Comparison: None.  Findings: Four views of the right ankle submitted.  No acute fracture or subluxation.  There is diffuse osteopenia.  Plantar spur of the calcaneus is noted.  IMPRESSION: No acute fracture or subluxation.  Diffuse osteopenia.  Plantar spur of the calcaneus.  Original Report Authenticated By: Natasha Mead, M.D.     1. Left ankle pain       MDM  61 year old female with persistent right ankle pain status post mild trauma 2 weeks ago. X-ray is unremarkable. I will refer patient to orthopedics and have her follow with her primary care.          Wynetta Emery, PA-C 02/06/12 2007

## 2012-03-07 ENCOUNTER — Emergency Department (HOSPITAL_BASED_OUTPATIENT_CLINIC_OR_DEPARTMENT_OTHER): Payer: Medicare Other

## 2012-03-07 ENCOUNTER — Encounter (HOSPITAL_BASED_OUTPATIENT_CLINIC_OR_DEPARTMENT_OTHER): Payer: Self-pay | Admitting: *Deleted

## 2012-03-07 ENCOUNTER — Emergency Department (HOSPITAL_BASED_OUTPATIENT_CLINIC_OR_DEPARTMENT_OTHER)
Admission: EM | Admit: 2012-03-07 | Discharge: 2012-03-07 | Disposition: A | Discharge status: Home / Self Care | Payer: Medicare Other | Attending: Emergency Medicine | Admitting: Emergency Medicine

## 2012-03-07 DIAGNOSIS — M129 Arthropathy, unspecified: Secondary | ICD-10-CM | POA: Insufficient documentation

## 2012-03-07 DIAGNOSIS — H5440 Blindness, one eye, unspecified eye: Secondary | ICD-10-CM | POA: Insufficient documentation

## 2012-03-07 DIAGNOSIS — T83010A Breakdown (mechanical) of cystostomy catheter, initial encounter: Secondary | ICD-10-CM

## 2012-03-07 DIAGNOSIS — T8389XA Other specified complication of genitourinary prosthetic devices, implants and grafts, initial encounter: Secondary | ICD-10-CM | POA: Insufficient documentation

## 2012-03-07 DIAGNOSIS — E039 Hypothyroidism, unspecified: Secondary | ICD-10-CM | POA: Insufficient documentation

## 2012-03-07 DIAGNOSIS — Z79899 Other long term (current) drug therapy: Secondary | ICD-10-CM | POA: Insufficient documentation

## 2012-03-07 DIAGNOSIS — K219 Gastro-esophageal reflux disease without esophagitis: Secondary | ICD-10-CM | POA: Insufficient documentation

## 2012-03-07 DIAGNOSIS — E079 Disorder of thyroid, unspecified: Secondary | ICD-10-CM | POA: Insufficient documentation

## 2012-03-07 DIAGNOSIS — Y846 Urinary catheterization as the cause of abnormal reaction of the patient, or of later complication, without mention of misadventure at the time of the procedure: Secondary | ICD-10-CM | POA: Insufficient documentation

## 2012-03-07 DIAGNOSIS — G35 Multiple sclerosis: Secondary | ICD-10-CM | POA: Insufficient documentation

## 2012-03-07 LAB — URINALYSIS, MICROSCOPIC ONLY
Nitrite: NEGATIVE
Specific Gravity, Urine: 1.03 (ref 1.005–1.030)
pH: 7.5 (ref 5.0–8.0)

## 2012-03-07 NOTE — ED Notes (Signed)
Superpubic cath changed per protocol , 20 fr /10cc placed and irrigated with blood tinge urine returned

## 2012-03-07 NOTE — ED Notes (Signed)
Superpubic cathter was changed yesterday at home by homehealth , to day c/o not draining

## 2012-03-10 NOTE — ED Provider Notes (Signed)
History     CSN: [id number]  Arrival date & time 03/07/12  1325   First MD Initiated Contact with Patient 03/07/12 1424      Chief Complaint  Patient presents with  . Superpubic catheter problem     (Consider location/radiation/quality/duration/timing/severity/associated sxs/prior treatment) HPI [name] is a 61 y.o. female with MS with suprapubic catheter placed by Dr. Patsi Sears for chronic urinary incontinence and recurrent UTI from Foley.  Pt had catheter replaced yesterday due to leaking, but it has been leaking and not draining today.  Pt noted urine draining from urethra last 24 hours. Symptoms are constant, they have not improved or worsened.  Denies fever, chills, abdominal pain, nausea or vomiting.  Past Medical History  Diagnosis Date  . Urinary incontinence     neurogenic bladder--hx of uti's  . Urinary catheter in place     #18, 10 cc balloon  . Thyroid disease   . Multiple sclerosis   . GERD (gastroesophageal reflux disease)   . Asthma     as a young child-no problems since  . Hypothyroidism   . Anxiety   . Blindness of right eye     complication of ms  . Arthritis     old falls-with injuries to rt knee and both ankles and rt wrist--states she has some pain in these areas-probable arthritis  . Cellulitis     hx of cellulitis in lower extremities -requiring multiple hospitalzations in the past--no problems in las 2 yrs  . Neuromuscular disorder     ms-not ambulatory-muscle spasms-and recent tremors left arm and left  leg have developed--dr. Daphane Shepherd in [place] is pt's neurologist    Past Surgical History  Procedure Date  . Cholecystectomy   . Tonsillectomy   . Ankle surgery   . Wrist surgery   . Lymph gland excision   . Mediastinoscopy 04/24/10    for enlarged right paratracheal lymph node--surgery at [facility]-Dr. Burney--pt states she does not know the  what the pathology report showed.  . Adenoidectomy   . Fracture surgery 2005    orif left  ankle fx  . Cystoscopy 10/06/202012    Procedure: CYSTOSCOPY;  Surgeon: Kathi Ludwig, MD;  Location: [facility];  Service: Urology;  Laterality: N/A;    History reviewed. No pertinent family history.  History  Substance Use Topics  . Smoking status: Never Smoker   . Smokeless tobacco: Never Used  . Alcohol Use: No    OB History    Grav Para Term Preterm Abortions TAB SAB Ect Mult Living                  Review of Systems At least 10pt or greater review of systems completed and are negative except where specified in the HPI.  Allergies  Avelox; Ciprofloxacin hcl; Penicillins; and Shrimp  Home Medications   Current Outpatient Rx  Name Route Sig Dispense Refill  . ALPRAZOLAM 0.5 MG PO TABS Oral Take 0.5 mg by mouth 3 (three) times daily as needed. For anxiety    . ATORVASTATIN CALCIUM 20 MG PO TABS Oral Take 20 mg by mouth at bedtime.     Marland Kitchen BACLOFEN 20 MG PO TABS Oral Take 20 mg by mouth 4 (four) times daily.     Marland Kitchen CALCIUM CARBONATE 600 MG PO TABS Oral Take 600 mg by mouth 2 (two) times daily.     Marland Kitchen CRANBERRY 12600 MG PO CAPS Oral Take 1 capsule by mouth daily.    Marland Kitchen  DARIFENACIN HYDROBROMIDE ER 7.5 MG PO TB24 Oral Take 7.5 mg by mouth 2 (two) times daily.     Marland Kitchen ESOMEPRAZOLE MAGNESIUM 40 MG PO CPDR Oral Take 40 mg by mouth daily.     . FUROSEMIDE 20 MG PO TABS Oral Take 20 mg by mouth daily at 12 noon.     Marland Kitchen GABAPENTIN 400 MG PO CAPS Oral Take 800 mg by mouth 4 (four) times daily.     . DUODERM HYDROACTIVE EX Apply externally Apply 1 patch topically as needed. For sores    . LEVOTHYROXINE SODIUM 88 MCG PO TABS Oral Take 88 mcg by mouth every morning.     . MULTI-VITAMIN/MINERALS PO TABS Oral Take 1 tablet by mouth daily.     Marland Kitchen PRESCRIPTION MEDICATION Topical Apply 1 application topically daily. Barrier cream    . SENNA-DOCUSATE SODIUM 8.6-50 MG PO TABS Oral Take 1 tablet by mouth daily.     . SERTRALINE HCL 25 MG PO TABS Oral Take 25 mg by mouth every morning.     .  TETRAHYDROZOLINE HCL 0.05 % OP SOLN Both Eyes Place 1 drop into both eyes 2 (two) times daily as needed. DRY EYES    . VITAMIN D 400 UNITS PO TABS Oral Take 400 Units by mouth 2 (two) times daily.       BP 136/106  Pulse 128  Temp 97.9 F (36.6 C) (Oral)  Resp 18  Ht 5\' 2"  (1.575 m)  Wt 240 lb (108.863 kg)  BMI 43.90 kg/m2  SpO2 92%  Physical Exam    Nursing notes reviewed.  Electronic medical record reviewed. VITAL SIGNS:   Filed Vitals:   03/07/12 1332 03/07/12 1900  BP: 156/80 136/106  Pulse: 101 128  Temp: 97.9 F (36.6 C)   TempSrc: Oral   Resp: 18   Height: 5\' 2"  (1.575 m)   Weight: 240 lb (108.863 kg)   SpO2: 96% 92%   CONSTITUTIONAL: Awake, oriented, appears non-toxic HENT: Atraumatic, normocephalic, oral mucosa pink and moist, airway patent. Nares patent without drainage. External ears normal. EYES: Conjunctiva clear, EOMI, PERRLA NECK: Trachea midline, non-tender, supple CARDIOVASCULAR: Normal heart rate, Normal rhythm, No murmurs, rubs, gallops PULMONARY/CHEST: Clear to auscultation, no rhonchi, wheezes, or rales. Symmetrical breath sounds. Non-tender. ABDOMINAL: Non-distended, morbidly obese, soft, non-tender - no rebound or guarding.  BS normal. Ca.rosnetheter does not move freely in and out and does not rotate freely. NEUROLOGIC: Non-focal, moving all four extremities, diffuse weakness secondary to MS EXTREMITIES: No clubbing, cyanosis, or edema SKIN: Warm, Dry, No erythema, No rash ED Course  Procedures (including critical care time)  Labs Reviewed  URINALYSIS, WITH MICROSCOPIC - Abnormal; Notable for the following:    APPearance TURBID (*)     Hgb urine dipstick SMALL (*)     Bilirubin Urine SMALL (*)     Protein, ur 30 (*)     Leukocytes, UA LARGE (*)     Bacteria, UA MANY (*)     Squamous Epithelial / LPF FEW (*)     All other components within normal limits   No results found.   1. Suprapubic catheter dysfunction       MDM  [name] is a 61 y.o. female presenting with suprapubic catheter dysfunction.  RN replaced catherter, but position was not confirmed with flush - checked KUB with retrograde dye instillation showing the catheter is in the catheter tract and not in the bladder.  Discussed with urology. Placed Foley and removed  SP catheter - pt will see Dr. Patsi Sears next week. I explained the diagnosis and have given explicit precautions to return to the ER including dysuria, fevers or chills or any other new or worsening symptoms. The patient understands and accepts the medical plan as it's been dictated and I have answered their questions. Discharge instructions concerning home care and prescriptions have been given.  The patient is STABLE and is discharged to home in good condition.         Jones Skene, MD 03/10/12 [phone]

## 2013-05-08 ENCOUNTER — Encounter (HOSPITAL_BASED_OUTPATIENT_CLINIC_OR_DEPARTMENT_OTHER): Payer: Self-pay | Admitting: Emergency Medicine

## 2013-05-08 ENCOUNTER — Emergency Department (HOSPITAL_BASED_OUTPATIENT_CLINIC_OR_DEPARTMENT_OTHER): Payer: Medicare Other

## 2013-05-08 ENCOUNTER — Inpatient Hospital Stay (HOSPITAL_BASED_OUTPATIENT_CLINIC_OR_DEPARTMENT_OTHER)
Admission: EM | Admit: 2013-05-08 | Discharge: 2013-05-11 | DRG: 872 | Disposition: A | Discharge status: Home / Self Care | Payer: Medicare Other | Attending: Internal Medicine | Admitting: Internal Medicine

## 2013-05-08 DIAGNOSIS — M129 Arthropathy, unspecified: Secondary | ICD-10-CM | POA: Diagnosis present

## 2013-05-08 DIAGNOSIS — Z87440 Personal history of urinary (tract) infections: Secondary | ICD-10-CM

## 2013-05-08 DIAGNOSIS — R509 Fever, unspecified: Secondary | ICD-10-CM

## 2013-05-08 DIAGNOSIS — H5440 Blindness, one eye, unspecified eye: Secondary | ICD-10-CM | POA: Diagnosis present

## 2013-05-08 DIAGNOSIS — E039 Hypothyroidism, unspecified: Secondary | ICD-10-CM | POA: Diagnosis present

## 2013-05-08 DIAGNOSIS — R7881 Bacteremia: Secondary | ICD-10-CM | POA: Diagnosis present

## 2013-05-08 DIAGNOSIS — Z79899 Other long term (current) drug therapy: Secondary | ICD-10-CM

## 2013-05-08 DIAGNOSIS — E860 Dehydration: Secondary | ICD-10-CM

## 2013-05-08 DIAGNOSIS — E119 Type 2 diabetes mellitus without complications: Secondary | ICD-10-CM | POA: Diagnosis present

## 2013-05-08 DIAGNOSIS — Z23 Encounter for immunization: Secondary | ICD-10-CM

## 2013-05-08 DIAGNOSIS — N390 Urinary tract infection, site not specified: Secondary | ICD-10-CM | POA: Diagnosis present

## 2013-05-08 DIAGNOSIS — Z7401 Bed confinement status: Secondary | ICD-10-CM

## 2013-05-08 DIAGNOSIS — A419 Sepsis, unspecified organism: Principal | ICD-10-CM | POA: Diagnosis present

## 2013-05-08 DIAGNOSIS — G35 Multiple sclerosis: Secondary | ICD-10-CM | POA: Diagnosis present

## 2013-05-08 DIAGNOSIS — K219 Gastro-esophageal reflux disease without esophagitis: Secondary | ICD-10-CM | POA: Diagnosis present

## 2013-05-08 DIAGNOSIS — F411 Generalized anxiety disorder: Secondary | ICD-10-CM | POA: Diagnosis present

## 2013-05-08 DIAGNOSIS — N319 Neuromuscular dysfunction of bladder, unspecified: Secondary | ICD-10-CM | POA: Diagnosis present

## 2013-05-08 LAB — COMPREHENSIVE METABOLIC PANEL
ALT: 28 U/L (ref 0–35)
Albumin: 3.8 g/dL (ref 3.5–5.2)
Alkaline Phosphatase: 133 U/L — ABNORMAL HIGH (ref 39–117)
Calcium: 9.6 mg/dL (ref 8.4–10.5)
Potassium: 3.9 mEq/L (ref 3.5–5.1)
Sodium: 139 mEq/L (ref 135–145)
Total Protein: 7.4 g/dL (ref 6.0–8.3)

## 2013-05-08 LAB — URINE MICROSCOPIC-ADD ON

## 2013-05-08 LAB — CBC WITH DIFFERENTIAL/PLATELET
Basophils Absolute: 0.1 10*3/uL (ref 0.0–0.1)
Basophils Relative: 0 % (ref 0–1)
Eosinophils Absolute: 0 10*3/uL (ref 0.0–0.7)
Eosinophils Relative: 0 % (ref 0–5)
Lymphs Abs: 0.5 10*3/uL — ABNORMAL LOW (ref 0.7–4.0)
MCH: 30.9 pg (ref 26.0–34.0)
MCHC: 32.8 g/dL (ref 30.0–36.0)
MCV: 94.2 fL (ref 78.0–100.0)
Neutrophils Relative %: 91 % — ABNORMAL HIGH (ref 43–77)
Platelets: 267 10*3/uL (ref 150–400)
RBC: 4.85 MIL/uL (ref 3.87–5.11)
RDW: 13.9 % (ref 11.5–15.5)

## 2013-05-08 LAB — URINALYSIS, ROUTINE W REFLEX MICROSCOPIC
Glucose, UA: NEGATIVE mg/dL
Ketones, ur: 40 mg/dL — AB
Nitrite: POSITIVE — AB
Specific Gravity, Urine: 1.019 (ref 1.005–1.030)
pH: 6 (ref 5.0–8.0)

## 2013-05-08 MED ORDER — CEFEPIME HCL 1 G IJ SOLR
INTRAMUSCULAR | Status: AC
  Filled 2013-05-08: qty 1

## 2013-05-08 MED ORDER — SODIUM CHLORIDE 0.9 % IV BOLUS (SEPSIS)
1000.0000 mL | Freq: Once | INTRAVENOUS | Status: AC
  Administered 2013-05-08: 1000 mL via INTRAVENOUS

## 2013-05-08 MED ORDER — ACETAMINOPHEN 325 MG PO TABS
650.0000 mg | ORAL_TABLET | Freq: Once | ORAL | Status: AC
  Administered 2013-05-08: 650 mg via ORAL
  Filled 2013-05-08: qty 2

## 2013-05-08 MED ORDER — DEXTROSE 5 % IV SOLN
1.0000 g | Freq: Two times a day (BID) | INTRAVENOUS | Status: DC
  Administered 2013-05-08: 1 g via INTRAVENOUS
  Filled 2013-05-08 (×2): qty 1

## 2013-05-08 NOTE — ED Provider Notes (Signed)
CSN: [id number]     Arrival date & time 05/08/13  2111 History  This chart was scribed for Rolan Bucco, MD by Danella Maiers, ED Scribe. This patient was seen in room MH02/MH02 and the patient's care was started at 9:23 PM.   Chief Complaint  Patient presents with  . Weakness  . Dysuria   Patient is a 62 y.o. female presenting with weakness and dysuria. The history is provided by the patient. No language interpreter was used.  Weakness This is a recurrent problem. The current episode started 3 to 5 hours ago. The problem has not changed since onset.Pertinent negatives include no chest pain, no abdominal pain, no headaches and no shortness of breath. Nothing aggravates the symptoms. Nothing relieves the symptoms. She has tried nothing for the symptoms.  Dysuria Associated symptoms: no abdominal pain, no fever, no flank pain, no nausea and no vomiting    HPI Comments: [name] is a 62 y.o. female with a h/o MS who presents to the Emergency Department complaining of generalized weakness that started today with subjective fever. She states it feels similar to MS flare ups in the past. She reports she is bedridden due to MS but is able to get up in a wheelchair. Today, she has not been able to sit up in a chair.  She also reports dysuria and states it feels the same as prior UTIs. She had a catheter in for a long time that was taken out one week ago. She is blind in the right eye at baseline. The last time she was admitted was 3-4 years ago. She denies cough, congestion, rash, sores, loss of vision, vomiting, diarrhea.    Past Medical History  Diagnosis Date  . Urinary incontinence     neurogenic bladder--hx of uti's  . Thyroid disease   . Multiple sclerosis   . GERD (gastroesophageal reflux disease)   . Asthma     as a young child-no problems since  . Hypothyroidism   . Anxiety   . Blindness of right eye     complication of ms  . Arthritis     old falls-with injuries to rt knee  and both ankles and rt wrist--states she has some pain in these areas-probable arthritis  . Cellulitis     hx of cellulitis in lower extremities -requiring multiple hospitalzations in the past--no problems in las 2 yrs  . Neuromuscular disorder     ms-not ambulatory-muscle spasms-and recent tremors left arm and left  leg have developed--dr. Daphane Shepherd in [place] is pt's neurologist   Past Surgical History  Procedure Laterality Date  . Cholecystectomy    . Tonsillectomy    . Ankle surgery    . Wrist surgery    . Lymph gland excision    . Mediastinoscopy  04/24/10    for enlarged right paratracheal lymph node--surgery at [facility]-Dr. Burney--pt states she does not know the  what the pathology report showed.  . Adenoidectomy    . Fracture surgery  2005    orif left ankle fx  . Cystoscopy  07/02/2011    Procedure: CYSTOSCOPY;  Surgeon: Kathi Ludwig, MD;  Location: [facility];  Service: Urology;  Laterality: N/A;   No family history on file. History  Substance Use Topics  . Smoking status: Never Smoker   . Smokeless tobacco: Never Used  . Alcohol Use: No   OB History   Grav Para Term Preterm Abortions TAB SAB Ect Mult Living  Review of Systems  Constitutional: Negative for fever, chills, diaphoresis and fatigue.  HENT: Negative for congestion, rhinorrhea and sneezing.   Eyes: Negative.   Respiratory: Negative for cough, chest tightness and shortness of breath.   Cardiovascular: Negative for chest pain and leg swelling.  Gastrointestinal: Negative for nausea, vomiting, abdominal pain, diarrhea and blood in stool.  Genitourinary: Positive for dysuria. Negative for frequency, hematuria, flank pain and difficulty urinating.  Musculoskeletal: Negative for arthralgias and back pain.  Skin: Negative for rash.  Neurological: Positive for weakness. Negative for dizziness, speech difficulty, numbness and headaches.    Allergies  Avelox; Ciprofloxacin hcl; Penicillins;  and Shrimp  Home Medications   Current Outpatient Rx  Name  Route  Sig  Dispense  Refill  . atorvastatin (LIPITOR) 20 MG tablet   Oral   Take 20 mg by mouth at bedtime.          . baclofen (LIORESAL) 20 MG tablet   Oral   Take 20 mg by mouth 4 (four) times daily.          . Cranberry 12600 MG CAPS   Oral   Take 1 capsule by mouth daily.         Marland Kitchen darifenacin (ENABLEX) 7.5 MG 24 hr tablet   Oral   Take 7.5 mg by mouth 2 (two) times daily.          Marland Kitchen esomeprazole (NEXIUM) 40 MG capsule   Oral   Take 40 mg by mouth daily.          . furosemide (LASIX) 20 MG tablet   Oral   Take 20 mg by mouth daily at 12 noon.          . gabapentin (NEURONTIN) 400 MG capsule   Oral   Take 800 mg by mouth 4 (four) times daily.          Marland Kitchen levothyroxine (SYNTHROID, LEVOTHROID) 88 MCG tablet   Oral   Take 88 mcg by mouth every morning.          . Multiple Vitamins-Minerals (MULTIVITAMIN WITH MINERALS) tablet   Oral   Take 1 tablet by mouth daily.          Marland Kitchen PRESCRIPTION MEDICATION   Topical   Apply 1 application topically daily. Barrier cream         . ALPRAZolam (XANAX) 0.5 MG tablet   Oral   Take 0.5 mg by mouth 3 (three) times daily as needed. For anxiety         . calcium carbonate (OS-CAL) 600 MG TABS   Oral   Take 600 mg by mouth 2 (two) times daily.          . Hydroactive Dressings (DUODERM HYDROACTIVE EX)   Apply externally   Apply 1 patch topically as needed. For sores         . sennosides-docusate sodium (SENOKOT-S) 8.6-50 MG tablet   Oral   Take 1 tablet by mouth daily.          . sertraline (ZOLOFT) 25 MG tablet   Oral   Take 25 mg by mouth every morning.          Marland Kitchen tetrahydrozoline 0.05 % ophthalmic solution   Both Eyes   Place 1 drop into both eyes 2 (two) times daily as needed. DRY EYES         . vitamin D, CHOLECALCIFEROL, 400 UNITS tablet   Oral   Take 400 Units by mouth 2 (two)  times daily.           BP 142/69   Pulse 126  Temp(Src) 101.1 F (38.4 C) (Rectal)  Resp 20  SpO2 95% Physical Exam  Nursing note and vitals reviewed. Constitutional: She is oriented to person, place, and time. She appears well-developed and well-nourished.  HENT:  Head: Normocephalic and atraumatic.  Slightly dry mucus membranes  Eyes: Pupils are equal, round, and reactive to light.  Neck: Normal range of motion. Neck supple.  Cardiovascular: Normal rate, regular rhythm and normal heart sounds.   Pulmonary/Chest: Effort normal and breath sounds normal. No respiratory distress. She has no wheezes. She has no rales. She exhibits no tenderness.  Abdominal: Soft. Bowel sounds are normal. There is no tenderness. There is no rebound and no guarding.  Musculoskeletal: Normal range of motion. She exhibits no edema.  Lymphadenopathy:    She has no cervical adenopathy.  Neurological: She is alert and oriented to person, place, and time.  Generalized weakness with 3/5 strength in upper extremities and 1/5  Strength in lower extremities  Skin: Skin is warm and dry. No rash noted.  3cm pressure sore to the left buttocks does not appear to have signs of infection  Psychiatric: She has a normal mood and affect.    ED Course  Procedures (including critical care time) Medications  sodium chloride 0.9 % bolus 1,000 mL (1,000 mLs Intravenous New Bag/Given 05/08/13 2224)  ceFEPIme (MAXIPIME) 1 g in dextrose 5 % 50 mL IVPB (not administered)  acetaminophen (TYLENOL) tablet 650 mg (650 mg Oral Given 05/08/13 2204)    DIAGNOSTIC STUDIES: Oxygen Saturation is 95% on RA, normal by my interpretation.    COORDINATION OF CARE: 9:35 PM- Discussed treatment plan with pt which includes CXR, blood work, UA. Pt agrees to plan.    Labs Review Labs Reviewed  CBC WITH DIFFERENTIAL - Abnormal; Notable for the following:    WBC 19.1 (*)    Neutrophils Relative % 91 (*)    Neutro Abs 17.3 (*)    Lymphocytes Relative 3 (*)    Lymphs Abs 0.5  (*)    Monocytes Absolute 1.2 (*)    All other components within normal limits  COMPREHENSIVE METABOLIC PANEL - Abnormal; Notable for the following:    Glucose, Bld 171 (*)    Alkaline Phosphatase 133 (*)    All other components within normal limits  URINALYSIS, ROUTINE W REFLEX MICROSCOPIC - Abnormal; Notable for the following:    Color, Urine AMBER (*)    APPearance TURBID (*)    Hgb urine dipstick MODERATE (*)    Bilirubin Urine SMALL (*)    Ketones, ur 40 (*)    Protein, ur 30 (*)    Nitrite POSITIVE (*)    Leukocytes, UA LARGE (*)    All other components within normal limits  URINE MICROSCOPIC-ADD ON - Abnormal; Notable for the following:    Squamous Epithelial / LPF FEW (*)    Bacteria, UA MANY (*)    All other components within normal limits  URINE CULTURE  CULTURE, BLOOD (ROUTINE X 2)  CULTURE, BLOOD (ROUTINE X 2)  CG4 I-STAT (LACTIC ACID)   Imaging Review No results found.  EKG Interpretation   None       MDM   1. UTI (lower urinary tract infection)   2. Fever   3. Dehydration    Patient with hx of MS presents with fever and increased weakness. She has evidence of a urinary tract infection  which is likely a source of infection. She has markedly increased weakness related to her MS which is likely due to the infection. She was given IV fluids and antibiotics in the emergency department. Her blood pressures remained stable in her lactic acid is negative. There is no other symptoms suggestive of sepsis. However given her increase weakness with associated UTI I did feel that she should be admitted for IV antibiotics and monitoring. I will contact the hospitalist for admission and transfer to [facility].     I personally performed the services described in this documentation, which was scribed in my presence.  The recorded information has been reviewed and considered.    Rolan Bucco, MD 05/08/13 2308

## 2013-05-08 NOTE — ED Notes (Signed)
Assigned to bed 4E23 @[facility] , [facility] called for transport, rn notified.

## 2013-05-08 NOTE — ED Notes (Signed)
Patient transported to X-ray 

## 2013-05-08 NOTE — ED Notes (Signed)
Per EMS - pt from home - baseline bedridden secondary to MS - c/o generalized weakness that started today - pt also c/o dysuria and thinks she may have a UTI.

## 2013-05-08 NOTE — ED Notes (Signed)
Contact # for pt's brother [name] [phone]

## 2013-05-09 ENCOUNTER — Encounter (HOSPITAL_COMMUNITY): Payer: Self-pay | Admitting: Nurse Practitioner

## 2013-05-09 DIAGNOSIS — N390 Urinary tract infection, site not specified: Secondary | ICD-10-CM | POA: Diagnosis present

## 2013-05-09 DIAGNOSIS — E119 Type 2 diabetes mellitus without complications: Secondary | ICD-10-CM | POA: Diagnosis present

## 2013-05-09 DIAGNOSIS — K219 Gastro-esophageal reflux disease without esophagitis: Secondary | ICD-10-CM | POA: Diagnosis present

## 2013-05-09 DIAGNOSIS — H5440 Blindness, one eye, unspecified eye: Secondary | ICD-10-CM | POA: Diagnosis present

## 2013-05-09 DIAGNOSIS — E039 Hypothyroidism, unspecified: Secondary | ICD-10-CM

## 2013-05-09 DIAGNOSIS — G35 Multiple sclerosis: Secondary | ICD-10-CM

## 2013-05-09 DIAGNOSIS — R509 Fever, unspecified: Secondary | ICD-10-CM

## 2013-05-09 DIAGNOSIS — E860 Dehydration: Secondary | ICD-10-CM

## 2013-05-09 LAB — PROTIME-INR
INR: 1.08 (ref 0.00–1.49)
Prothrombin Time: 13.8 seconds (ref 11.6–15.2)

## 2013-05-09 LAB — GLUCOSE, CAPILLARY
Glucose-Capillary: 111 mg/dL — ABNORMAL HIGH (ref 70–99)
Glucose-Capillary: 154 mg/dL — ABNORMAL HIGH (ref 70–99)
Glucose-Capillary: 113 mg/dL — ABNORMAL HIGH (ref 70–99)
Glucose-Capillary: 108 mg/dL — ABNORMAL HIGH (ref 70–99)

## 2013-05-09 LAB — CBC
HCT: 40.4 % (ref 36.0–46.0)
Hemoglobin: 13.3 g/dL (ref 12.0–15.0)
MCH: 30.9 pg (ref 26.0–34.0)
MCHC: 32.9 g/dL (ref 30.0–36.0)
RDW: 13.6 % (ref 11.5–15.5)

## 2013-05-09 LAB — COMPREHENSIVE METABOLIC PANEL
ALT: 23 U/L (ref 0–35)
AST: 23 U/L (ref 0–37)
CO2: 26 mEq/L (ref 19–32)
Calcium: 8.9 mg/dL (ref 8.4–10.5)
GFR calc non Af Amer: >90 mL/min (ref >90)
Sodium: 144 mEq/L (ref 135–145)
Total Protein: 6.4 g/dL (ref 6.0–8.3)

## 2013-05-09 MED ORDER — ZOLPIDEM TARTRATE 5 MG PO TABS
5.0000 mg | ORAL_TABLET | Freq: Every evening | ORAL | Status: DC | PRN
  Administered 2013-05-09 – 2013-05-10 (×3): 5 mg via ORAL
  Filled 2013-05-09 (×3): qty 1

## 2013-05-09 MED ORDER — POTASSIUM CHLORIDE CRYS ER 20 MEQ PO TBCR
40.0000 meq | EXTENDED_RELEASE_TABLET | Freq: Once | ORAL | Status: AC
  Administered 2013-05-09: 17:00:00 40 meq via ORAL
  Filled 2013-05-09: qty 2

## 2013-05-09 MED ORDER — LEVOTHYROXINE SODIUM 88 MCG PO TABS
88.0000 ug | ORAL_TABLET | Freq: Every day | ORAL | Status: DC
  Administered 2013-05-09 – 2013-05-11 (×3): 88 ug via ORAL
  Filled 2013-05-09 (×4): qty 1

## 2013-05-09 MED ORDER — INSULIN ASPART 100 UNIT/ML ~~LOC~~ SOLN: 0.0000 [IU] | Freq: Every day | SUBCUTANEOUS | Status: DC

## 2013-05-09 MED ORDER — SODIUM CHLORIDE 0.9 % IV SOLN
INTRAVENOUS | Status: AC
  Administered 2013-05-09: 04:00:00 via INTRAVENOUS

## 2013-05-09 MED ORDER — INSULIN ASPART 100 UNIT/ML ~~LOC~~ SOLN
0.0000 [IU] | Freq: Three times a day (TID) | SUBCUTANEOUS | Status: DC
  Administered 2013-05-09: 2 [IU] via SUBCUTANEOUS
  Administered 2013-05-11: 13:00:00 1 [IU] via SUBCUTANEOUS

## 2013-05-09 MED ORDER — ONDANSETRON HCL 4 MG/2ML IJ SOLN
4.0000 mg | Freq: Four times a day (QID) | INTRAMUSCULAR | Status: DC | PRN
  Administered 2013-05-09: 4 mg via INTRAVENOUS
  Filled 2013-05-09: qty 2

## 2013-05-09 MED ORDER — GABAPENTIN 400 MG PO CAPS
800.0000 mg | ORAL_CAPSULE | Freq: Four times a day (QID) | ORAL | Status: DC
  Administered 2013-05-09 – 2013-05-11 (×11): 800 mg via ORAL
  Filled 2013-05-09 (×13): qty 2

## 2013-05-09 MED ORDER — BIOTENE DRY MOUTH MT LIQD
15.0000 mL | Freq: Two times a day (BID) | OROMUCOSAL | Status: DC
  Administered 2013-05-09 – 2013-05-11 (×6): 15 mL via OROMUCOSAL

## 2013-05-09 MED ORDER — SODIUM CHLORIDE 0.9 % IJ SOLN
3.0000 mL | Freq: Two times a day (BID) | INTRAMUSCULAR | Status: DC
  Administered 2013-05-09 – 2013-05-11 (×6): 3 mL via INTRAVENOUS

## 2013-05-09 MED ORDER — ACETAMINOPHEN 650 MG RE SUPP: 650.0000 mg | Freq: Four times a day (QID) | RECTAL | Status: DC | PRN

## 2013-05-09 MED ORDER — INFLUENZA VAC SPLIT QUAD 0.5 ML IM SUSP
0.5000 mL | INTRAMUSCULAR | Status: AC
  Administered 2013-05-10: 10:00:00 0.5 mL via INTRAMUSCULAR
  Filled 2013-05-09: qty 0.5

## 2013-05-09 MED ORDER — ENOXAPARIN SODIUM 40 MG/0.4ML ~~LOC~~ SOLN
40.0000 mg | SUBCUTANEOUS | Status: DC
  Administered 2013-05-09 – 2013-05-10 (×2): 40 mg via SUBCUTANEOUS
  Filled 2013-05-09 (×3): qty 0.4

## 2013-05-09 MED ORDER — ACETAMINOPHEN 325 MG PO TABS
650.0000 mg | ORAL_TABLET | Freq: Four times a day (QID) | ORAL | Status: DC | PRN
  Administered 2013-05-11: 650 mg via ORAL
  Filled 2013-05-09 (×2): qty 2

## 2013-05-09 MED ORDER — DEXTROSE 5 % IV SOLN
1.0000 g | Freq: Two times a day (BID) | INTRAVENOUS | Status: DC
  Administered 2013-05-09 – 2013-05-10 (×3): 1 g via INTRAVENOUS
  Filled 2013-05-09 (×4): qty 1

## 2013-05-09 MED ORDER — ONDANSETRON HCL 4 MG PO TABS: 4.0000 mg | ORAL_TABLET | Freq: Four times a day (QID) | ORAL | Status: DC | PRN

## 2013-05-09 MED ORDER — BACLOFEN 20 MG PO TABS
20.0000 mg | ORAL_TABLET | Freq: Four times a day (QID) | ORAL | Status: DC
  Administered 2013-05-09 – 2013-05-11 (×12): 20 mg via ORAL
  Filled 2013-05-09 (×13): qty 1

## 2013-05-09 MED ORDER — PANTOPRAZOLE SODIUM 40 MG PO TBEC
40.0000 mg | DELAYED_RELEASE_TABLET | Freq: Every day | ORAL | Status: DC
  Administered 2013-05-09 – 2013-05-11 (×3): 40 mg via ORAL
  Filled 2013-05-09 (×2): qty 1

## 2013-05-09 MED ORDER — ATORVASTATIN CALCIUM 20 MG PO TABS
20.0000 mg | ORAL_TABLET | Freq: Every day | ORAL | Status: DC
  Administered 2013-05-09 – 2013-05-10 (×2): 20 mg via ORAL
  Filled 2013-05-09 (×3): qty 1

## 2013-05-09 MED ORDER — DARIFENACIN HYDROBROMIDE ER 7.5 MG PO TB24
7.5000 mg | ORAL_TABLET | Freq: Two times a day (BID) | ORAL | Status: DC
  Administered 2013-05-09 – 2013-05-11 (×5): 7.5 mg via ORAL
  Filled 2013-05-09 (×6): qty 1

## 2013-05-09 NOTE — Progress Notes (Signed)
Agree with above assessment

## 2013-05-09 NOTE — Progress Notes (Signed)
62yo female c/o weakness and dysuria, has h/o pseudomonas, no evidence of sepsis, to begin IV ABX for UTI.  Will begin cefepime 1g IV Q12H and monitor CBC, Cx.  Vernard Gambles, PharmD, BCPS 05/09/2013 2:08 AM

## 2013-05-09 NOTE — H&P (Signed)
[facility] History and Physical  Patient: [name]  WGN:[id number]  DOB: [birth year]  DOS: 05/09/2013 PCP: Florentina Jenny, MD  Chief Complaint: Generalized weakness suprapubic pain  HPI: [name] is a 62 y.o. female with Past medical history of multiple sclerosis, neurogenic bladder with history of recurrent UTI, hypothyroidism, right eye blindness, bilateral leg swelling. The patient is coming from home and transferred from med [facility]. She presented to med Center with the complaint of generalized weakness that has been ongoing since today. She mentions that she was so weak that she could not even stand up or lift her hand. She didn't feel febrile but has not measured her temperature. She was nauseated but no vomiting. She did complain of mild suprapubic pain but denies any burning urination. She denies any burning urination he went with her prior UTIs. There was no cough, chest pain, abdominal pain else where, diarrhea. She wishes she is to live because of medications and there is no change in her medications recently.  Review of Systems: as mentioned in the history of present illness.  A Comprehensive review of the other systems is negative.  Past Medical History  Diagnosis Date  . Urinary incontinence     neurogenic bladder--hx of uti's  . Thyroid disease   . Multiple sclerosis   . GERD (gastroesophageal reflux disease)   . Hypothyroidism   . Anxiety   . Blindness of right eye     complication of ms  . Arthritis     old falls-with injuries to rt knee and both ankles and rt wrist--states she has some pain in these areas-probable arthritis  . Cellulitis     hx of cellulitis in lower extremities -requiring multiple hospitalzations in the past--no problems in las 2 yrs  . Neuromuscular disorder     ms-not ambulatory-muscle spasms-and recent tremors left arm and left  leg have developed--dr. Daphane Shepherd in [place] is pt's neurologist  . Diabetes  mellitus without complication    Past Surgical History  Procedure Laterality Date  . Cholecystectomy    . Tonsillectomy    . Ankle surgery    . Wrist surgery    . Lymph gland excision    . Mediastinoscopy  04/24/10    for enlarged right paratracheal lymph node--surgery at [facility]-Dr. Burney--pt states she does not know the  what the pathology report showed.  . Adenoidectomy    . Fracture surgery  2005    orif left ankle fx  . Cystoscopy  07/02/2011    Procedure: CYSTOSCOPY;  Surgeon: Kathi Ludwig, MD;  Location: [facility];  Service: Urology;  Laterality: N/A;   Social History:  reports that she has never smoked. She has never used smokeless tobacco. She reports that she does not drink alcohol or use illicit drugs. Partiality Independent for most of her  ADL, gets help from caregivers  Allergies  Allergen Reactions  . Avelox [Moxifloxacin Hcl In Nacl] Hives and Other (See Comments)    Panic attack  . Ciprofloxacin Hcl Hives and Other (See Comments)    Panic attack  . Penicillins Hives  . Shrimp [Shellfish Allergy] Other (See Comments)    flushing    History reviewed. No pertinent family history.  Prior to Admission medications   Medication Sig Start Date End Date Taking? Authorizing Provider  atorvastatin (LIPITOR) 20 MG tablet Take 20 mg by mouth at bedtime.    Yes Historical Provider, MD  baclofen (LIORESAL) 20 MG tablet Take 20 mg  by mouth 4 (four) times daily.    Yes Historical Provider, MD  Cranberry 12600 MG CAPS Take 1 capsule by mouth daily.   Yes Historical Provider, MD  darifenacin (ENABLEX) 7.5 MG 24 hr tablet Take 7.5 mg by mouth 2 (two) times daily.    Yes Historical Provider, MD  esomeprazole (NEXIUM) 40 MG capsule Take 40 mg by mouth daily.    Yes Historical Provider, MD  furosemide (LASIX) 20 MG tablet Take 20 mg by mouth daily at 12 noon.    Yes Historical Provider, MD  gabapentin (NEURONTIN) 400 MG capsule Take 800 mg by mouth 4 (four) times daily.    Yes  Historical Provider, MD  levothyroxine (SYNTHROID, LEVOTHROID) 88 MCG tablet Take 88 mcg by mouth every morning.    Yes Historical Provider, MD  METFORMIN HCL ER, MOD, PO Take by mouth.   Yes Historical Provider, MD  Multiple Vitamins-Minerals (MULTIVITAMIN WITH MINERALS) tablet Take 1 tablet by mouth daily.    Yes Historical Provider, MD  PRESCRIPTION MEDICATION Apply 1 application topically daily. Barrier cream   Yes Historical Provider, MD  ALPRAZolam Prudy Feeler) 0.5 MG tablet Take 0.5 mg by mouth 3 (three) times daily as needed. For anxiety    Historical Provider, MD  calcium carbonate (OS-CAL) 600 MG TABS Take 600 mg by mouth 2 (two) times daily.     Historical Provider, MD  Hydroactive Dressings (DUODERM HYDROACTIVE EX) Apply 1 patch topically as needed. For sores    Historical Provider, MD  sennosides-docusate sodium (SENOKOT-S) 8.6-50 MG tablet Take 1 tablet by mouth daily.     Historical Provider, MD  sertraline (ZOLOFT) 25 MG tablet Take 25 mg by mouth every morning.     Historical Provider, MD  tetrahydrozoline 0.05 % ophthalmic solution Place 1 drop into both eyes 2 (two) times daily as needed. DRY EYES    Historical Provider, MD  vitamin D, CHOLECALCIFEROL, 400 UNITS tablet Take 400 Units by mouth 2 (two) times daily.     Historical Provider, MD    Physical Exam: Filed Vitals:   05/08/13 2116 05/08/13 2325 05/09/13 0100 05/09/13 0109  BP: 142/69 119/56  118/50  Pulse: 126 106  99  Temp: 101.1 F (38.4 C) 98 F (36.7 C)  97.9 F (36.6 C)  TempSrc: Rectal Oral  Oral  Resp: 20 18  20   Height:   5\' 2"  (1.575 m)   Weight:   101.7 kg (224 lb 3.3 oz)   SpO2: 95% 96%  100%    General: Alert, Awake and Oriented to Time, Place and Person. Appear in mild distress Eyes: PERRL ENT: Oral Mucosa clear moist. Neck: no JVD Cardiovascular: S1 and S2 Present, no Murmur, Peripheral Pulses Present Respiratory: Bilateral Air entry equal and Decreased, Clear to Auscultation,  no Crackles,no  wheezes Abdomen: Bowel Sound Present, Soft and Non tender Skin: no Rash Extremities: Bilateral Pedal edema, no calf tenderness Neurologic: Grossly Unremarkable.  Labs on Admission:  CBC:  Recent Labs Lab 05/08/13 2200  WBC 19.1*  NEUTROABS 17.3*  HGB 15.0  HCT 45.7  MCV 94.2  PLT 267    CMP     Component Value Date/Time   NA 139 05/08/2013 2200   K 3.9 05/08/2013 2200   CL 99 05/08/2013 2200   CO2 27 05/08/2013 2200   GLUCOSE 171* 05/08/2013 2200   BUN 12 05/08/2013 2200   CREATININE 0.50 05/08/2013 2200   CALCIUM 9.6 05/08/2013 2200   PROT 7.4 05/08/2013 2200   ALBUMIN  3.8 05/08/2013 2200   AST 29 05/08/2013 2200   ALT 28 05/08/2013 2200   ALKPHOS 133* 05/08/2013 2200   BILITOT 0.8 05/08/2013 2200   GFRNONAA >90 05/08/2013 2200   GFRAA >90 05/08/2013 2200    No results found for this basename: LIPASE, AMYLASE,  in the last 168 hours No results found for this basename: AMMONIA,  in the last 168 hours  No results found for this basename: CKTOTAL, CKMB, CKMBINDEX, TROPONINI,  in the last 168 hours BNP (last 3 results) No results found for this basename: PROBNP,  in the last 8760 hours  Radiological Exams on Admission: Dg Chest 2 View  05/09/2013   CLINICAL DATA:  Generalized weakness; fever and dysuria.  EXAM: CHEST  2 VIEW  COMPARISON:  Chest radiograph performed 07/02/2011  FINDINGS: The lungs are relatively well expanded. Mild vascular congestion is noted, without definite pulmonary edema. There is no evidence of pleural effusion or pneumothorax.  The heart is borderline enlarged. No acute osseous abnormalities are seen.  IMPRESSION: Mild vascular congestion and borderline cardiomegaly noted; lungs remain grossly clear.   Electronically Signed   By: Roanna Raider M.D.   On: 05/09/2013 00:01    EKG: Independently reviewed. nonspecific ST and T waves changes.  Assessment/Plan Principal Problem:   UTI (lower urinary tract infection) Active Problems:   Multiple sclerosis    GERD (gastroesophageal reflux disease)   Hypothyroidism   Blindness of right eye   Diabetes mellitus without complication   1. UTI (lower urinary tract infection) The patient is presenting with generalized weakness, fever, leukocytosis, hypotension, tachycardia meeting criteria for sepsis secondary to UTI. Her chest x-ray is clear and she does not have any other symptoms that her symptoms are significantly improved with fluids and antibiotics.  at present she will be admitted to telemetry floor. IV fluids will be continued, IV antibiotics cefepime considering her history of Pseudomonas UTI will be continued. Urine and blood cultures will be followed.  2. multiple sclerosis At present does not have any significant neural muscular symptoms therefore doesn't appear to have any acute flare. Physical therapy consultation.  3. Hypothyroidism Continue Synthroid  4.Diabetes mellitus   on sliding scale, holding metformin   DVT Prophylaxis: subcutaneous Heparin Nutrition: Diabetes diet   Code Status: Full   Disposition: Admitted to inpatient in telemetry unit.  Author: Lynden Oxford, MD [facility] Pager: [phone] 05/09/2013, 1:46 AM    If 7PM-7AM, please contact night-coverage [email or web address] Password [email or web address]

## 2013-05-09 NOTE — Progress Notes (Signed)
Patient's O2 sats checked after removal of nasal cannula, at 87%. Per Rodney Booze, placed nasal cannula back on. Patient complained of nausea prior to 1000 meds. Patient's nausea is resolved and she is resting comfortably. MM

## 2013-05-09 NOTE — Progress Notes (Signed)
[facility] PROGRESS NOTE  [name] ZOX:[id number] DOB: 08-18-1950 DOA: 05/08/2013 PCP: Florentina Jenny, MD  Assessment/Plan: 1. Urinary tract infection. Patient with history of recurrent urinary tract infections, previous urine cultures growing pseudomonas aeruginosa. Will continue IV cefepime, for the next 24 hours, followup on urine cultures. Continue supportive care, IV fluids.  2. History of multiple sclerosis. Symptoms are stable, we'll consult occupational therapy and physical therapy. 3. Hypothyroidism. Continue thyroid replacement therapy 4. Type 2 diabetes mellitus. Metformin has been held, continue Accu-Cheks q. a.c. and each bedtime with sliding scale coverage  Code Status: Full code Family Communication: Plan discussed with patient Disposition Plan: Continue empiric IV antibiotic therapy for the next 24-48 hours, followup on urine cultures and blood cultures   Antibiotics:  Cefepime  HPI/Subjective: Patient is a pleasant 62 year old female with a past medical history of multiple sclerosis, history of recurrent urinary tract infections with previous urine cultures growing Pseudomonas, who was admitted overnight, presenting with overall generalized weakness as well as suprapubic pain. She was started on broad-spectrum empiric IV antimicrobial therapy with cefepime 1 g IV every 12 hours. UA showing evidence of urinary tract infection. This morning she states feeling better. Lab work was reviewed, with a downward trend to her white count from 19,100-12,000  Objective: Filed Vitals:   05/09/13 0530  BP: 121/50  Pulse: 99  Temp: 97.9 F (36.6 C)  Resp: 20    Intake/Output Summary (Last 24 hours) at 05/09/13 8119 Last data filed at 05/09/13 0135  Gross per 24 hour  Intake      0 ml  Output    202 ml  Net   -202 ml   Filed Weights   05/09/13 0100 05/09/13 0530  Weight: 101.7 kg (224 lb 3.3 oz) 101.107 kg (222 lb 14.4 oz)    Exam:   General:  Patient  states feeling better this morning, she is awake alert and oriented  Cardiovascular: Regular rate and rhythm normal S1-S2  Respiratory: Lungs are clear to auscultation bilaterally no wheezing rhonchi or rales  Abdomen: Soft nontender nondistended  Musculoskeletal: Deformity to the right hand. No cyanosis clubbing or edema   Data Reviewed: Basic Metabolic Panel:  Recent Labs Lab 05/08/13 2200 05/09/13 0514  NA 139 144  K 3.9 3.3*  CL 99 106  CO2 27 26  GLUCOSE 171* 131*  BUN 12 10  CREATININE 0.50 0.42*  CALCIUM 9.6 8.9   Liver Function Tests:  Recent Labs Lab 05/08/13 2200 05/09/13 0514  AST 29 23  ALT 28 23  ALKPHOS 133* 112  BILITOT 0.8 0.7  PROT 7.4 6.4  ALBUMIN 3.8 3.2*   No results found for this basename: LIPASE, AMYLASE,  in the last 168 hours No results found for this basename: AMMONIA,  in the last 168 hours CBC:  Recent Labs Lab 05/08/13 2200 05/09/13 0514  WBC 19.1* 12.0*  NEUTROABS 17.3*  --   HGB 15.0 13.3  HCT 45.7 40.4  MCV 94.2 93.7  PLT 267 299   Cardiac Enzymes: No results found for this basename: CKTOTAL, CKMB, CKMBINDEX, TROPONINI,  in the last 168 hours BNP (last 3 results) No results found for this basename: PROBNP,  in the last 8760 hours CBG:  Recent Labs Lab 05/09/13 0201 05/09/13 0540  GLUCAP 111* 111*    No results found for this or any previous visit (from the past 240 hour(s)).   Studies: Dg Chest 2 View  05/09/2013   CLINICAL DATA:  Generalized weakness; fever  and dysuria.  EXAM: CHEST  2 VIEW  COMPARISON:  Chest radiograph performed 07/02/2011  FINDINGS: The lungs are relatively well expanded. Mild vascular congestion is noted, without definite pulmonary edema. There is no evidence of pleural effusion or pneumothorax.  The heart is borderline enlarged. No acute osseous abnormalities are seen.  IMPRESSION: Mild vascular congestion and borderline cardiomegaly noted; lungs remain grossly clear.   Electronically Signed    By: Roanna Raider M.D.   On: 05/09/2013 00:01    Scheduled Meds: . antiseptic oral rinse  15 mL Mouth Rinse BID  . atorvastatin  20 mg Oral QHS  . baclofen  20 mg Oral QID  . ceFEPime (MAXIPIME) IV  1 g Intravenous Q12H  . darifenacin  7.5 mg Oral BID  . enoxaparin (LOVENOX) injection  40 mg Subcutaneous Q24H  . gabapentin  800 mg Oral QID  . [START ON 05/10/2013] influenza vac split quadrivalent PF  0.5 mL Intramuscular Tomorrow-1000  . insulin aspart  0-5 Units Subcutaneous QHS  . insulin aspart  0-9 Units Subcutaneous TID WC  . levothyroxine  88 mcg Oral QAC breakfast  . pantoprazole  40 mg Oral Daily  . sodium chloride  3 mL Intravenous Q12H   Continuous Infusions:   Principal Problem:   UTI (lower urinary tract infection) Active Problems:   Multiple sclerosis   GERD (gastroesophageal reflux disease)   Hypothyroidism   Blindness of right eye   Diabetes mellitus without complication    Time spent: 35 min    Jeralyn Bennett  [facility] Pager [phone]. If 7PM-7AM, please contact night-coverage at [email or web address], password [email or web address] 05/09/2013, 9:16 AM  LOS: 1 day

## 2013-05-10 DIAGNOSIS — R7881 Bacteremia: Secondary | ICD-10-CM

## 2013-05-10 DIAGNOSIS — A419 Sepsis, unspecified organism: Secondary | ICD-10-CM

## 2013-05-10 LAB — GLUCOSE, CAPILLARY
Glucose-Capillary: 105 mg/dL — ABNORMAL HIGH (ref 70–99)
Glucose-Capillary: 108 mg/dL — ABNORMAL HIGH (ref 70–99)
Glucose-Capillary: 104 mg/dL — ABNORMAL HIGH (ref 70–99)
Glucose-Capillary: 127 mg/dL — ABNORMAL HIGH (ref 70–99)

## 2013-05-10 LAB — BASIC METABOLIC PANEL
BUN: 10 mg/dL (ref 6–23)
CO2: 26 mEq/L (ref 19–32)
Chloride: 105 mEq/L (ref 96–112)
Creatinine, Ser: 0.52 mg/dL (ref 0.50–1.10)
GFR calc Af Amer: >90 mL/min (ref >90)
Potassium: 4 mEq/L (ref 3.5–5.1)

## 2013-05-10 LAB — CBC
HCT: 39.4 % (ref 36.0–46.0)
MCV: 95.2 fL (ref 78.0–100.0)
Platelets: 241 10*3/uL (ref 150–400)
RBC: 4.14 MIL/uL (ref 3.87–5.11)
WBC: 6.4 10*3/uL (ref 4.0–10.5)

## 2013-05-10 MED ORDER — DEXTROSE 5 % IV SOLN
1.0000 g | Freq: Three times a day (TID) | INTRAVENOUS | Status: DC
  Administered 2013-05-10 – 2013-05-11 (×3): 1 g via INTRAVENOUS
  Filled 2013-05-10 (×4): qty 1

## 2013-05-10 NOTE — Progress Notes (Addendum)
[facility] PROGRESS NOTE  [name] UJW:[id number] DOB: [birth year] DOA: 05/08/2013 PCP: Florentina Jenny, MD  Assessment/Plan: 1. Urinary tract infection. Patient with history of recurrent urinary tract infections, previous urine cultures growing pseudomonas aeruginosa. Will continue IV cefepime as urine cultures are still pending. She also had 2 positive blood cultures for gram-negative rods. Will continue IV antibiotics until blood cultures finalized. 2. Sepsis, present on admission, evidenced by a white count of 19,100, blood cultures growing gram-negative rods, with source of infection likely to be urinary tract. Will keep patient on IV antibiotic therapy until susceptibility testing of blood cultures available.  3. Positive blood cultures. Patient had one out of two positive blood cultures for gram-negative rods. Organism identification and susceptibility testing pending at the time of this dictation, meanwhile will continue IV cefepime for now 4. History of multiple sclerosis. Symptoms are stable, we'll consult occupational therapy and physical therapy. 5. Hypothyroidism. Continue thyroid replacement therapy 6. Type 2 diabetes mellitus. Metformin has been held, continue Accu-Cheks q. a.c. and each bedtime with sliding scale coverage  Code Status: Full code Family Communication: Plan discussed with patient Disposition Plan: Continue empiric IV antibiotic therapy for the next 24-48 hours, followup on urine cultures and blood cultures   Antibiotics:  Cefepime  HPI/Subjective: Patient states feeling much better today, she was awake alert and oriented during my encounter. She has no complaints, is tolerating by mouth intake. Blood cultures positive one out of 2 for gram-negative rods, remains on IV cefepime.  Objective: Filed Vitals:   05/10/13 0519  BP: 137/50  Pulse: 92  Temp: 98.4 F (36.9 C)  Resp: 18    Intake/Output Summary (Last 24 hours) at 05/10/13 1144 Last  data filed at 05/10/13 5621  Gross per 24 hour  Intake   1080 ml  Output      3 ml  Net   1077 ml   Filed Weights   05/09/13 0100 05/09/13 0530 05/10/13 0519  Weight: 101.7 kg (224 lb 3.3 oz) 101.107 kg (222 lb 14.4 oz) 101.969 kg (224 lb 12.8 oz)    Exam:   General:  Patient states feeling better this morning, she is awake alert and oriented  Cardiovascular: Regular rate and rhythm normal S1-S2  Respiratory: Lungs are clear to auscultation bilaterally no wheezing rhonchi or rales  Abdomen: Soft nontender nondistended  Musculoskeletal: Deformity to the right hand. No cyanosis clubbing or edema   Data Reviewed: Basic Metabolic Panel:  Recent Labs Lab 05/08/13 2200 05/09/13 0514 05/10/13 0515  NA 139 144 140  K 3.9 3.3* 4.0  CL 99 106 105  CO2 27 26 26   GLUCOSE 171* 131* 103*  BUN 12 10 10   CREATININE 0.50 0.42* 0.52  CALCIUM 9.6 8.9 8.8   Liver Function Tests:  Recent Labs Lab 05/08/13 2200 05/09/13 0514  AST 29 23  ALT 28 23  ALKPHOS 133* 112  BILITOT 0.8 0.7  PROT 7.4 6.4  ALBUMIN 3.8 3.2*   No results found for this basename: LIPASE, AMYLASE,  in the last 168 hours No results found for this basename: AMMONIA,  in the last 168 hours CBC:  Recent Labs Lab 05/08/13 2200 05/09/13 0514 05/10/13 0515  WBC 19.1* 12.0* 6.4  NEUTROABS 17.3*  --   --   HGB 15.0 13.3 12.7  HCT 45.7 40.4 39.4  MCV 94.2 93.7 95.2  PLT 267 299 241   Cardiac Enzymes: No results found for this basename: CKTOTAL, CKMB, CKMBINDEX, TROPONINI,  in the last  168 hours BNP (last 3 results) No results found for this basename: PROBNP,  in the last 8760 hours CBG:  Recent Labs Lab 05/09/13 0201 05/09/13 0540 05/09/13 1107 05/09/13 1643 05/09/13 2042  GLUCAP 111* 111* 154* 113* 108*    Recent Results (from the past 240 hour(s))  CULTURE, BLOOD (ROUTINE X 2)     Status: None   Collection Time    05/08/13  9:46 PM      Result Value Range Status   Specimen Description  BLOOD LEFT FOREARM   Final   Special Requests     Final   Value: BOTTLES DRAWN AEROBIC AND ANAEROBIC 5CC AER, 3CC ANA   Culture  Setup Time     Final   Value: 05/09/2013 04:49     Performed at [facility]   Culture     Final   Value: GRAM NEGATIVE RODS     Note: Gram Stain Report Called to,Read Back By and Verified With: Kandis Fantasia 05/09/13 1513 BY SMITHERSJ     Performed at [facility]   Report Status PENDING   Incomplete  CULTURE, BLOOD (ROUTINE X 2)     Status: None   Collection Time    05/08/13  9:57 PM      Result Value Range Status   Specimen Description BLOOD LEFT FOREARM   Final   Special Requests BOTTLES DRAWN AEROBIC AND ANAEROBIC EACH   Final   Culture  Setup Time     Final   Value: 05/09/2013 04:48     Performed at [facility]   Culture     Final   Value:        BLOOD CULTURE RECEIVED NO GROWTH TO DATE CULTURE WILL BE HELD FOR 5 DAYS BEFORE ISSUING A FINAL NEGATIVE REPORT     Performed at [facility]   Report Status PENDING   Incomplete     Studies: Dg Chest 2 View  05/09/2013   CLINICAL DATA:  Generalized weakness; fever and dysuria.  EXAM: CHEST  2 VIEW  COMPARISON:  Chest radiograph performed 07/02/2011  FINDINGS: The lungs are relatively well expanded. Mild vascular congestion is noted, without definite pulmonary edema. There is no evidence of pleural effusion or pneumothorax.  The heart is borderline enlarged. No acute osseous abnormalities are seen.  IMPRESSION: Mild vascular congestion and borderline cardiomegaly noted; lungs remain grossly clear.   Electronically Signed   By: Roanna Raider M.D.   On: 05/09/2013 00:01    Scheduled Meds: . antiseptic oral rinse  15 mL Mouth Rinse BID  . atorvastatin  20 mg Oral QHS  . baclofen  20 mg Oral QID  . ceFEPime (MAXIPIME) IV  1 g Intravenous Q12H  . darifenacin  7.5 mg Oral BID  . enoxaparin (LOVENOX) injection  40 mg Subcutaneous Q24H  . gabapentin  800 mg Oral QID  . insulin  aspart  0-5 Units Subcutaneous QHS  . insulin aspart  0-9 Units Subcutaneous TID WC  . levothyroxine  88 mcg Oral QAC breakfast  . pantoprazole  40 mg Oral Daily  . sodium chloride  3 mL Intravenous Q12H   Continuous Infusions:   Principal Problem:   UTI (lower urinary tract infection) Active Problems:   Multiple sclerosis   GERD (gastroesophageal reflux disease)   Hypothyroidism   Blindness of right eye   Diabetes mellitus without complication    Time spent: 35 min    Alletta Mattos  [facility] Pager  [phone]. If 7PM-7AM, please contact night-coverage at [email or web address], password [facility] 05/10/2013, 11:44 AM  LOS: 2 days

## 2013-05-10 NOTE — Progress Notes (Signed)
Pharmacy: Cefepime  62yof started on empiric cefepime for UTI. Blood cultures now positive 1/2 for GNR. Urine culture still pending. Will increase cefepime from 1g q12 to 1g q8 given positive blood culture. Renal function is stable.  11/7 Cefepime>> 11/7 blood x2>> 1/2 GNR 11/7 urine>> pending  Plan: 1) Cefepime 1g IV q8 2) Follow up culture results/sensitivities  Louie Casa, PharmD, BCPS 05/10/13, 1:42PM

## 2013-05-10 NOTE — Progress Notes (Signed)
PT Cancellation Note  Patient Details Name: [name] MRN: [id number] DOB: 1951/01/18   Cancelled Treatment:     Pt seen for evaluation. Reports she has total care (A) 24/7 at home. Pt has all DME needs at home; including electric wheelchair, hospital bed, hoyer lift. Pt able to state importance of turning and mobility to prevent pressure ulcers. No acute PT needs at this time. Will sign off.    Donnamarie Poag [place], [place] [phone] 05/10/2013, 10:36 AM

## 2013-05-11 DIAGNOSIS — R7881 Bacteremia: Secondary | ICD-10-CM | POA: Diagnosis present

## 2013-05-11 DIAGNOSIS — A419 Sepsis, unspecified organism: Secondary | ICD-10-CM

## 2013-05-11 LAB — GLUCOSE, CAPILLARY

## 2013-05-11 LAB — CULTURE, BLOOD (ROUTINE X 2)

## 2013-05-11 MED ORDER — CEFUROXIME AXETIL 500 MG PO TABS: 500.0000 mg | ORAL_TABLET | Freq: Two times a day (BID) | ORAL | Status: DC

## 2013-05-11 MED ORDER — BISACODYL 10 MG RE SUPP
10.0000 mg | Freq: Once | RECTAL | Status: AC
  Administered 2013-05-11: 10 mg via RECTAL
  Filled 2013-05-11: qty 1

## 2013-05-11 MED ORDER — LEVOTHYROXINE SODIUM 125 MCG PO TABS: 125.0000 ug | ORAL_TABLET | Freq: Every day | ORAL | Status: AC

## 2013-05-11 MED ORDER — CEFUROXIME AXETIL 500 MG PO TABS
500.0000 mg | ORAL_TABLET | Freq: Two times a day (BID) | ORAL | Status: DC
  Administered 2013-05-11: 17:00:00 500 mg via ORAL
  Filled 2013-05-11 (×3): qty 1

## 2013-05-11 MED ORDER — LEVOTHYROXINE SODIUM 88 MCG PO TABS: 88.0000 ug | ORAL_TABLET | Freq: Every day | ORAL | Status: DC

## 2013-05-11 MED ORDER — BISACODYL 5 MG PO TBEC: 5.0000 mg | DELAYED_RELEASE_TABLET | Freq: Every day | ORAL | Status: DC | PRN

## 2013-05-11 MED ORDER — ZOLPIDEM TARTRATE 5 MG PO TABS: 5.0000 mg | ORAL_TABLET | Freq: Every evening | ORAL | Status: DC | PRN

## 2013-05-11 MED ORDER — DOCUSATE SODIUM 100 MG PO CAPS: 100.0000 mg | ORAL_CAPSULE | Freq: Two times a day (BID) | ORAL | Status: DC

## 2013-05-11 NOTE — Discharge Summary (Addendum)
Physician Discharge Summary  [name] WUJ:[id number] DOB: 08-13-1950 DOA: 05/08/2013  PCP: Florentina Jenny, MD  Admit date: 05/08/2013 Discharge date: 05/11/2013  Time spent: 35 minutes  Recommendations for Outpatient Follow-up:  1. Please follow-up on urinary tract infection, would recommend repeating blood cultures in 1 week.  Discharge Diagnoses:  Principal Problem:   UTI (lower urinary tract infection) Active Problems:   Sepsis   Multiple sclerosis   GERD (gastroesophageal reflux disease)   Hypothyroidism   Blindness of right eye   Diabetes mellitus without complication   Positive blood cultures   Discharge Condition: Stable/improved  Diet recommendation: H:eart healthy diet  Filed Weights   05/09/13 0530 05/10/13 0519 05/11/13 0439  Weight: 101.107 kg (222 lb 14.4 oz) 101.969 kg (224 lb 12.8 oz) 100.426 kg (221 lb 6.4 oz)    History of present illness:  [name] is a 62 y.o. female with Past medical history of multiple sclerosis, neurogenic bladder with history of recurrent UTI, hypothyroidism, right eye blindness, bilateral leg swelling.  The patient is coming from home and transferred from med [facility].  She presented to med Center with the complaint of generalized weakness that has been ongoing since today. She mentions that she was so weak that she could not even stand up or lift her hand. She didn't feel febrile but has not measured her temperature. She was nauseated but no vomiting.  She did complain of mild suprapubic pain but denies any burning urination. She denies any burning urination he went with her prior UTIs.  There was no cough, chest pain, abdominal pain else where, diarrhea.  She wishes she is to live because of medications and there is no change in her medications recently.  Hospital Course:  Patient is a pleasant t72 year old female with a past medical history of multiple sclerosis, neurogenic bladder with a history of recurrent  urinary tract infections, who presented to the emergency room on 05/09/2013 with complaints of generalized weakness, suprapubic pain. Patient found to be septic, evidence by initial lab work showed a white count of 19,100, blood cultures growing gram-negative rods. Source of infection likely to be urinary tract as urinalysis showed many bacteria, positive for leukocyte esterase and nitrates. She was started on IV cefepime. Blood cultures grew Escherichia coli with susceptibility testing indicating that organism was pan susceptible. She was transitioned to Ceftin 500 mg by mouth twice a day on 05/11/2013. Patient showed significant clinical improvement as her white count trended down from 19,100 on the day of admission to 6400 on the day of discharge. She had remained afebrile for over 24 hours, tolerating by mouth intake, nontoxic, and felt that she was ready for discharge.   Procedures:  1 out of 2 blood cultures growing Escherichia coli, organism susceptible beta-lactams, fluoroquinolones   Discharge Exam: Filed Vitals:   05/11/13 0439  BP: 120/60  Pulse: 81  Temp: 97.8 F (36.6 C)  Resp: 18    General: Patient states feeling better this morning, she is awake alert and oriented  Cardiovascular: Regular rate and rhythm normal S1-S2  Respiratory: Lungs are clear to auscultation bilaterally no wheezing rhonchi or rales  Abdomen: Soft nontender nondistended  Musculoskeletal: Deformity to the right hand. No cyanosis clubbing or edema    Discharge Instructions      Discharge Orders   Future Orders Complete By Expires   Call MD for:  difficulty breathing, headache or visual disturbances  As directed    Call MD for:  extreme  fatigue  As directed    Call MD for:  hives  As directed    Call MD for:  persistant dizziness or light-headedness  As directed    Call MD for:  persistant nausea and vomiting  As directed    Call MD for:  severe uncontrolled pain  As directed    Call MD for:   temperature >100.4  As directed    Diet - low sodium heart healthy  As directed    Increase activity slowly  As directed        Medication List         aspirin EC 81 MG tablet  Take 81 mg by mouth daily.     atorvastatin 20 MG tablet  Commonly known as:  LIPITOR  Take 20 mg by mouth at bedtime.     baclofen 20 MG tablet  Commonly known as:  LIORESAL  Take 20 mg by mouth 4 (four) times daily.     bisacodyl 5 MG EC tablet  Commonly known as:  bisacodyl  Take 1 tablet (5 mg total) by mouth daily as needed for moderate constipation.     cefUROXime 500 MG tablet  Commonly known as:  CEFTIN  Take 1 tablet (500 mg total) by mouth 2 (two) times daily with a meal.     CRANBERRY PO  Take by mouth.     darifenacin 7.5 MG 24 hr tablet  Commonly known as:  ENABLEX  Take 7.5 mg by mouth 2 (two) times daily.     docusate sodium 100 MG capsule  Commonly known as:  COLACE  Take 1 capsule (100 mg total) by mouth 2 (two) times daily.     esomeprazole 40 MG capsule  Commonly known as:  NEXIUM  Take 40 mg by mouth daily at 12 noon.     furosemide 20 MG tablet  Commonly known as:  LASIX  Take 20 mg by mouth 2 (two) times daily.     gabapentin 400 MG capsule  Commonly known as:  NEURONTIN  Take 800 mg by mouth 2 (two) times daily.     levothyroxine 125 MCG tablet  Commonly known as:  SYNTHROID  Take 1 tablet (125 mcg total) by mouth daily before breakfast.     metFORMIN 500 MG tablet  Commonly known as:  GLUCOPHAGE  Take 250 mg by mouth 2 (two) times daily with a meal.     multivitamin with minerals Tabs tablet  Take 1 tablet by mouth daily.     QUEtiapine 25 MG tablet  Commonly known as:  SEROQUEL  Take 25 mg by mouth at bedtime.     zolpidem 5 MG tablet  Commonly known as:  AMBIEN  Take 1 tablet (5 mg total) by mouth at bedtime as needed for sleep (insomnia).       Allergies  Allergen Reactions  . Avelox [Moxifloxacin Hcl In Nacl] Hives and Other (See Comments)     Panic attack  . Ciprofloxacin Hcl Hives and Other (See Comments)    Panic attack  . Penicillins Hives  . Shrimp [Shellfish Allergy] Other (See Comments)    flushing   Follow-up Information   Follow up with Florentina Jenny, MD In 1 week.   Specialty:  Family Medicine   Contact information:   [address] [place] [address] [phone]        The results of significant diagnostics from this hospitalization (including imaging, microbiology, ancillary and laboratory) are listed  below for reference.    Significant Diagnostic Studies: Dg Chest 2 View  05/09/2013   CLINICAL DATA:  Generalized weakness; fever and dysuria.  EXAM: CHEST  2 VIEW  COMPARISON:  Chest radiograph performed 07/02/2011  FINDINGS: The lungs are relatively well expanded. Mild vascular congestion is noted, without definite pulmonary edema. There is no evidence of pleural effusion or pneumothorax.  The heart is borderline enlarged. No acute osseous abnormalities are seen.  IMPRESSION: Mild vascular congestion and borderline cardiomegaly noted; lungs remain grossly clear.   Electronically Signed   By: Roanna Raider M.D.   On: 05/09/2013 00:01    Microbiology: Recent Results (from the past 240 hour(s))  CULTURE, BLOOD (ROUTINE X 2)     Status: None   Collection Time    05/08/13  9:46 PM      Result Value Range Status   Specimen Description BLOOD LEFT FOREARM   Final   Special Requests     Final   Value: BOTTLES DRAWN AEROBIC AND ANAEROBIC 5CC AER, 3CC ANA   Culture  Setup Time     Final   Value: 05/09/2013 04:49     Performed at [facility]   Culture     Final   Value: ESCHERICHIA COLI     Note: Gram Stain Report Called to,Read Back By and Verified With: Kandis Fantasia 05/09/13 1513 BY SMITHERSJ     Performed at [facility]   Report Status 05/11/2013 FINAL   Final   Organism ID, Bacteria ESCHERICHIA COLI   Final  CULTURE, BLOOD (ROUTINE X 2)     Status: None   Collection Time     05/08/13  9:57 PM      Result Value Range Status   Specimen Description BLOOD LEFT FOREARM   Final   Special Requests BOTTLES DRAWN AEROBIC AND ANAEROBIC EACH   Final   Culture  Setup Time     Final   Value: 05/09/2013 04:48     Performed at [facility]   Culture     Final   Value:        BLOOD CULTURE RECEIVED NO GROWTH TO DATE CULTURE WILL BE HELD FOR 5 DAYS BEFORE ISSUING A FINAL NEGATIVE REPORT     Performed at [facility]   Report Status PENDING   Incomplete  URINE CULTURE     Status: None   Collection Time    05/08/13 10:35 PM      Result Value Range Status   Specimen Description URINE, CATHETERIZED   Final   Special Requests A   Final   Culture  Setup Time     Final   Value: 05/09/2013 06:00     Performed at [facility] Count     Final   Value: >=100,000 COLONIES/ML     Performed at [facility]   Culture     Final   Value: GRAM NEGATIVE RODS     Performed at [facility]   Report Status PENDING   Incomplete     Labs: Basic Metabolic Panel:  Recent Labs Lab 05/08/13 2200 05/09/13 0514 05/10/13 0515  NA 139 144 140  K 3.9 3.3* 4.0  CL 99 106 105  CO2 27 26 26   GLUCOSE 171* 131* 103*  BUN 12 10 10   CREATININE 0.50 0.42* 0.52  CALCIUM 9.6 8.9 8.8   Liver Function Tests:  Recent Labs Lab 05/08/13 2200 05/09/13 0514  AST  29 23  ALT 28 23  ALKPHOS 133* 112  BILITOT 0.8 0.7  PROT 7.4 6.4  ALBUMIN 3.8 3.2*   No results found for this basename: LIPASE, AMYLASE,  in the last 168 hours No results found for this basename: AMMONIA,  in the last 168 hours CBC:  Recent Labs Lab 05/08/13 2200 05/09/13 0514 05/10/13 0515  WBC 19.1* 12.0* 6.4  NEUTROABS 17.3*  --   --   HGB 15.0 13.3 12.7  HCT 45.7 40.4 39.4  MCV 94.2 93.7 95.2  PLT 267 299 241   Cardiac Enzymes: No results found for this basename: CKTOTAL, CKMB, CKMBINDEX, TROPONINI,  in the last 168 hours BNP: BNP (last 3 results) No results  found for this basename: PROBNP,  in the last 8760 hours CBG:  Recent Labs Lab 05/10/13 1110 05/10/13 1556 05/10/13 2117 05/11/13 0636 05/11/13 1107  GLUCAP 108* 104* 127* 104* 129*       Signed:  Amala Petion  [facility] 05/11/2013, 12:56 PM

## 2013-05-11 NOTE — Progress Notes (Signed)
OT Cancellation Note  Patient Details Name: [name] MRN: [id number] DOB: 23-Jul-1950   Cancelled Treatment:    Reason Eval/Treat Not Completed: OT screened, no needs identified, will sign off. Reports she has total care (A) 24/7 at home. Pt has all DME needs at home; including electric wheelchair, hospital bed, hoyer lift. Pt understands benefits/importance of  mobility to prevent pressure ulcers. No acute OT services indictaed at this time. Will sign off.      Margaretmary Eddy [facility] 05/11/2013, 10:43 AM

## 2013-05-11 NOTE — Progress Notes (Signed)
Pt discharged per ambulance to private home. Has MS diagnosis and says she is bedridden. Caretaker will be there to let them in. Pt has all personal belongings.

## 2013-05-11 NOTE — Progress Notes (Signed)
[facility]  Patient Status: Active (receiving services up to time of hospitalization)  [facility] is providing the following services: RN  If patient discharges after hours, please call [phone].   Wynelle Bourgeois 05/11/2013, 11:23 AM

## 2013-05-11 NOTE — Progress Notes (Signed)
Went over all discharge instructions with pt including follow up appts and home meds.

## 2013-05-12 LAB — URINE CULTURE: Colony Count: >=100000

## 2013-05-15 LAB — CULTURE, BLOOD (ROUTINE X 2): Culture: NO GROWTH

## 2013-07-23 ENCOUNTER — Inpatient Hospital Stay (HOSPITAL_COMMUNITY)
Admission: EM | Admit: 2013-07-23 | Discharge: 2013-07-25 | DRG: 689 | Disposition: A | Discharge status: Home Health | Payer: Medicare Other | Attending: Internal Medicine | Admitting: Internal Medicine

## 2013-07-23 ENCOUNTER — Encounter (HOSPITAL_COMMUNITY): Payer: Self-pay | Admitting: Emergency Medicine

## 2013-07-23 ENCOUNTER — Emergency Department (HOSPITAL_COMMUNITY): Payer: Medicare Other

## 2013-07-23 DIAGNOSIS — G35 Multiple sclerosis: Secondary | ICD-10-CM | POA: Diagnosis present

## 2013-07-23 DIAGNOSIS — Z79899 Other long term (current) drug therapy: Secondary | ICD-10-CM

## 2013-07-23 DIAGNOSIS — H5440 Blindness, one eye, unspecified eye: Secondary | ICD-10-CM | POA: Diagnosis present

## 2013-07-23 DIAGNOSIS — M129 Arthropathy, unspecified: Secondary | ICD-10-CM | POA: Diagnosis present

## 2013-07-23 DIAGNOSIS — E119 Type 2 diabetes mellitus without complications: Secondary | ICD-10-CM | POA: Diagnosis present

## 2013-07-23 DIAGNOSIS — N319 Neuromuscular dysfunction of bladder, unspecified: Secondary | ICD-10-CM | POA: Diagnosis present

## 2013-07-23 DIAGNOSIS — K219 Gastro-esophageal reflux disease without esophagitis: Secondary | ICD-10-CM | POA: Diagnosis present

## 2013-07-23 DIAGNOSIS — H5316 Psychophysical visual disturbances: Secondary | ICD-10-CM | POA: Diagnosis present

## 2013-07-23 DIAGNOSIS — Z7401 Bed confinement status: Secondary | ICD-10-CM

## 2013-07-23 DIAGNOSIS — Z7982 Long term (current) use of aspirin: Secondary | ICD-10-CM

## 2013-07-23 DIAGNOSIS — F411 Generalized anxiety disorder: Secondary | ICD-10-CM | POA: Diagnosis present

## 2013-07-23 DIAGNOSIS — R404 Transient alteration of awareness: Secondary | ICD-10-CM

## 2013-07-23 DIAGNOSIS — L89109 Pressure ulcer of unspecified part of back, unspecified stage: Secondary | ICD-10-CM | POA: Diagnosis present

## 2013-07-23 DIAGNOSIS — N390 Urinary tract infection, site not specified: Principal | ICD-10-CM | POA: Diagnosis present

## 2013-07-23 DIAGNOSIS — L8992 Pressure ulcer of unspecified site, stage 2: Secondary | ICD-10-CM | POA: Diagnosis present

## 2013-07-23 DIAGNOSIS — E039 Hypothyroidism, unspecified: Secondary | ICD-10-CM | POA: Diagnosis present

## 2013-07-23 DIAGNOSIS — R410 Disorientation, unspecified: Secondary | ICD-10-CM

## 2013-07-23 DIAGNOSIS — Z87440 Personal history of urinary (tract) infections: Secondary | ICD-10-CM

## 2013-07-23 DIAGNOSIS — G9341 Metabolic encephalopathy: Secondary | ICD-10-CM | POA: Diagnosis present

## 2013-07-23 LAB — COMPREHENSIVE METABOLIC PANEL
ALT: 8 U/L (ref 0–35)
AST: 17 U/L (ref 0–37)
Albumin: 3.7 g/dL (ref 3.5–5.2)
Alkaline Phosphatase: 149 U/L — ABNORMAL HIGH (ref 39–117)
BUN: 10 mg/dL (ref 6–23)
CALCIUM: 8.9 mg/dL (ref 8.4–10.5)
CO2: 26 meq/L (ref 19–32)
CREATININE: 0.55 mg/dL (ref 0.50–1.10)
Chloride: 96 mEq/L (ref 96–112)
GFR calc non Af Amer: >90 mL/min (ref >90)
GLUCOSE: 102 mg/dL — AB (ref 70–99)
Potassium: 2.8 mEq/L — CL (ref 3.7–5.3)
Sodium: 139 mEq/L (ref 137–147)
Total Bilirubin: 0.6 mg/dL (ref 0.3–1.2)
Total Protein: 7.5 g/dL (ref 6.0–8.3)

## 2013-07-23 LAB — URINALYSIS, ROUTINE W REFLEX MICROSCOPIC
BILIRUBIN URINE: NEGATIVE
Glucose, UA: NEGATIVE mg/dL
Ketones, ur: NEGATIVE mg/dL
NITRITE: POSITIVE — AB
Protein, ur: NEGATIVE mg/dL
Specific Gravity, Urine: 1.011 (ref 1.005–1.030)
UROBILINOGEN UA: 0.2 mg/dL (ref 0.0–1.0)
pH: 7 (ref 5.0–8.0)

## 2013-07-23 LAB — CBC WITH DIFFERENTIAL/PLATELET
BASOS ABS: 0.1 10*3/uL (ref 0.0–0.1)
Basophils Relative: 1 % (ref 0–1)
EOS ABS: 0.3 10*3/uL (ref 0.0–0.7)
EOS PCT: 2 % (ref 0–5)
HCT: 44.3 % (ref 36.0–46.0)
Hemoglobin: 14.9 g/dL (ref 12.0–15.0)
Lymphocytes Relative: 14 % (ref 12–46)
Lymphs Abs: 1.8 10*3/uL (ref 0.7–4.0)
MCH: 30.6 pg (ref 26.0–34.0)
MCHC: 33.6 g/dL (ref 30.0–36.0)
MCV: 91 fL (ref 78.0–100.0)
Monocytes Absolute: 0.8 10*3/uL (ref 0.1–1.0)
Monocytes Relative: 6 % (ref 3–12)
Neutro Abs: 10 10*3/uL — ABNORMAL HIGH (ref 1.7–7.7)
Neutrophils Relative %: 77 % (ref 43–77)
PLATELETS: 369 10*3/uL (ref 150–400)
RBC: 4.87 MIL/uL (ref 3.87–5.11)
RDW: 14.4 % (ref 11.5–15.5)
WBC: 12.9 10*3/uL — ABNORMAL HIGH (ref 4.0–10.5)

## 2013-07-23 LAB — URINE MICROSCOPIC-ADD ON

## 2013-07-23 LAB — SALICYLATE LEVEL: Salicylate Lvl: <2 mg/dL — ABNORMAL LOW (ref 2.8–20.0)

## 2013-07-23 LAB — ACETAMINOPHEN LEVEL

## 2013-07-23 LAB — ETHANOL: Alcohol, Ethyl (B): <11 mg/dL (ref 0–11)

## 2013-07-23 MED ORDER — CEFTRIAXONE SODIUM 1 G IJ SOLR
1.0000 g | Freq: Once | INTRAMUSCULAR | Status: AC
  Administered 2013-07-23: 1 g via INTRAVENOUS
  Filled 2013-07-23: qty 10

## 2013-07-23 MED ORDER — POTASSIUM CHLORIDE CRYS ER 20 MEQ PO TBCR
40.0000 meq | EXTENDED_RELEASE_TABLET | Freq: Once | ORAL | Status: AC
  Administered 2013-07-23: 40 meq via ORAL
  Filled 2013-07-23: qty 2

## 2013-07-23 MED ORDER — POTASSIUM CHLORIDE 10 MEQ/100ML IV SOLN
10.0000 meq | Freq: Once | INTRAVENOUS | Status: AC
  Administered 2013-07-23: 10 meq via INTRAVENOUS
  Filled 2013-07-23: qty 100
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
## 2013-07-23 NOTE — ED Notes (Signed)
CRITICAL VALUE ALERT  Critical value received:  Potassium 2.8 mEq/L Date of notification:  07/23/13 Time of notification:  1843 Critical value read back:yes Nurse who received alert: Kai LevinsJeremy Hanne Kegg, RN Primary Nurse notified: Johnny BridgeMartha (face-to-face) EDPA notified: EDPA Rob (face-to-face) Time of first page:  [phone]

## 2013-07-23 NOTE — BH Assessment (Signed)
[facility] Assessment Progress Note  At 19:35 I spoke to EDP Roxy Horseman, PA-C face-to-face in anticipation of TTS assessment.  Doylene Canning, MA Triage Specialist 07/23/2013 @ 20:42

## 2013-07-23 NOTE — ED Notes (Signed)
Per EMS: delusions x 2 days. 2 new meds Seroquel and zanaflex on 1/15. Family thinks it may be from these. Pt has MS, nonambulatory.No SI/HI. Pt just sees people no one else can see. CBG 116.

## 2013-07-23 NOTE — ED Provider Notes (Signed)
CSN: [id number]     Arrival date & time 07/23/13  1701 History   First MD Initiated Contact with Patient 07/23/13 1708     Chief Complaint  Patient presents with  . Delusional   (Consider location/radiation/quality/duration/timing/severity/associated sxs/prior Treatment) HPI Comments: Patient presents emergency department with chief complaint of hallucinations. She states that she sees people but no one else can see. She states that she recently had a change in her medications. Patient denies any pain or other symptoms.  History is difficult to obtain 2/2 pysch disorder.  The history is provided by the patient. No language interpreter was used.    Past Medical History  Diagnosis Date  . Urinary incontinence     neurogenic bladder--hx of uti's  . Thyroid disease   . Multiple sclerosis   . GERD (gastroesophageal reflux disease)   . Hypothyroidism   . Anxiety   . Blindness of right eye     complication of ms  . Arthritis     old falls-with injuries to rt knee and both ankles and rt wrist--states she has some pain in these areas-probable arthritis  . Cellulitis     hx of cellulitis in lower extremities -requiring multiple hospitalzations in the past--no problems in las 2 yrs  . Neuromuscular disorder     ms-not ambulatory-muscle spasms-and recent tremors left arm and left  leg have developed--dr. Daphane ShepherdMeyer in [place] is pt's neurologist  . Diabetes mellitus without complication    Past Surgical History  Procedure Laterality Date  . Cholecystectomy    . Tonsillectomy    . Ankle surgery    . Wrist surgery    . Lymph gland excision    . Mediastinoscopy  04/24/10    for enlarged right paratracheal lymph node--surgery at [facility]-Dr. Burney--pt states she does not know the  what the pathology report showed.  . Adenoidectomy    . Fracture surgery  2005    orif left ankle fx  . Cystoscopy  07/02/2011    Procedure: CYSTOSCOPY;  Surgeon: Kathi LudwigSigmund I Tannenbaum, MD;  Location: [facility];   Service: Urology;  Laterality: N/A;   No family history on file. History  Substance Use Topics  . Smoking status: Never Smoker   . Smokeless tobacco: Never Used  . Alcohol Use: No   OB History   Grav Para Term Preterm Abortions TAB SAB Ect Mult Living                 Review of Systems  Unable to perform ROS: Psychiatric disorder    Allergies  Avelox; Ciprofloxacin hcl; Penicillins; and Shrimp  Home Medications   Current Outpatient Rx  Name  Route  Sig  Dispense  Refill  . aspirin EC 81 MG tablet   Oral   Take 81 mg by mouth daily.         Marland Kitchen. atorvastatin (LIPITOR) 20 MG tablet   Oral   Take 20 mg by mouth at bedtime.         . baclofen (LIORESAL) 20 MG tablet   Oral   Take 20 mg by mouth 4 (four) times daily.         . bisacodyl (BISACODYL) 5 MG EC tablet   Oral   Take 1 tablet (5 mg total) by mouth daily as needed for moderate constipation.   30 tablet   0   . cefUROXime (CEFTIN) 500 MG tablet   Oral   Take 1 tablet (500 mg total) by mouth 2 (two)  times daily with a meal.   16 tablet   0   . CRANBERRY PO   Oral   Take by mouth.         . darifenacin (ENABLEX) 7.5 MG 24 hr tablet   Oral   Take 7.5 mg by mouth 2 (two) times daily.         Marland Kitchen docusate sodium (COLACE) 100 MG capsule   Oral   Take 1 capsule (100 mg total) by mouth 2 (two) times daily.   20 capsule   0   . esomeprazole (NEXIUM) 40 MG capsule   Oral   Take 40 mg by mouth daily at 12 noon.         . furosemide (LASIX) 20 MG tablet   Oral   Take 20 mg by mouth 2 (two) times daily.         Marland Kitchen gabapentin (NEURONTIN) 400 MG capsule   Oral   Take 800 mg by mouth 2 (two) times daily.         Marland Kitchen levothyroxine (SYNTHROID) 125 MCG tablet   Oral   Take 1 tablet (125 mcg total) by mouth daily before breakfast.   30 tablet   0   . metFORMIN (GLUCOPHAGE) 500 MG tablet   Oral   Take 250 mg by mouth 2 (two) times daily with a meal.         . Multiple Vitamin (MULTIVITAMIN  WITH MINERALS) TABS tablet   Oral   Take 1 tablet by mouth daily.         . QUEtiapine (SEROQUEL) 25 MG tablet   Oral   Take 25 mg by mouth at bedtime.         Marland Kitchen zolpidem (AMBIEN) 5 MG tablet   Oral   Take 1 tablet (5 mg total) by mouth at bedtime as needed for sleep (insomnia).   20 tablet   0    BP 102/68  Pulse 81  Temp(Src) 98.2 F (36.8 C) (Oral)  Resp 14  SpO2 91% Physical Exam  Nursing note and vitals reviewed. Constitutional: She is oriented to person, place, and time. She appears well-developed and well-nourished.  HENT:  Head: Normocephalic and atraumatic.  Eyes: Conjunctivae and EOM are normal. Pupils are equal, round, and reactive to light.  Neck: Normal range of motion. Neck supple.  Cardiovascular: Normal rate and regular rhythm.  Exam reveals no gallop and no friction rub.   No murmur heard. Pulmonary/Chest: Effort normal and breath sounds normal. No respiratory distress. She has no wheezes. She has no rales. She exhibits no tenderness.  Abdominal: Soft. Bowel sounds are normal. She exhibits no distension and no mass. There is no tenderness. There is no rebound and no guarding.  Musculoskeletal: Normal range of motion. She exhibits no edema and no tenderness.  Neurological: She is alert and oriented to person, place, and time.  Skin: Skin is warm and dry.  Psychiatric:  delusional    ED Course  Procedures (including critical care time) Results for orders placed during the hospital encounter of 07/23/13  CBC WITH DIFFERENTIAL      Result Value Range   WBC 12.9 (*) 4.0 - 10.5 K/uL   RBC 4.87  3.87 - 5.11 MIL/uL   Hemoglobin 14.9  12.0 - 15.0 g/dL   HCT 37.3  42.8 - 76.8 %   MCV 91.0  78.0 - 100.0 fL   MCH 30.6  26.0 - 34.0 pg   MCHC 33.6  30.0 -  36.0 g/dL   RDW 16.1  09.6 - 04.5 %   Platelets 369  150 - 400 K/uL   Neutrophils Relative % 77  43 - 77 %   Neutro Abs 10.0 (*) 1.7 - 7.7 K/uL   Lymphocytes Relative 14  12 - 46 %   Lymphs Abs 1.8  0.7  - 4.0 K/uL   Monocytes Relative 6  3 - 12 %   Monocytes Absolute 0.8  0.1 - 1.0 K/uL   Eosinophils Relative 2  0 - 5 %   Eosinophils Absolute 0.3  0.0 - 0.7 K/uL   Basophils Relative 1  0 - 1 %   Basophils Absolute 0.1  0.0 - 0.1 K/uL  COMPREHENSIVE METABOLIC PANEL      Result Value Range   Sodium 139  137 - 147 mEq/L   Potassium 2.8 (*) 3.7 - 5.3 mEq/L   Chloride 96  96 - 112 mEq/L   CO2 26  19 - 32 mEq/L   Glucose, Bld 102 (*) 70 - 99 mg/dL   BUN 10  6 - 23 mg/dL   Creatinine, Ser 4.09  0.50 - 1.10 mg/dL   Calcium 8.9  8.4 - 81.1 mg/dL   Total Protein 7.5  6.0 - 8.3 g/dL   Albumin 3.7  3.5 - 5.2 g/dL   AST 17  0 - 37 U/L   ALT 8  0 - 35 U/L   Alkaline Phosphatase 149 (*) 39 - 117 U/L   Total Bilirubin 0.6  0.3 - 1.2 mg/dL   GFR calc non Af Amer >90  >90 mL/min   GFR calc Af Amer >90  >90 mL/min  ETHANOL      Result Value Range   Alcohol, Ethyl (B) <11  0 - 11 mg/dL  ACETAMINOPHEN LEVEL      Result Value Range   Acetaminophen (Tylenol), Serum <15.0  10 - 30 ug/mL  SALICYLATE LEVEL      Result Value Range   Salicylate Lvl <2.0 (*) 2.8 - 20.0 mg/dL  URINALYSIS, ROUTINE W REFLEX MICROSCOPIC      Result Value Range   Color, Urine YELLOW  YELLOW   APPearance TURBID (*) CLEAR   Specific Gravity, Urine 1.011  1.005 - 1.030   pH 7.0  5.0 - 8.0   Glucose, UA NEGATIVE  NEGATIVE mg/dL   Hgb urine dipstick SMALL (*) NEGATIVE   Bilirubin Urine NEGATIVE  NEGATIVE   Ketones, ur NEGATIVE  NEGATIVE mg/dL   Protein, ur NEGATIVE  NEGATIVE mg/dL   Urobilinogen, UA 0.2  0.0 - 1.0 mg/dL   Nitrite POSITIVE (*) NEGATIVE   Leukocytes, UA LARGE (*) NEGATIVE  URINE MICROSCOPIC-ADD ON      Result Value Range   Squamous Epithelial / LPF FEW (*) RARE   WBC, UA TOO NUMEROUS TO COUNT  <3 WBC/hpf   RBC / HPF FIELD OBSCURED BY WBC'S  <3 RBC/hpf   Bacteria, UA MANY (*) RARE   Ct Head Wo Contrast  07/23/2013   CLINICAL DATA:  Delusions  EXAM: CT HEAD WITHOUT CONTRAST  TECHNIQUE: Contiguous axial  images were obtained from the base of the skull through the vertex without intravenous contrast.  COMPARISON:  March 29, 2010  FINDINGS: There is chronic diffuse atrophy. Chronic bilateral periventricular white matter small vessel ischemic changes identified. There is no midline shift, hydrocephalus, or mass. No acute hemorrhage or acute transcortical infarct is identified. The bony calvarium is intact. There is mucoperiosteal thickening of the left  sphenoid sinus.  IMPRESSION: No focal acute intracranial abnormality identified. Atrophy. Chronic small vessel ischemic change.   Electronically Signed   By: Sherian Rein M.D.   On: 07/23/2013 21:25     EKG Interpretation    Date/Time:  Thursday July 23 2013 20:04:11 EST Ventricular Rate:  89 PR Interval:  165 QRS Duration: 90 QT Interval:  370 QTC Calculation: 450 R Axis:   162 Text Interpretation:  Sinus rhythm Anterior infarct, age indeterminate poor baseline No significant change since last tracing Confirmed by YAO  MD, DAVID [phone]) on 07/23/2013 8:16:57 PM            MDM   1. UTI (lower urinary tract infection)    Patient with MS, and visual hallucinations. Patient seen by psych, who recommends medical treatment and no psych management.  Discussed with Dr. Silverio Lay, who recommends admission.    Will treat UTI.  Discussed with [facility], who will admit.    Roxy Horseman, PA-C 07/24/13 [phone]

## 2013-07-23 NOTE — ED Notes (Signed)
[facility] Expected date:  Expected time:  Means of arrival:  Comments: EMS- delusions, new med change

## 2013-07-24 DIAGNOSIS — N390 Urinary tract infection, site not specified: Principal | ICD-10-CM

## 2013-07-24 LAB — COMPREHENSIVE METABOLIC PANEL
ALBUMIN: 3.9 g/dL (ref 3.5–5.2)
ALT: 9 U/L (ref 0–35)
AST: 24 U/L (ref 0–37)
Alkaline Phosphatase: 153 U/L — ABNORMAL HIGH (ref 39–117)
BUN: 9 mg/dL (ref 6–23)
CALCIUM: 9.4 mg/dL (ref 8.4–10.5)
CO2: 24 mEq/L (ref 19–32)
CREATININE: 0.58 mg/dL (ref 0.50–1.10)
Chloride: 97 mEq/L (ref 96–112)
GFR calc Af Amer: >90 mL/min (ref >90)
GFR calc non Af Amer: >90 mL/min (ref >90)
Glucose, Bld: 80 mg/dL (ref 70–99)
Potassium: 3.4 mEq/L — ABNORMAL LOW (ref 3.7–5.3)
Sodium: 142 mEq/L (ref 137–147)
TOTAL PROTEIN: 8.1 g/dL (ref 6.0–8.3)
Total Bilirubin: 0.7 mg/dL (ref 0.3–1.2)

## 2013-07-24 LAB — GLUCOSE, CAPILLARY
GLUCOSE-CAPILLARY: 96 mg/dL (ref 70–99)
Glucose-Capillary: 100 mg/dL — ABNORMAL HIGH (ref 70–99)
Glucose-Capillary: 101 mg/dL — ABNORMAL HIGH (ref 70–99)
Glucose-Capillary: 124 mg/dL — ABNORMAL HIGH (ref 70–99)

## 2013-07-24 LAB — PROTIME-INR
INR: 0.97 (ref 0.00–1.49)
PROTHROMBIN TIME: 12.7 s (ref 11.6–15.2)

## 2013-07-24 LAB — CBC
HCT: 45.7 % (ref 36.0–46.0)
HEMOGLOBIN: 15.6 g/dL — AB (ref 12.0–15.0)
MCH: 31.5 pg (ref 26.0–34.0)
MCHC: 34.1 g/dL (ref 30.0–36.0)
MCV: 92.3 fL (ref 78.0–100.0)
Platelets: 426 10*3/uL — ABNORMAL HIGH (ref 150–400)
RBC: 4.95 MIL/uL (ref 3.87–5.11)
RDW: 14.7 % (ref 11.5–15.5)
WBC: 11.1 10*3/uL — AB (ref 4.0–10.5)

## 2013-07-24 MED ORDER — FUROSEMIDE 20 MG PO TABS
20.0000 mg | ORAL_TABLET | Freq: Two times a day (BID) | ORAL | Status: DC
  Administered 2013-07-24 – 2013-07-25 (×2): 20 mg via ORAL
  Filled 2013-07-24 (×8): qty 1

## 2013-07-24 MED ORDER — GABAPENTIN 400 MG PO CAPS
800.0000 mg | ORAL_CAPSULE | Freq: Two times a day (BID) | ORAL | Status: DC
  Administered 2013-07-24 – 2013-07-25 (×3): 800 mg via ORAL
  Filled 2013-07-24 (×6): qty 2

## 2013-07-24 MED ORDER — ACETAMINOPHEN 500 MG PO TABS
1000.0000 mg | ORAL_TABLET | Freq: Every evening | ORAL | Status: DC | PRN
  Administered 2013-07-24 – 2013-07-25 (×2): 1000 mg via ORAL
  Filled 2013-07-24 (×2): qty 2

## 2013-07-24 MED ORDER — PANTOPRAZOLE SODIUM 40 MG PO TBEC
40.0000 mg | DELAYED_RELEASE_TABLET | Freq: Every day | ORAL | Status: DC
Start: 2013-07-24 — End: 2013-07-25
  Administered 2013-07-24 – 2013-07-25 (×2): 40 mg via ORAL
  Filled 2013-07-24 (×2): qty 1

## 2013-07-24 MED ORDER — SIMETHICONE 80 MG PO CHEW
160.0000 mg | CHEWABLE_TABLET | Freq: Four times a day (QID) | ORAL | Status: DC | PRN
  Filled 2013-07-24: qty 2

## 2013-07-24 MED ORDER — LEVOTHYROXINE SODIUM 125 MCG PO TABS
125.0000 ug | ORAL_TABLET | Freq: Every day | ORAL | Status: DC
  Administered 2013-07-24 – 2013-07-25 (×2): 125 ug via ORAL
  Filled 2013-07-24 (×5): qty 1

## 2013-07-24 MED ORDER — INSULIN ASPART 100 UNIT/ML ~~LOC~~ SOLN: 0.0000 [IU] | Freq: Three times a day (TID) | SUBCUTANEOUS | Status: DC

## 2013-07-24 MED ORDER — ONDANSETRON HCL 4 MG/2ML IJ SOLN: 4.0000 mg | Freq: Four times a day (QID) | INTRAMUSCULAR | Status: DC | PRN

## 2013-07-24 MED ORDER — INSULIN ASPART 100 UNIT/ML ~~LOC~~ SOLN: 0.0000 [IU] | Freq: Every day | SUBCUTANEOUS | Status: DC

## 2013-07-24 MED ORDER — ONDANSETRON HCL 4 MG PO TABS: 4.0000 mg | ORAL_TABLET | Freq: Four times a day (QID) | ORAL | Status: DC | PRN

## 2013-07-24 MED ORDER — GLUCERNA SHAKE PO LIQD
237.0000 mL | Freq: Every day | ORAL | Status: DC
  Administered 2013-07-24: 237 mL via ORAL

## 2013-07-24 MED ORDER — ENOXAPARIN SODIUM 40 MG/0.4ML ~~LOC~~ SOLN
40.0000 mg | SUBCUTANEOUS | Status: DC
  Administered 2013-07-24 – 2013-07-25 (×2): 40 mg via SUBCUTANEOUS
  Filled 2013-07-24 (×3): qty 0.4

## 2013-07-24 MED ORDER — DEXTROSE 5 % IV SOLN
1.0000 g | INTRAVENOUS | Status: DC
  Administered 2013-07-24: 1 g via INTRAVENOUS
  Filled 2013-07-24 (×2): qty 10

## 2013-07-24 MED ORDER — ATORVASTATIN CALCIUM 20 MG PO TABS
20.0000 mg | ORAL_TABLET | Freq: Every day | ORAL | Status: DC
  Administered 2013-07-24 (×2): 20 mg via ORAL
  Filled 2013-07-24 (×4): qty 1

## 2013-07-24 MED ORDER — DARIFENACIN HYDROBROMIDE ER 7.5 MG PO TB24
7.5000 mg | ORAL_TABLET | Freq: Two times a day (BID) | ORAL | Status: DC
  Administered 2013-07-24 – 2013-07-25 (×4): 7.5 mg via ORAL
  Filled 2013-07-24 (×7): qty 1

## 2013-07-24 MED ORDER — DOCUSATE SODIUM 100 MG PO CAPS
100.0000 mg | ORAL_CAPSULE | Freq: Two times a day (BID) | ORAL | Status: DC
  Administered 2013-07-24 – 2013-07-25 (×4): 100 mg via ORAL
  Filled 2013-07-24 (×4): qty 1

## 2013-07-24 MED ORDER — ASPIRIN EC 81 MG PO TBEC
81.0000 mg | DELAYED_RELEASE_TABLET | Freq: Every day | ORAL | Status: DC
  Administered 2013-07-24 – 2013-07-25 (×2): 81 mg via ORAL
  Filled 2013-07-24 (×3): qty 1

## 2013-07-24 MED ORDER — SODIUM CHLORIDE 0.9 % IJ SOLN
3.0000 mL | Freq: Two times a day (BID) | INTRAMUSCULAR | Status: DC
  Administered 2013-07-24 (×3): 3 mL via INTRAVENOUS

## 2013-07-24 MED ORDER — TRIMETHOPRIM 100 MG PO TABS
100.0000 mg | ORAL_TABLET | Freq: Every day | ORAL | Status: DC
  Administered 2013-07-24 – 2013-07-25 (×2): 100 mg via ORAL
  Filled 2013-07-24 (×3): qty 1

## 2013-07-24 NOTE — Progress Notes (Signed)
[facility] due to [facility] beds. Chart reviewed. She knows that [facility] is [facility] and states that she is feeling much better. She is currently awake, alert and oriented. Her questions were answered. Nursing staff notified me of some stage 2 ulcerations on her buttocks. Pt is incontinent, will place foley, and obtain UDS specimen, then leave foley until wounds can be further addressed.

## 2013-07-24 NOTE — ED Notes (Signed)
Pt out of ED to [facility].

## 2013-07-24 NOTE — Care Management Note (Unsigned)
    Page 1 of 1   07/24/2013     4:24:16 PM   CARE MANAGEMENT NOTE 07/24/2013  Patient:  [name],[name]   Account Number:  [id number]  Date Initiated:  07/24/2013  Documentation initiated by:  Chistopher Mangino  Subjective/Objective Assessment:   PT ADM ON 1/22 WITH UTI, DELIRIUM.  PMH OF MULTIPLE SCLEROSIS.  PT FROM HOME AND IS BEDBOUND; LIVES WITH SPOUSE.  PT ACTIVE WITH [facility] FOR HHRN.     Action/Plan:   WILL FOLLOW FOR NEEDS AS PT PROGRESSES.  WILL NEED RESUMPTION OF CARE ORDERS FOR HH CARE PRIOR TO DC.   Anticipated DC Date:  07/26/2013   Anticipated DC Plan:  HOME W HOME HEALTH SERVICES      DC Planning Services  CM consult      [facility] Choice  Resumption Of Svcs/PTA Provider   Choice offered to / List presented to:             [facility] agency  [facility]   Status of service:  In process, will continue to follow Medicare Important Message given?   (If response is "NO", the following Medicare IM given date fields will be blank) Date Medicare IM given:   Date Additional Medicare IM given:    Discharge Disposition:    Per UR Regulation:  Reviewed for med. necessity/level of care/duration of stay  If discussed at Long Length of Stay Meetings, dates discussed:    Comments:

## 2013-07-24 NOTE — ED Notes (Signed)
Spoke with dispatch at [facility] awaiting arrival.

## 2013-07-24 NOTE — H&P (Signed)
[facility] History and Physical  Patient: Ashley CroakCynthia A Stanley  WUJ:[id number]  DOB: 1950-07-09  DOS: the patient was seen and examined on 07/23/2013 PCP: [name], [name], MD  Chief Complaint: Hallucination  HPI: Ashley CroakCynthia A Stanley is a 63 y.o. female with Past medical history of multiple sclerosis, neurogenic bladder with recurrent UTI, hypothyroidism, GERD, right eye blindness, diabetes mellitus. The patient is coming from home. The patient was brought in by her friend and caregiver. The history has been obtained from ED physician as well as friend and patient. The patient appears poor historian due to her confusion. Aberrant is since last one week the patient has been having residual hallucination. She is seen people, her dog which are not present. It is unclear whether she has auditory hallucination. She mentions that during the same time. She started having burning urination with increased frequency again. She also mentioned about chills denies any fever nausea vomiting abdominal pain. During the examination the patient had many times irrelevant answers to simple questions. Patient was recently started on Zanaflex instead of Flexeril and seroquel by her neurologist. After 2-3 days of taking medications he started having this episode of confusion. They called the neurologist last Friday was then to stop taking the Zanaflex and continue the seroquel. Since her condition was further worsening they called the neurologist again recommended to go to the ED and stop taking the seroquel.   Review of Systems: as mentioned the history of present illness.  A Comprehensive review of the other systems is negative.  Past Medical History  Diagnosis Date  . Urinary incontinence     neurogenic bladder--hx of uti's  . Thyroid disease   . Multiple sclerosis   . GERD (gastroesophageal reflux disease)   . Hypothyroidism   . Anxiety   . Blindness of right eye     complication of ms  . Arthritis    old falls-with injuries to rt knee and both ankles and rt wrist--states she has some pain in these areas-probable arthritis  . Cellulitis     hx of cellulitis in lower extremities -requiring multiple hospitalzations in the past--no problems in las 2 yrs  . Neuromuscular disorder     ms-not ambulatory-muscle spasms-and recent tremors left arm and left  leg have developed--dr. Daphane ShepherdMeyer in [place] is pt's neurologist  . Diabetes mellitus without complication    Past Surgical History  Procedure Laterality Date  . Cholecystectomy    . Tonsillectomy    . Ankle surgery    . Wrist surgery    . Lymph gland excision    . Mediastinoscopy  04/24/10    for enlarged right paratracheal lymph node--[facility]. Burney--pt states she does not know the  what the pathology report showed.  . Adenoidectomy    . Fracture surgery  2005    orif left ankle fx  . Cystoscopy  07/02/2011    Procedure: CYSTOSCOPY;  Surgeon: Kathi LudwigSigmund I Tannenbaum, MD;  Location: [facility];  Service: Urology;  Laterality: N/A;   Social History:  reports that she has never smoked. She has never used smokeless tobacco. She reports that she does not drink alcohol or use illicit drugs. Partial Independent for most of her  ADL.  Allergies  Allergen Reactions  . Avelox [Moxifloxacin Hcl In Nacl] Hives and Other (See Comments)    Panic attack  . Ciprofloxacin Hcl Hives and Other (See Comments)    Panic attack  . Penicillins Hives  . Shrimp [Shellfish Allergy] Other (See Comments)  flushing    No family history on file.  Prior to Admission medications   Medication Sig Start Date End Date Taking? Authorizing Provider  acetaminophen (TYLENOL) 500 MG tablet Take 1,000 mg by mouth at bedtime as needed for mild pain.   Yes Historical Provider, MD  ALPRAZolam Prudy Feeler) 0.5 MG tablet Take 0.5 mg by mouth at bedtime as needed for anxiety.   Yes Historical Provider, MD  aspirin EC 81 MG tablet Take 81 mg by mouth daily.   Yes  Historical Provider, MD  atorvastatin (LIPITOR) 20 MG tablet Take 20 mg by mouth at bedtime.   Yes Historical Provider, MD  bisacodyl (BISACODYL) 5 MG EC tablet Take 1 tablet (5 mg total) by mouth daily as needed for moderate constipation. 05/11/13  Yes Jeralyn Bennett, MD  CRANBERRY PO Take 1 tablet by mouth daily as needed (for urinary pain).    Yes Historical Provider, MD  darifenacin (ENABLEX) 7.5 MG 24 hr tablet Take 7.5 mg by mouth 2 (two) times daily.   Yes Historical Provider, MD  diphenhydrAMINE (BENADRYL) 25 MG tablet Take 50 mg by mouth every 6 (six) hours as needed for itching.   Yes Historical Provider, MD  docusate sodium (COLACE) 100 MG capsule Take 1 capsule (100 mg total) by mouth 2 (two) times daily. 05/11/13  Yes Jeralyn Bennett, MD  esomeprazole (NEXIUM) 40 MG capsule Take 40 mg by mouth every morning.    Yes Historical Provider, MD  furosemide (LASIX) 20 MG tablet Take 20 mg by mouth 2 (two) times daily.   Yes Historical Provider, MD  gabapentin (NEURONTIN) 400 MG capsule Take 800 mg by mouth 2 (two) times daily.   Yes Historical Provider, MD  levothyroxine (SYNTHROID) 125 MCG tablet Take 1 tablet (125 mcg total) by mouth daily before breakfast. 05/11/13  Yes Jeralyn Bennett, MD  metFORMIN (GLUCOPHAGE) 500 MG tablet Take 250 mg by mouth daily.    Yes Historical Provider, MD  mineral oil-hydrophilic petrolatum (AQUAPHOR) ointment Apply 1 application topically as needed for dry skin.   Yes Historical Provider, MD  nystatin (MYCOSTATIN/NYSTOP) 100000 UNIT/GM POWD Apply 1 Bottle topically 2 (two) times daily. To perineal/groin area and right great toe as needed for yeast   Yes Historical Provider, MD  nystatin cream (MYCOSTATIN) Apply 1 application topically as needed for dry skin.   Yes Historical Provider, MD  polyethylene glycol (MIRALAX / GLYCOLAX) packet Take 17 g by mouth daily.   Yes Historical Provider, MD  QUEtiapine (SEROQUEL) 25 MG tablet Take 25 mg by mouth at bedtime.    Yes Historical Provider, MD  simethicone (MYLICON) 80 MG chewable tablet Chew 160 mg by mouth every 6 (six) hours as needed for flatulence.   Yes Historical Provider, MD  tiZANidine (ZANAFLEX) 4 MG tablet Take 2-4 mg by mouth. Up to four times per day for muscle spasms.   Yes Historical Provider, MD  trimethoprim (TRIMPEX) 100 MG tablet Take 100 mg by mouth daily.   Yes Historical Provider, MD  Multiple Vitamin (MULTIVITAMIN WITH MINERALS) TABS tablet Take 1 tablet by mouth daily.    Historical Provider, MD    Physical Exam: Filed Vitals:   07/23/13 1719 07/23/13 2116 07/24/13 0041  BP: 102/68 117/98 114/77  Pulse: 81 97 106  Temp: 98.2 F (36.8 C)  99 F (37.2 C)  TempSrc: Oral  Oral  Resp: 14 14 20   SpO2: 91% 93% 90%    General: Alert, Awake and Oriented to Time, Place and Person. Appear  in mild distress Eyes: PERRL ENT: Oral Mucosa clear moist. Neck: no JVD Cardiovascular: S1 and S2 Present, no Murmur, Peripheral Pulses Present Respiratory: Bilateral Air entry equal and Decreased, Clear to Auscultation,  no Crackles,no wheezes Abdomen: Bowel Sound Present, Soft and Non tender Skin: no Rash Extremities: Bilateral Pedal edema, no calf tenderness Neurologic: Mental status awake but confused still orientation is present, Cranial Nerves pupils are reactive, Motor strength bilaterally equal upper extremity right lower extremity 2 x 5 left lower extremity 4 x 5, Sensation intact bilaterally, reflexes present, babinski negative.  Labs on Admission:  CBC:  Recent Labs Lab 07/23/13 1718  WBC 12.9*  NEUTROABS 10.0*  HGB 14.9  HCT 44.3  MCV 91.0  PLT 369    CMP     Component Value Date/Time   NA 139 07/23/2013 1718   K 2.8* 07/23/2013 1718   CL 96 07/23/2013 1718   CO2 26 07/23/2013 1718   GLUCOSE 102* 07/23/2013 1718   BUN 10 07/23/2013 1718   CREATININE 0.55 07/23/2013 1718   CALCIUM 8.9 07/23/2013 1718   PROT 7.5 07/23/2013 1718   ALBUMIN 3.7 07/23/2013 1718   AST 17  07/23/2013 1718   ALT 8 07/23/2013 1718   ALKPHOS 149* 07/23/2013 1718   BILITOT 0.6 07/23/2013 1718   GFRNONAA >90 07/23/2013 1718   GFRAA >90 07/23/2013 1718    No results found for this basename: LIPASE, AMYLASE,  in the last 168 hours No results found for this basename: AMMONIA,  in the last 168 hours  No results found for this basename: CKTOTAL, CKMB, CKMBINDEX, TROPONINI,  in the last 168 hours BNP (last 3 results) No results found for this basename: PROBNP,  in the last 8760 hours  Radiological Exams on Admission: Ct Head Wo Contrast  07/23/2013   CLINICAL DATA:  Delusions  EXAM: CT HEAD WITHOUT CONTRAST  TECHNIQUE: Contiguous axial images were obtained from the base of the skull through the vertex without intravenous contrast.  COMPARISON:  March 29, 2010  FINDINGS: There is chronic diffuse atrophy. Chronic bilateral periventricular white matter small vessel ischemic changes identified. There is no midline shift, hydrocephalus, or mass. No acute hemorrhage or acute transcortical infarct is identified. The bony calvarium is intact. There is mucoperiosteal thickening of the left sphenoid sinus.  IMPRESSION: No focal acute intracranial abnormality identified. Atrophy. Chronic small vessel ischemic change.   Electronically Signed   By: Sherian Rein M.D.   On: 07/23/2013 21:25    Assessment/Plan Principal Problem:   Acute delirium Active Problems:   Multiple sclerosis   GERD (gastroesophageal reflux disease)   Hypothyroidism   Diabetes mellitus without complication   UTI (lower urinary tract infection)   1. Acute delirium The patient is presenting with an episode of acute status change, or acute encephalopathy. A CT scan is negative for any acute abnormality. She does not have any next of kin is on exam. Psychiatric has evaluated the patient and does not think patient has acute psychosis. Her urinalysis is positive for pyuria. She has chronic right-sided weakness and does not  appear to be having any exacerbation of her MS. The patient will be started on IV ceftriaxone for UTI. Serial neuro checks will be done. Telemetry monitoring. At present I would hold her off all her psychotic and neurologically active medications.  2. Diabetes mellitus Sliding scale  3. Hypothyroidism Check TSH Continue Synthroid  4. Multiple sclerosis At present appears stable Treat UTI Have discussed the case briefly on phone with  the neurologist who agrees with the management.  DVT Prophylaxis: subcutaneous Heparin Nutrition: Advance as tolerated  Code Status: Full, presumed Family Communication: Friend was present at bedside, opportunity was given to ask question and all questions were answered satisfactorily at the time of interview. Disposition: Admitted to inpatient in telemetry unit.  Author: Lynden Oxford, MD [facility] Pager: [phone] 07/24/2013, 1:04 AM    If 7PM-7AM, please contact night-coverage [email or web address] Password [email or web address]

## 2013-07-24 NOTE — ED Provider Notes (Signed)
Medical screening examination/treatment/procedure(s) were performed by non-physician practitioner and as supervising physician I was immediately available for consultation/collaboration.  EKG Interpretation    Date/Time:  Thursday July 23 2013 20:04:11 EST Ventricular Rate:  89 PR Interval:  165 QRS Duration: 90 QT Interval:  370 QTC Calculation: 450 R Axis:   162 Text Interpretation:  Sinus rhythm Anterior infarct, age indeterminate poor baseline No significant change since last tracing Confirmed by Mili Piltz  MD, Adelee Hannula [phone]) on 07/23/2013 8:16:57 PM              Richardean Canal, MD 07/24/13 [phone]

## 2013-07-24 NOTE — ED Notes (Signed)
[facility] in [facility] to ED to transport pt.

## 2013-07-24 NOTE — Progress Notes (Signed)
INITIAL NUTRITION ASSESSMENT  DOCUMENTATION CODES Per approved criteria  -Not Applicable   INTERVENTION: Glucerna Shake po daily, each supplement provides 220 kcal and 10 grams of protein RD to follow for nutrition care plan  NUTRITION DIAGNOSIS: Increased nutrient needs related to wound healing as evidenced by estimated nutrition needs   Goal: Pt to meet >/= 90% of their estimated nutrition needs   Monitor:  PO & supplemental intake, weight, labs, I/O's  Reason for Assessment: Low Braden  63 y.o. female  Admitting Dx: Acute delirium  ASSESSMENT: Patient with PMH of multiple sclerosis; brought in by her caregiver and friend for acute delirium.  Nurse Tech feeding patient upon RD visit; patient reports she having a difficult time feeding herself due to her MS; PO intake 50% at lunch; weight down per readings, however, patient reports she's been "cutting back" trying to lose some weight; feel she'd benefit from addition of supplement to help meet protein needs; patient amenable to trying Glucerna Shake; RD to order.  Height: Ht Readings from Last 1 Encounters:  05/09/13 5\' 2"  (1.575 m)    Weight: Wt Readings from Last 1 Encounters:  07/24/13 161 lb 13.1 oz (73.4 kg)    Ideal Body Weight: 110 lb  % Ideal Body Weight: 146%  Wt Readings from Last 10 Encounters:  07/24/13 161 lb 13.1 oz (73.4 kg)  05/11/13 221 lb 6.4 oz (100.426 kg)  03/07/12 240 lb (108.863 kg)  02/06/12 234 lb (106.142 kg)  07/02/11 209 lb (94.802 kg)  07/02/11 209 lb (94.802 kg)    Usual Body Weight: 221 lb  % Usual Body Weight: 73%  BMI:  Body mass index is 29.59 kg/(m^2).  Estimated Nutritional Needs: Kcal: 1800-2000 Protein: 90-100 gm Fluid: 1.8-2.0 L  Skin: Stage II ulcer to sacrum, buttocks & Stage I to heel  Diet Order: Carb Control  EDUCATION NEEDS: -No education needs identified at this time   Intake/Output Summary (Last 24 hours) at 07/24/13 1342 Last data filed at  07/24/13 1215  Gross per 24 hour  Intake    420 ml  Output      0 ml  Net    420 ml    Labs:   Recent Labs Lab 07/23/13 1718 07/24/13 1145  NA 139 142  K 2.8* 3.4*  CL 96 97  CO2 26 24  BUN 10 9  CREATININE 0.55 0.58  CALCIUM 8.9 9.4  GLUCOSE 102* 80    CBG (last 3)   Recent Labs  07/24/13 0117 07/24/13 0634 07/24/13 1315  GLUCAP 100* 96 101*    Scheduled Meds: . aspirin EC  81 mg Oral Daily  . atorvastatin  20 mg Oral QHS  . cefTRIAXone (ROCEPHIN)  IV  1 g Intravenous Q24H  . darifenacin  7.5 mg Oral BID  . docusate sodium  100 mg Oral BID  . enoxaparin (LOVENOX) injection  40 mg Subcutaneous Q24H  . furosemide  20 mg Oral BID  . gabapentin  800 mg Oral BID  . insulin aspart  0-5 Units Subcutaneous QHS  . insulin aspart  0-9 Units Subcutaneous TID WC  . levothyroxine  125 mcg Oral QAC breakfast  . pantoprazole  40 mg Oral Daily  . sodium chloride  3 mL Intravenous Q12H  . trimethoprim  100 mg Oral Daily    Continuous Infusions:   Past Medical History  Diagnosis Date  . Urinary incontinence     neurogenic bladder--hx of uti's  . Thyroid disease   .  Multiple sclerosis   . GERD (gastroesophageal reflux disease)   . Hypothyroidism   . Anxiety   . Blindness of right eye     complication of ms  . Arthritis     old falls-with injuries to rt knee and both ankles and rt wrist--states she has some pain in these areas-probable arthritis  . Cellulitis     hx of cellulitis in lower extremities -requiring multiple hospitalzations in the past--no problems in las 2 yrs  . Neuromuscular disorder     ms-not ambulatory-muscle spasms-and recent tremors left arm and left  leg have developed--dr. Daphane Shepherd in [place] is pt's neurologist  . Diabetes mellitus without complication     Past Surgical History  Procedure Laterality Date  . Cholecystectomy    . Tonsillectomy    . Ankle surgery    . Wrist surgery    . Lymph gland excision    . Mediastinoscopy   04/24/10    for enlarged right paratracheal lymph node--surgery at [facility]-Dr. Burney--pt states she does not know the  what the pathology report showed.  . Adenoidectomy    . Fracture surgery  2005    orif left ankle fx  . Cystoscopy  07/02/2011    Procedure: CYSTOSCOPY;  Surgeon: Kathi Ludwig, MD;  Location: [facility];  Service: Urology;  Laterality: N/A;    Maureen Chatters, RD, LDN Pager #: [phone] After-Hours Pager #: [phone]

## 2013-07-24 NOTE — Consult Note (Signed)
WOC wound consult note Reason for Consult: evaluate wounds. Pt with MS and bedbound. Areas noted at admission on the buttocks Wound type: sheer injury left and right buttock, fissure in the apex of the gluteal cleft.  Wound bed: all areas are just some partial thickness skin peeling, the apex of the gluteal cleft is pink, linear opening Drainage (amount, consistency, odor) none Periwound:intact but difficult to assess with the amount of powders present on the skin Dressing procedure/placement/frequency:foam for protection of these areas, turn and reposition frequently.   Discussed POC with patient and bedside nurse.  Re consult if needed, will not follow at this time. Thanks  Essence Merle [employer], CWOCN [phone])

## 2013-07-24 NOTE — Progress Notes (Signed)
Patient admitted earlier this morning by my partner, history of multiple sclerosis bedbound, admitted for UTI, continue empiric antibiotics. Low potassium being supplemented we'll repeat potassium this evening and again tomorrow morning along with magnesium. Wound care consulted for decubitus ulcers.

## 2013-07-25 LAB — BASIC METABOLIC PANEL
BUN: 9 mg/dL (ref 6–23)
CALCIUM: 9.2 mg/dL (ref 8.4–10.5)
CHLORIDE: 99 meq/L (ref 96–112)
CO2: 24 meq/L (ref 19–32)
Creatinine, Ser: 0.61 mg/dL (ref 0.50–1.10)
GFR calc Af Amer: >90 mL/min (ref >90)
GFR calc non Af Amer: >90 mL/min (ref >90)
Glucose, Bld: 96 mg/dL (ref 70–99)
Potassium: 3.5 mEq/L — ABNORMAL LOW (ref 3.7–5.3)
SODIUM: 143 meq/L (ref 137–147)

## 2013-07-25 LAB — CBC
HCT: 43.1 % (ref 36.0–46.0)
Hemoglobin: 14.6 g/dL (ref 12.0–15.0)
MCH: 30.7 pg (ref 26.0–34.0)
MCHC: 33.9 g/dL (ref 30.0–36.0)
MCV: 90.7 fL (ref 78.0–100.0)
Platelets: 415 10*3/uL — ABNORMAL HIGH (ref 150–400)
RBC: 4.75 MIL/uL (ref 3.87–5.11)
RDW: 14.5 % (ref 11.5–15.5)
WBC: 8.5 10*3/uL (ref 4.0–10.5)

## 2013-07-25 LAB — DRUGS OF ABUSE SCREEN W/O ALC, ROUTINE URINE
AMPHETAMINE SCRN UR: NEGATIVE
BENZODIAZEPINES.: NEGATIVE
Barbiturate Quant, Ur: NEGATIVE
Cocaine Metabolites: NEGATIVE
Creatinine,U: 111.4 mg/dL
METHADONE: NEGATIVE
Marijuana Metabolite: NEGATIVE
Opiate Screen, Urine: NEGATIVE
PHENCYCLIDINE (PCP): NEGATIVE
PROPOXYPHENE: NEGATIVE

## 2013-07-25 LAB — GLUCOSE, CAPILLARY
GLUCOSE-CAPILLARY: 106 mg/dL — AB (ref 70–99)
GLUCOSE-CAPILLARY: 97 mg/dL (ref 70–99)
Glucose-Capillary: 103 mg/dL — ABNORMAL HIGH (ref 70–99)

## 2013-07-25 LAB — MAGNESIUM: Magnesium: 1.8 mg/dL (ref 1.5–2.5)

## 2013-07-25 MED ORDER — CEFUROXIME AXETIL 500 MG PO TABS: 500.0000 mg | ORAL_TABLET | Freq: Two times a day (BID) | ORAL | Status: DC

## 2013-07-25 MED ORDER — POTASSIUM CHLORIDE CRYS ER 20 MEQ PO TBCR
40.0000 meq | EXTENDED_RELEASE_TABLET | Freq: Once | ORAL | Status: AC
  Administered 2013-07-25: 40 meq via ORAL
  Filled 2013-07-25: qty 2

## 2013-07-25 NOTE — Discharge Instructions (Signed)
Follow with Primary MD [name], [name], MD in 2 days   Get CBC, CMP, checked 2 days by Primary MD and again as instructed by your Primary MD. Get final urine culture results and blood culture results checked.   Activity: As tolerated with Full fall precautions use walker/cane & assistance as needed   Disposition Home     Diet: Heart Healthy  - Low Carb  For Heart failure patients - Check your Weight same time everyday, if you gain over 2 pounds, or you develop in leg swelling, experience more shortness of breath or chest pain, call your Primary MD immediately. Follow Cardiac Low Salt Diet and 1.8 lit/day fluid restriction.   On your next visit with her primary care physician please Get Medicines reviewed and adjusted.  Please request your Prim.MD to go over all Hospital Tests and Procedure/Radiological results at the follow up, please get all Hospital records sent to your Prim MD by signing hospital release before you go home.   If you experience worsening of your admission symptoms, develop shortness of breath, life threatening emergency, suicidal or homicidal thoughts you must seek medical attention immediately by calling 911 or calling your MD immediately  if symptoms less severe.  You Must read complete instructions/literature along with all the possible adverse reactions/side effects for all the Medicines you take and that have been prescribed to you. Take any new Medicines after you have completely understood and accpet all the possible adverse reactions/side effects.   Do not drive and provide baby sitting services if your were admitted for syncope or siezures until you have seen by Primary MD or a Neurologist and advised to do so again.  Do not drive when taking Pain medications.    Do not take more than prescribed Pain, Sleep and Anxiety Medications  Special Instructions: If you have smoked or chewed Tobacco  in the last 2 yrs please stop smoking, stop any regular Alcohol  and  or any Recreational drug use.  Wear Seat belts while driving.   Please note  You were cared for by a hospitalist during your hospital stay. If you have any questions about your discharge medications or the care you received while you were in the hospital after you are discharged, you can call the unit and asked to speak with the hospitalist on call if the hospitalist that took care of you is not available. Once you are discharged, your primary care physician will handle any further medical issues. Please note that NO REFILLS for any discharge medications will be authorized once you are discharged, as it is imperative that you return to your primary care physician (or establish a relationship with a primary care physician if you do not have one) for your aftercare needs so that they can reassess your need for medications and monitor your lab values.

## 2013-07-25 NOTE — Progress Notes (Signed)
   CARE MANAGEMENT NOTE 07/25/2013  Patient:  [name],[name]   Account Number:  [id number]  Date Initiated:  07/24/2013  Documentation initiated by:  AMERSON,JULIE  Subjective/Objective Assessment:   PT ADM ON 1/22 WITH UTI, DELIRIUM.  PMH OF MULTIPLE SCLEROSIS.  PT FROM HOME AND IS BEDBOUND; LIVES WITH SPOUSE.  PT ACTIVE WITH [facility] FOR HHRN.     Action/Plan:   WILL FOLLOW FOR NEEDS AS PT PROGRESSES.  WILL NEED RESUMPTION OF CARE ORDERS FOR HH CARE PRIOR TO DC.   Anticipated DC Date:  07/25/2013   Anticipated DC Plan:  HOME W HOME HEALTH SERVICES      DC Planning Services  CM consult      [facility] Choice  Resumption Of Svcs/PTA Provider   Choice offered to / List presented to:          [facility] arranged  HH-1 RN  HH-2 PT  HH-4 NURSE'S AIDE  HH-6 SOCIAL WORKER      HH agency  [facility]   Status of service:  Completed, signed off Medicare Important Message given?   (If response is "NO", the following Medicare IM given date fields will be blank) Date Medicare IM given:   Date Additional Medicare IM given:    Discharge Disposition:  HOME W HOME HEALTH SERVICES  Per UR Regulation:  Reviewed for med. necessity/level of care/duration of stay  If discussed at Long Length of Stay Meetings, dates discussed:    Comments:  07/25/13 09:45 CM spoke with pt in room to see if she needed any DME.  Pt statres she has all she needs at this time. [facility] notified of discharge and resumption of care.  Pt will be receiving HHPT/RN/SW/aide. Transportation home by ambulance.   No other CM needs were communicated.  Freddy Jaksch, BSN, CN [phone].

## 2013-07-25 NOTE — Progress Notes (Signed)
Patient is medically stable for D/C home today. Clinical Child psychotherapist (CSW) arranged non-emergency EMS ([facility]) for transport. CSW confirmed with patient's caregiver Karie Kirks that patient's address is the same address listed on EPIC. Karie Kirks reported that another caregiver is at the patient's home right now [place]. Please reconsult if further social work needs arise. CSW signing off.   Jetta Lout, LCSWA Weekend CSW [phone]

## 2013-07-25 NOTE — Discharge Summary (Signed)
Ashley CroakCynthia A Stanley, is a 63 y.o. female  DOB 1950/10/12  MRN [id number].  Admission date:  07/23/2013  Admitting Physician  Lynden OxfordPranav Patel, MD  Discharge Date:  07/25/2013   Primary MD  [name], [name], MD  Recommendations for primary care physician for things to follow:   Repeat BMP and CBC in 2 days, kindly follow final blood and urine culture results   Admission Diagnosis  UTI (lower urinary tract infection) [599.0]  Discharge Diagnosis   metabolic encephalopathy caused by UTI  Principal Problem:   Acute delirium Active Problems:   Multiple sclerosis   GERD (gastroesophageal reflux disease)   Hypothyroidism   Diabetes mellitus without complication   UTI (lower urinary tract infection)      Past Medical History  Diagnosis Date  . Urinary incontinence     neurogenic bladder--hx of uti's  . Thyroid disease   . Multiple sclerosis   . GERD (gastroesophageal reflux disease)   . Hypothyroidism   . Anxiety   . Blindness of right eye     complication of ms  . Arthritis     old falls-with injuries to rt knee and both ankles and rt wrist--states she has some pain in these areas-probable arthritis  . Cellulitis     hx of cellulitis in lower extremities -requiring multiple hospitalzations in the past--no problems in las 2 yrs  . Neuromuscular disorder     ms-not ambulatory-muscle spasms-and recent tremors left arm and left  leg have developed--dr. Daphane ShepherdMeyer in [place] is pt's neurologist  . Diabetes mellitus without complication     Past Surgical History  Procedure Laterality Date  . Cholecystectomy    . Tonsillectomy    . Ankle surgery    . Wrist surgery    . Lymph gland excision    . Mediastinoscopy  04/24/10    for enlarged right paratracheal lymph node--surgery at [facility]-Dr. Burney--pt states she does not know the  what  the pathology report showed.  . Adenoidectomy    . Fracture surgery  2005    orif left ankle fx  . Cystoscopy  07/02/2011    Procedure: CYSTOSCOPY;  Surgeon: Kathi LudwigSigmund I Tannenbaum, MD;  Location: [facility];  Service: Urology;  Laterality: N/A;     Discharge Condition: stable    Follow UP  Follow-up Information   Follow up with [name], [name], MD. Schedule an appointment as soon as possible for a visit in 2 days.   Specialty:  Family Medicine   Contact information:   [id number] [place] [id number] [phone]         Consults obtained -     Discharge Medications      Medication List         acetaminophen 500 MG tablet  Commonly known as:  TYLENOL  Take 1,000 mg by mouth at bedtime as needed for mild pain.     ALPRAZolam 0.5 MG tablet  Commonly known as:  XANAX  Take 0.5 mg by mouth at bedtime as  needed for anxiety.     aspirin EC 81 MG tablet  Take 81 mg by mouth daily.     atorvastatin 20 MG tablet  Commonly known as:  LIPITOR  Take 20 mg by mouth at bedtime.     bisacodyl 5 MG EC tablet  Commonly known as:  bisacodyl  Take 1 tablet (5 mg total) by mouth daily as needed for moderate constipation.     cefUROXime 500 MG tablet  Commonly known as:  CEFTIN  Take 1 tablet (500 mg total) by mouth 2 (two) times daily with a meal.     CRANBERRY PO  Take 1 tablet by mouth daily as needed (for urinary pain).     darifenacin 7.5 MG 24 hr tablet  Commonly known as:  ENABLEX  Take 7.5 mg by mouth 2 (two) times daily.     diphenhydrAMINE 25 MG tablet  Commonly known as:  BENADRYL  Take 50 mg by mouth every 6 (six) hours as needed for itching.     docusate sodium 100 MG capsule  Commonly known as:  COLACE  Take 1 capsule (100 mg total) by mouth 2 (two) times daily.     esomeprazole 40 MG capsule  Commonly known as:  NEXIUM  Take 40 mg by mouth every morning.     furosemide 20 MG tablet  Commonly known as:  LASIX  Take 20 mg by mouth 2  (two) times daily.     gabapentin 400 MG capsule  Commonly known as:  NEURONTIN  Take 800 mg by mouth 2 (two) times daily.     levothyroxine 125 MCG tablet  Commonly known as:  SYNTHROID  Take 1 tablet (125 mcg total) by mouth daily before breakfast.     metFORMIN 500 MG tablet  Commonly known as:  GLUCOPHAGE  Take 250 mg by mouth daily.     mineral oil-hydrophilic petrolatum ointment  Apply 1 application topically as needed for dry skin.     multivitamin with minerals Tabs tablet  Take 1 tablet by mouth daily.     nystatin cream  Commonly known as:  MYCOSTATIN  Apply 1 application topically as needed for dry skin.     nystatin 100000 UNIT/GM Powd  Apply 1 Bottle topically 2 (two) times daily. To perineal/groin area and right great toe as needed for yeast     polyethylene glycol packet  Commonly known as:  MIRALAX / GLYCOLAX  Take 17 g by mouth daily.     QUEtiapine 25 MG tablet  Commonly known as:  SEROQUEL  Take 25 mg by mouth at bedtime.     simethicone 80 MG chewable tablet  Commonly known as:  MYLICON  Chew 160 mg by mouth every 6 (six) hours as needed for flatulence.     tiZANidine 4 MG tablet  Commonly known as:  ZANAFLEX  Take 2-4 mg by mouth. Up to four times per day for muscle spasms.     trimethoprim 100 MG tablet  Commonly known as:  TRIMPEX  Take 100 mg by mouth daily.         Diet and Activity recommendation: See Discharge Instructions below   Discharge Instructions     Follow with Primary MD Florentina Jenny, MD in 2 days   Get CBC, CMP, checked 2 days by Primary MD and again as instructed by your Primary MD. Get final urine and blood culture results reviewed.   Activity: As tolerated with Full fall precautions use walker/cane & assistance  as needed   Disposition Home     Diet: Heart Healthy - low carbohydrate.  For Heart failure patients - Check your Weight same time everyday, if you gain over 2 pounds, or you develop in leg swelling,  experience more shortness of breath or chest pain, call your Primary MD immediately. Follow Cardiac Low Salt Diet and 1.8 lit/day fluid restriction.   On your next visit with her primary care physician please Get Medicines reviewed and adjusted.  Please request your Prim.MD to go over all Hospital Tests and Procedure/Radiological results at the follow up, please get all Hospital records sent to your Prim MD by signing hospital release before you go home.   If you experience worsening of your admission symptoms, develop shortness of breath, life threatening emergency, suicidal or homicidal thoughts you must seek medical attention immediately by calling 911 or calling your MD immediately  if symptoms less severe.  You Must read complete instructions/literature along with all the possible adverse reactions/side effects for all the Medicines you take and that have been prescribed to you. Take any new Medicines after you have completely understood and accpet all the possible adverse reactions/side effects.   Do not drive and provide baby sitting services if your were admitted for syncope or siezures until you have seen by Primary MD or a Neurologist and advised to do so again.  Do not drive when taking Pain medications.    Do not take more than prescribed Pain, Sleep and Anxiety Medications  Special Instructions: If you have smoked or chewed Tobacco  in the last 2 yrs please stop smoking, stop any regular Alcohol  and or any Recreational drug use.  Wear Seat belts while driving.   Please note  You were cared for by a hospitalist during your hospital stay. If you have any questions about your discharge medications or the care you received while you were in the hospital after you are discharged, you can call the unit and asked to speak with the hospitalist on call if the hospitalist that took care of you is not available. Once you are discharged, your primary care physician will handle any further  medical issues. Please note that NO REFILLS for any discharge medications will be authorized once you are discharged, as it is imperative that you return to your primary care physician (or establish a relationship with a primary care physician if you do not have one) for your aftercare needs so that they can reassess your need for medications and monitor your lab values.     Major procedures and Radiology Reports - PLEASE review detailed and final reports for all details, in brief -       Ct Head Wo Contrast  07/23/2013   CLINICAL DATA:  Delusions  EXAM: CT HEAD WITHOUT CONTRAST  TECHNIQUE: Contiguous axial images were obtained from the base of the skull through the vertex without intravenous contrast.  COMPARISON:  March 29, 2010  FINDINGS: There is chronic diffuse atrophy. Chronic bilateral periventricular white matter small vessel ischemic changes identified. There is no midline shift, hydrocephalus, or mass. No acute hemorrhage or acute transcortical infarct is identified. The bony calvarium is intact. There is mucoperiosteal thickening of the left sphenoid sinus.  IMPRESSION: No focal acute intracranial abnormality identified. Atrophy. Chronic small vessel ischemic change.   Electronically Signed   By: Sherian Rein M.D.   On: 07/23/2013 21:25    Micro Results      Recent Results (from the past 240 hour(s))  URINE CULTURE     Status: None   Collection Time    07/23/13  8:40 PM      Result Value Range Status   Specimen Description URINE, CATHETERIZED   Final   Special Requests NONE   Final   Culture  Setup Time     Final   Value: 07/24/2013 03:47     Performed at [facility]   Colony Count PENDING   Incomplete   Culture     Final   Value: Culture reincubated for better growth     Performed at [facility]   Report Status PENDING   Incomplete     History of present illness and  Hospital Course:     Kindly see H&P for history of present illness and  admission details, please review complete Labs, Consult reports and Test reports for all details in brief [name], is a 63 y.o. female, patient with history of multiple sclerosis who is been largely bedbound for the last 4 years, recurrent UTIs, neurogenic bladder, GERD, right eye blindness, type 2 diabetes mellitus, hypothyroidism was admitted to the hospital with chief complaints of hallucinations and dysuria.     She was found to have metabolic encephalopathy caused by UTI, she was treated with IV Rocephin with good results and she is now back to her baseline. She will be now placed on Ceftin for 5 more days, she will follow with primary care physician in the next 2 days will request primary care physician to kindly repeat a CBC BMP and please followup on the final culture results. She will also get home health upon discharge along with nursing for early stage II sacral decubitus ulcers. She is to be back to her baseline, mental status is good, afebrile, leukocytosis has resolved and she is eager to go home .     For her other problems which include multiple sclerosis, type 2 diabetes mellitus, hypothyroidism she will continue her home medications unchanged and follow with PCP in a timely fashion.    Her potassium was slightly low today which has been repleted. Request PCP to repeat BMP in 2 days.     Today   Subjective:   [name] today has no headache,no chest abdominal pain,no new weakness tingling or numbness, feels much better wants to go home today.   Objective:   Blood pressure 140/76, pulse 97, temperature 98.6 F (37 C), temperature source Oral, resp. rate 18, weight 73.4 kg (161 lb 13.1 oz), SpO2 93.00%.   Intake/Output Summary (Last 24 hours) at 07/25/13 0921 Last data filed at 07/25/13 0514  Gross per 24 hour  Intake    667 ml  Output   1025 ml  Net   -358 ml    Exam Awake Alert, Oriented *3, No new F.N deficits, Normal  affect [place].AT,PERRAL Supple Neck,No JVD, No cervical lymphadenopathy appriciated.  Symmetrical Chest wall movement, Good air movement bilaterally, CTAB RRR,No Gallops,Rubs or new Murmurs, No Parasternal Heave +ve B.Sounds, Abd Soft, Non tender, No organomegaly appriciated, No rebound -guarding or rigidity. No Cyanosis, Clubbing or edema, No new Rash or bruise  Data Review   CBC w Diff: Lab Results  Component Value Date   WBC 8.5 07/25/2013   HGB 14.6 07/25/2013   HCT 43.1 07/25/2013   PLT 415* 07/25/2013   LYMPHOPCT 14 07/23/2013   MONOPCT 6 07/23/2013   EOSPCT 2 07/23/2013   BASOPCT 1 07/23/2013    CMP: Lab Results  Component Value  Date   NA 143 07/25/2013   K 3.5* 07/25/2013   CL 99 07/25/2013   CO2 24 07/25/2013   BUN 9 07/25/2013   CREATININE 0.61 07/25/2013   PROT 8.1 07/24/2013   ALBUMIN 3.9 07/24/2013   BILITOT 0.7 07/24/2013   ALKPHOS 153* 07/24/2013   AST 24 07/24/2013   ALT 9 07/24/2013  .   Total Time in preparing paper work, data evaluation and todays exam - 35 minutes  Leroy Sea M.D on 07/25/2013 at 9:21 AM  [facility] Office  [phone]

## 2013-07-25 NOTE — Progress Notes (Signed)
Patient confused, unable to sign discharge paperwork. No family at bedside.  I called home health aide, Rosyln, and went over discharge instructions with her. IV discontinued, and patient taken off cardiac monitor. Patient discharged home via EMS transport. Home Health Aide, Rosyln at patients place of residence awaiting patients arrival.  [name]

## 2013-07-27 LAB — URINE CULTURE

## 2013-09-01 ENCOUNTER — Inpatient Hospital Stay (HOSPITAL_BASED_OUTPATIENT_CLINIC_OR_DEPARTMENT_OTHER)
Admission: EM | Admit: 2013-09-01 | Discharge: 2013-09-05 | DRG: 689 | Disposition: A | Discharge status: Home / Self Care | Payer: Medicare Other | Attending: Internal Medicine | Admitting: Internal Medicine

## 2013-09-01 DIAGNOSIS — G92 Toxic encephalopathy: Secondary | ICD-10-CM | POA: Diagnosis present

## 2013-09-01 DIAGNOSIS — B961 Klebsiella pneumoniae [K. pneumoniae] as the cause of diseases classified elsewhere: Secondary | ICD-10-CM | POA: Diagnosis present

## 2013-09-01 DIAGNOSIS — Z7401 Bed confinement status: Secondary | ICD-10-CM

## 2013-09-01 DIAGNOSIS — Z9089 Acquired absence of other organs: Secondary | ICD-10-CM

## 2013-09-01 DIAGNOSIS — R7881 Bacteremia: Secondary | ICD-10-CM

## 2013-09-01 DIAGNOSIS — K219 Gastro-esophageal reflux disease without esophagitis: Secondary | ICD-10-CM | POA: Diagnosis present

## 2013-09-01 DIAGNOSIS — E039 Hypothyroidism, unspecified: Secondary | ICD-10-CM | POA: Diagnosis present

## 2013-09-01 DIAGNOSIS — A419 Sepsis, unspecified organism: Secondary | ICD-10-CM

## 2013-09-01 DIAGNOSIS — H5440 Blindness, one eye, unspecified eye: Secondary | ICD-10-CM

## 2013-09-01 DIAGNOSIS — Z87440 Personal history of urinary (tract) infections: Secondary | ICD-10-CM

## 2013-09-01 DIAGNOSIS — Z7982 Long term (current) use of aspirin: Secondary | ICD-10-CM

## 2013-09-01 DIAGNOSIS — Z79899 Other long term (current) drug therapy: Secondary | ICD-10-CM

## 2013-09-01 DIAGNOSIS — E669 Obesity, unspecified: Secondary | ICD-10-CM | POA: Diagnosis present

## 2013-09-01 DIAGNOSIS — E119 Type 2 diabetes mellitus without complications: Secondary | ICD-10-CM | POA: Diagnosis present

## 2013-09-01 DIAGNOSIS — G35D Multiple sclerosis, unspecified: Secondary | ICD-10-CM | POA: Diagnosis present

## 2013-09-01 DIAGNOSIS — G929 Unspecified toxic encephalopathy: Secondary | ICD-10-CM | POA: Diagnosis present

## 2013-09-01 DIAGNOSIS — T502X5A Adverse effect of carbonic-anhydrase inhibitors, benzothiadiazides and other diuretics, initial encounter: Secondary | ICD-10-CM | POA: Diagnosis present

## 2013-09-01 DIAGNOSIS — Z880 Allergy status to penicillin: Secondary | ICD-10-CM

## 2013-09-01 DIAGNOSIS — N319 Neuromuscular dysfunction of bladder, unspecified: Secondary | ICD-10-CM | POA: Diagnosis present

## 2013-09-01 DIAGNOSIS — N390 Urinary tract infection, site not specified: Principal | ICD-10-CM | POA: Diagnosis present

## 2013-09-01 DIAGNOSIS — E785 Hyperlipidemia, unspecified: Secondary | ICD-10-CM | POA: Diagnosis present

## 2013-09-01 DIAGNOSIS — L8992 Pressure ulcer of unspecified site, stage 2: Secondary | ICD-10-CM | POA: Diagnosis present

## 2013-09-01 DIAGNOSIS — F411 Generalized anxiety disorder: Secondary | ICD-10-CM | POA: Diagnosis present

## 2013-09-01 DIAGNOSIS — Z91013 Allergy to seafood: Secondary | ICD-10-CM

## 2013-09-01 DIAGNOSIS — L89152 Pressure ulcer of sacral region, stage 2: Secondary | ICD-10-CM | POA: Diagnosis present

## 2013-09-01 DIAGNOSIS — L89109 Pressure ulcer of unspecified part of back, unspecified stage: Secondary | ICD-10-CM | POA: Diagnosis present

## 2013-09-01 DIAGNOSIS — E876 Hypokalemia: Secondary | ICD-10-CM | POA: Diagnosis present

## 2013-09-01 DIAGNOSIS — R410 Disorientation, unspecified: Secondary | ICD-10-CM

## 2013-09-01 DIAGNOSIS — Z6829 Body mass index (BMI) 29.0-29.9, adult: Secondary | ICD-10-CM

## 2013-09-01 DIAGNOSIS — G35 Multiple sclerosis: Secondary | ICD-10-CM

## 2013-09-01 DIAGNOSIS — A498 Other bacterial infections of unspecified site: Secondary | ICD-10-CM | POA: Diagnosis present

## 2013-09-01 DIAGNOSIS — E860 Dehydration: Secondary | ICD-10-CM | POA: Diagnosis present

## 2013-09-01 DIAGNOSIS — Z881 Allergy status to other antibiotic agents status: Secondary | ICD-10-CM

## 2013-09-01 DIAGNOSIS — R4182 Altered mental status, unspecified: Secondary | ICD-10-CM

## 2013-09-01 DIAGNOSIS — Z66 Do not resuscitate: Secondary | ICD-10-CM | POA: Diagnosis present

## 2013-09-01 HISTORY — DX: Type 2 diabetes mellitus without complications: E11.9

## 2013-09-01 HISTORY — DX: Bed confinement status: Z74.01

## 2013-09-01 HISTORY — DX: Pure hypercholesterolemia, unspecified: E78.00

## 2013-09-01 LAB — CREATININE, SERUM
CREATININE: 0.37 mg/dL — AB (ref 0.50–1.10)
GFR calc Af Amer: >90 mL/min (ref >90)
GFR calc non Af Amer: >90 mL/min (ref >90)

## 2013-09-01 LAB — BASIC METABOLIC PANEL
BUN: 10 mg/dL (ref 6–23)
CHLORIDE: 106 meq/L (ref 96–112)
CO2: 27 meq/L (ref 19–32)
Calcium: 9.3 mg/dL (ref 8.4–10.5)
Creatinine, Ser: 0.5 mg/dL (ref 0.50–1.10)
GFR calc non Af Amer: >90 mL/min (ref >90)
Glucose, Bld: 115 mg/dL — ABNORMAL HIGH (ref 70–99)
Potassium: 3.2 mEq/L — ABNORMAL LOW (ref 3.7–5.3)
Sodium: 146 mEq/L (ref 137–147)

## 2013-09-01 LAB — GLUCOSE, CAPILLARY
Glucose-Capillary: 115 mg/dL — ABNORMAL HIGH (ref 70–99)
Glucose-Capillary: 110 mg/dL — ABNORMAL HIGH (ref 70–99)

## 2013-09-01 LAB — CBC
HCT: 43.5 % (ref 36.0–46.0)
HCT: 39.9 % (ref 36.0–46.0)
HEMOGLOBIN: 13.5 g/dL (ref 12.0–15.0)
Hemoglobin: 14.2 g/dL (ref 12.0–15.0)
MCH: 31.1 pg (ref 26.0–34.0)
MCH: 31.6 pg (ref 26.0–34.0)
MCHC: 32.6 g/dL (ref 30.0–36.0)
MCHC: 33.8 g/dL (ref 30.0–36.0)
MCV: 95.4 fL (ref 78.0–100.0)
MCV: 93.4 fL (ref 78.0–100.0)
PLATELETS: 357 10*3/uL (ref 150–400)
Platelets: 353 10*3/uL (ref 150–400)
RBC: 4.56 MIL/uL (ref 3.87–5.11)
RBC: 4.27 MIL/uL (ref 3.87–5.11)
RDW: 14.8 % (ref 11.5–15.5)
RDW: 14.7 % (ref 11.5–15.5)
WBC: 17 10*3/uL — AB (ref 4.0–10.5)
WBC: 16.3 10*3/uL — AB (ref 4.0–10.5)

## 2013-09-01 LAB — URINALYSIS, ROUTINE W REFLEX MICROSCOPIC
Bilirubin Urine: NEGATIVE
Glucose, UA: NEGATIVE mg/dL
Ketones, ur: NEGATIVE mg/dL
Nitrite: POSITIVE — AB
Protein, ur: NEGATIVE mg/dL
Specific Gravity, Urine: 1.017 (ref 1.005–1.030)
UROBILINOGEN UA: 1 mg/dL (ref 0.0–1.0)
pH: 6 (ref 5.0–8.0)

## 2013-09-01 LAB — URINE MICROSCOPIC-ADD ON

## 2013-09-01 LAB — CBG MONITORING, ED: GLUCOSE-CAPILLARY: 112 mg/dL — AB (ref 70–99)

## 2013-09-01 MED ORDER — ONDANSETRON HCL 4 MG PO TABS: 4.0000 mg | ORAL_TABLET | Freq: Four times a day (QID) | ORAL | Status: DC | PRN

## 2013-09-01 MED ORDER — ONDANSETRON HCL 4 MG/2ML IJ SOLN: 4.0000 mg | Freq: Four times a day (QID) | INTRAMUSCULAR | Status: DC | PRN

## 2013-09-01 MED ORDER — CEFTRIAXONE SODIUM 1 G IJ SOLR
INTRAMUSCULAR | Status: AC
  Filled 2013-09-01: qty 10

## 2013-09-01 MED ORDER — ACETAMINOPHEN 325 MG PO TABS
650.0000 mg | ORAL_TABLET | Freq: Four times a day (QID) | ORAL | Status: DC | PRN
  Administered 2013-09-04: 650 mg via ORAL
  Filled 2013-09-01: qty 2

## 2013-09-01 MED ORDER — ALPRAZOLAM 0.5 MG PO TABS: 0.5000 mg | ORAL_TABLET | Freq: Every evening | ORAL | Status: DC | PRN

## 2013-09-01 MED ORDER — POTASSIUM CHLORIDE IN NACL 40-0.9 MEQ/L-% IV SOLN
INTRAVENOUS | Status: DC
  Administered 2013-09-01: 125 mL/h via INTRAVENOUS
  Administered 2013-09-02 – 2013-09-04 (×4): via INTRAVENOUS
  Administered 2013-09-04: 10 mL/h via INTRAVENOUS
  Filled 2013-09-01 (×8): qty 1000

## 2013-09-01 MED ORDER — INSULIN ASPART 100 UNIT/ML ~~LOC~~ SOLN: 0.0000 [IU] | Freq: Three times a day (TID) | SUBCUTANEOUS | Status: DC

## 2013-09-01 MED ORDER — LEVOTHYROXINE SODIUM 125 MCG PO TABS
125.0000 ug | ORAL_TABLET | Freq: Every day | ORAL | Status: DC
  Administered 2013-09-02 – 2013-09-05 (×4): 125 ug via ORAL
  Filled 2013-09-01 (×5): qty 1

## 2013-09-01 MED ORDER — DOCUSATE SODIUM 100 MG PO CAPS
100.0000 mg | ORAL_CAPSULE | Freq: Two times a day (BID) | ORAL | Status: DC
  Administered 2013-09-01 – 2013-09-05 (×6): 100 mg via ORAL
  Filled 2013-09-01 (×10): qty 1

## 2013-09-01 MED ORDER — DEXTROSE 5 % IV SOLN
1.0000 g | INTRAVENOUS | Status: DC
  Administered 2013-09-02 – 2013-09-04 (×3): 1 g via INTRAVENOUS
  Filled 2013-09-01 (×4): qty 10

## 2013-09-01 MED ORDER — DEXTROSE 5 % IV SOLN
1.0000 g | Freq: Once | INTRAVENOUS | Status: AC
  Administered 2013-09-01: 1 g via INTRAVENOUS

## 2013-09-01 MED ORDER — BISACODYL 5 MG PO TBEC
5.0000 mg | DELAYED_RELEASE_TABLET | Freq: Every day | ORAL | Status: DC | PRN
  Filled 2013-09-01: qty 1

## 2013-09-01 MED ORDER — ENOXAPARIN SODIUM 40 MG/0.4ML ~~LOC~~ SOLN
40.0000 mg | SUBCUTANEOUS | Status: DC
  Administered 2013-09-01 – 2013-09-04 (×4): 40 mg via SUBCUTANEOUS
  Filled 2013-09-01 (×6): qty 0.4

## 2013-09-01 MED ORDER — SODIUM CHLORIDE 0.9 % IJ SOLN
3.0000 mL | Freq: Two times a day (BID) | INTRAMUSCULAR | Status: DC
  Administered 2013-09-03 – 2013-09-05 (×3): 3 mL via INTRAVENOUS

## 2013-09-01 MED ORDER — MAGNESIUM HYDROXIDE 400 MG/5ML PO SUSP: 30.0000 mL | Freq: Every day | ORAL | Status: DC | PRN

## 2013-09-01 MED ORDER — DEXTROSE 5 % IV SOLN
1.0000 g | INTRAVENOUS | Status: DC
  Filled 2013-09-01: qty 10

## 2013-09-01 MED ORDER — SODIUM CHLORIDE 0.9 % IV BOLUS (SEPSIS)
1000.0000 mL | Freq: Once | INTRAVENOUS | Status: AC
Start: 2013-09-01 — End: 2013-09-01
  Administered 2013-09-01: 1000 mL via INTRAVENOUS

## 2013-09-01 MED ORDER — BISACODYL 10 MG RE SUPP: 10.0000 mg | Freq: Every day | RECTAL | Status: DC | PRN

## 2013-09-01 NOTE — ED Provider Notes (Signed)
CSN: [id number]     Arrival date & time 09/01/13  1105 History   First MD Initiated Contact with Patient 09/01/13 1115     Chief Complaint  Patient presents with  . alterted level of consciousness    HPI Caregiver reports yesterday pt was more fatigued and tired. Not taking good PO. She has had a rash on face 1 week ago, has been given benadryl for rash over the last week. This morning the caregiver found the pt leaning over to her right side and her mouth was drawn to the right. Patient has no history of TIA or stroke.  Patient is reportedly on prophylactic abx for frequent bladder infections. Caregiver states she had a UTI recently, with similar symptoms. Patient is bedridden d/t MS, she is unable to move from legs down. At baseline she is fully functional and watches TV, feeds herself and is active on computer. She has 24 hour care in home. Patient is repeatedly stating, "no, it does not hurt". No fever.   Past Medical History  Diagnosis Date  . Urinary incontinence     neurogenic bladder--hx of uti's  . Thyroid disease   . Multiple sclerosis   . GERD (gastroesophageal reflux disease)   . Hypothyroidism   . Anxiety   . Blindness of right eye     complication of ms  . Arthritis     old falls-with injuries to rt knee and both ankles and rt wrist--states she has some pain in these areas-probable arthritis  . Cellulitis     hx of cellulitis in lower extremities -requiring multiple hospitalzations in the past--no problems in las 2 yrs  . Neuromuscular disorder     ms-not ambulatory-muscle spasms-and recent tremors left arm and left  leg have developed--dr. Daphane Shepherd in [place] is pt's neurologist  . Diabetes mellitus without complication    Past Surgical History  Procedure Laterality Date  . Cholecystectomy    . Tonsillectomy    . Ankle surgery    . Wrist surgery    . Lymph gland excision    . Mediastinoscopy  04/24/10    for enlarged right paratracheal lymph node--surgery at  [facility]-Dr. Burney--pt states she does not know the  what the pathology report showed.  . Adenoidectomy    . Fracture surgery  2005    orif left ankle fx  . Cystoscopy  07/02/2011    Procedure: CYSTOSCOPY;  Surgeon: Kathi Ludwig, MD;  Location: [facility];  Service: Urology;  Laterality: N/A;   No family history on file. History  Substance Use Topics  . Smoking status: Never Smoker   . Smokeless tobacco: Never Used  . Alcohol Use: No   OB History   Grav Para Term Preterm Abortions TAB SAB Ect Mult Living                 Review of Systems  Constitutional: Positive for activity change, appetite change and fatigue. Negative for fever, chills and diaphoresis.   Allergies  Avelox; Ciprofloxacin hcl; Penicillins; and Shrimp  Home Medications   Current Outpatient Rx  Name  Route  Sig  Dispense  Refill  . acetaminophen (TYLENOL) 500 MG tablet   Oral   Take 1,000 mg by mouth at bedtime as needed for mild pain.         Marland Kitchen ALPRAZolam (XANAX) 0.5 MG tablet   Oral   Take 0.5 mg by mouth at bedtime as needed for anxiety.         Marland Kitchen  aspirin EC 81 MG tablet   Oral   Take 81 mg by mouth daily.         Marland Kitchen atorvastatin (LIPITOR) 20 MG tablet   Oral   Take 20 mg by mouth at bedtime.         . bisacodyl (BISACODYL) 5 MG EC tablet   Oral   Take 1 tablet (5 mg total) by mouth daily as needed for moderate constipation.   30 tablet   0   . cefUROXime (CEFTIN) 500 MG tablet   Oral   Take 1 tablet (500 mg total) by mouth 2 (two) times daily with a meal.   10 tablet   0   . CRANBERRY PO   Oral   Take 1 tablet by mouth daily as needed (for urinary pain).          Marland Kitchen darifenacin (ENABLEX) 7.5 MG 24 hr tablet   Oral   Take 7.5 mg by mouth 2 (two) times daily.         . diphenhydrAMINE (BENADRYL) 25 MG tablet   Oral   Take 50 mg by mouth every 6 (six) hours as needed for itching.         . docusate sodium (COLACE) 100 MG capsule   Oral   Take 1 capsule (100 mg total)  by mouth 2 (two) times daily.   20 capsule   0   . esomeprazole (NEXIUM) 40 MG capsule   Oral   Take 40 mg by mouth every morning.          . furosemide (LASIX) 20 MG tablet   Oral   Take 20 mg by mouth 2 (two) times daily.         Marland Kitchen gabapentin (NEURONTIN) 400 MG capsule   Oral   Take 800 mg by mouth 2 (two) times daily.         Marland Kitchen levothyroxine (SYNTHROID) 125 MCG tablet   Oral   Take 1 tablet (125 mcg total) by mouth daily before breakfast.   30 tablet   0   . metFORMIN (GLUCOPHAGE) 500 MG tablet   Oral   Take 250 mg by mouth daily.          . mineral oil-hydrophilic petrolatum (AQUAPHOR) ointment   Topical   Apply 1 application topically as needed for dry skin.         . Multiple Vitamin (MULTIVITAMIN WITH MINERALS) TABS tablet   Oral   Take 1 tablet by mouth daily.         Marland Kitchen nystatin (MYCOSTATIN/NYSTOP) 100000 UNIT/GM POWD   Topical   Apply 1 Bottle topically 2 (two) times daily. To perineal/groin area and right great toe as needed for yeast         . nystatin cream (MYCOSTATIN)   Topical   Apply 1 application topically as needed for dry skin.         . polyethylene glycol (MIRALAX / GLYCOLAX) packet   Oral   Take 17 g by mouth daily.         . QUEtiapine (SEROQUEL) 25 MG tablet   Oral   Take 25 mg by mouth at bedtime.         . simethicone (MYLICON) 80 MG chewable tablet   Oral   Chew 160 mg by mouth every 6 (six) hours as needed for flatulence.         Marland Kitchen tiZANidine (ZANAFLEX) 4 MG tablet   Oral   Take  2-4 mg by mouth. Up to four times per day for muscle spasms.         Marland Kitchen. trimethoprim (TRIMPEX) 100 MG tablet   Oral   Take 100 mg by mouth daily.          BP 142/62  Pulse 91  Temp(Src) 98.1 F (36.7 C) (Rectal)  Resp 22  SpO2 96% Physical Exam Gen: Altered level of mental status. Repeating same sentence. Fatigue tenderness over entire body.  HEENT: AT. [place]. Bilateral eyes without injections or icterus. MMM. Left side of  face appears relaxed.  CV: RRR, No murmur appreciated.  Chest: CTAB, no wheeze or crackles Abd: Soft. NTND. BS present. No HSM.  Ext: No erythema. Or edema. Appears Non-tender.  Skin: No rashes, purpura or petechiae.  Neuro: Pupils reactive, but sluggish bilaterally. EOMi. Awake. followed command x1, but delayed. 4/5 MS to grip bilateral UE. Was not able to obtain further neuro exam d/t pts mental status.     ED Course  Procedures (including critical care time) Labs Review Labs Reviewed - No data to display Imaging Review No results found.   EKG Interpretation   Date/Time:  Tuesday September 01 2013 11:10:38 EST Ventricular Rate:  89 PR Interval:  180 QRS Duration: 88 QT Interval:  392 QTC Calculation: 476 R Axis:   28 Text Interpretation:  Normal sinus rhythm Cannot rule out Anterior infarct  , age undetermined No significant change since last tracing Confirmed by  [facility]  MD, WHITNEY ([id number]) on 09/01/2013 11:20:34 AM      MDM   Final diagnoses:  None   EKG, in comparison to old EKG appeared normal. Vitals stable. UA positive nitrites, large leuks, trace of hgb. Pt has has positive E.coli and klebs in the past. She received 1 dose of ceftriaxone and 2L IVF in ED. Urine was sent for culture. WBC elevated to 17.0. BNP with hypokalemia. Due to patients altered mental status, elevated WBC, urine results and not able to take PO [facility]. Accepting physician Dr. Gwenlyn PerkingMadera, team 10, tele bed.  Patients caregiver has requested [facility]. Patient will be transported via EMS.     Natalia Leatherwoodenee A Haddon Fyfe, DO 09/01/13 1335

## 2013-09-01 NOTE — ED Notes (Signed)
CBG 112.  

## 2013-09-01 NOTE — ED Provider Notes (Signed)
I saw and evaluated the patient, reviewed the resident's note and I agree with the findings and plan.   EKG Interpretation   Date/Time:  Tuesday September 01 2013 11:10:38 EST Ventricular Rate:  89 PR Interval:  180 QRS Duration: 88 QT Interval:  392 QTC Calculation: 476 R Axis:   28 Text Interpretation:  Normal sinus rhythm Cannot rule out Anterior infarct  , age undetermined No significant change since last tracing Confirmed by  [facility]  MD, Alphonzo Lemmings ([id number]) on 09/01/2013 11:20:34 AM      Patient presents with altered mental status, decreased by mouth intake and evidence of UTI in exam. Patient's is awake but answers questions inconsistently. She has no focal tenderness. There is no sign of renal compromise but given patient's altered status from her baseline feel she would benefit from IV antibiotics and observation.  I have reviewed EKG and agree with the resident interpretation.   Gwyneth Sprout, MD 09/01/13 [phone]

## 2013-09-01 NOTE — ED Notes (Signed)
Started feeling bad last night this morning noted a change in mental status had same presentation with a uti last month and took a round of antibiotics

## 2013-09-01 NOTE — H&P (Signed)
[facility] History and Physical  Ashley CroakCynthia A Stanley WUJ:[id number] DOB: [birth year] DOA: 09/01/2013  Referring physician: EDP PCP: [name], [name], MD   Chief Complaint: confusion  HPI: [name] is a 63 y.o. female  With MS, bedridden, recurrent UTI.  [facility] with altered mental status. Currently, no family available, so hx is per chart and EDP.  Workup showed UTI, hypokalemia.  Given ceftriaxone.    Review of Systems:  Unable due to patient factors.  Past Medical History  Diagnosis Date  . Urinary incontinence     neurogenic bladder--hx of uti's  . Thyroid disease   . Multiple sclerosis   . GERD (gastroesophageal reflux disease)   . Hypothyroidism   . Anxiety   . Blindness of right eye     complication of ms  . Arthritis     old falls-with injuries to rt knee and both ankles and rt wrist--states she has some pain in these areas-probable arthritis  . Cellulitis     hx of cellulitis in lower extremities -requiring multiple hospitalzations in the past--no problems in las 2 yrs  . Neuromuscular disorder     ms-not ambulatory-muscle spasms-and recent tremors left arm and left  leg have developed--dr. Daphane ShepherdMeyer in [place] is pt's neurologist  . Diabetes mellitus without complication    Past Surgical History  Procedure Laterality Date  . Cholecystectomy    . Tonsillectomy    . Ankle surgery    . Wrist surgery    . Lymph gland excision    . Mediastinoscopy  04/24/10    for enlarged right paratracheal lymph node--surgery at [facility]-Dr. Burney--pt states she does not know the  what the pathology report showed.  . Adenoidectomy    . Fracture surgery  2005    orif left ankle fx  . Cystoscopy  07/02/2011    Procedure: CYSTOSCOPY;  Surgeon: Kathi LudwigSigmund I Tannenbaum, MD;  Location: [facility];  Service: Urology;  Laterality: N/A;   Social History:  reports that she has never smoked. She has never used smokeless tobacco. She reports that she does not drink alcohol or use  illicit drugs.  Allergies  Allergen Reactions  . Avelox [Moxifloxacin Hcl In Nacl] Hives and Other (See Comments)    Panic attack  . Ciprofloxacin Hcl Hives and Other (See Comments)    Panic attack  . Penicillins Hives  . Shrimp [Shellfish Allergy] Other (See Comments)    flushing   FH:  Unable due to patient factors.   Prior to Admission medications   Medication Sig Start Date End Date Taking? Authorizing Provider  acetaminophen (TYLENOL) 500 MG tablet Take 1,000 mg by mouth at bedtime as needed for mild pain.    Historical Provider, MD  ALPRAZolam Prudy Feeler(XANAX) 0.5 MG tablet Take 0.5 mg by mouth at bedtime as needed for anxiety.    Historical Provider, MD  aspirin EC 81 MG tablet Take 81 mg by mouth daily.    Historical Provider, MD  atorvastatin (LIPITOR) 20 MG tablet Take 20 mg by mouth at bedtime.    Historical Provider, MD  bisacodyl (BISACODYL) 5 MG EC tablet Take 1 tablet (5 mg total) by mouth daily as needed for moderate constipation. 05/11/13   Jeralyn BennettEzequiel Zamora, MD  cefUROXime (CEFTIN) 500 MG tablet Take 1 tablet (500 mg total) by mouth 2 (two) times daily with a meal. 07/25/13   Leroy SeaPrashant K Singh, MD  CRANBERRY PO Take 1 tablet by mouth daily as needed (for urinary pain).  Historical Provider, MD  darifenacin (ENABLEX) 7.5 MG 24 hr tablet Take 7.5 mg by mouth 2 (two) times daily.    Historical Provider, MD  diphenhydrAMINE (BENADRYL) 25 MG tablet Take 50 mg by mouth every 6 (six) hours as needed for itching.    Historical Provider, MD  docusate sodium (COLACE) 100 MG capsule Take 1 capsule (100 mg total) by mouth 2 (two) times daily. 05/11/13   Jeralyn Bennett, MD  esomeprazole (NEXIUM) 40 MG capsule Take 40 mg by mouth every morning.     Historical Provider, MD  furosemide (LASIX) 20 MG tablet Take 20 mg by mouth 2 (two) times daily.    Historical Provider, MD  gabapentin (NEURONTIN) 400 MG capsule Take 800 mg by mouth 2 (two) times daily.    Historical Provider, MD  levothyroxine  (SYNTHROID) 125 MCG tablet Take 1 tablet (125 mcg total) by mouth daily before breakfast. 05/11/13   Jeralyn Bennett, MD  metFORMIN (GLUCOPHAGE) 500 MG tablet Take 250 mg by mouth daily.     Historical Provider, MD  mineral oil-hydrophilic petrolatum (AQUAPHOR) ointment Apply 1 application topically as needed for dry skin.    Historical Provider, MD  Multiple Vitamin (MULTIVITAMIN WITH MINERALS) TABS tablet Take 1 tablet by mouth daily.    Historical Provider, MD  nystatin (MYCOSTATIN/NYSTOP) 100000 UNIT/GM POWD Apply 1 Bottle topically 2 (two) times daily. To perineal/groin area and right great toe as needed for yeast    Historical Provider, MD  nystatin cream (MYCOSTATIN) Apply 1 application topically as needed for dry skin.    Historical Provider, MD  polyethylene glycol (MIRALAX / GLYCOLAX) packet Take 17 g by mouth daily.    Historical Provider, MD  QUEtiapine (SEROQUEL) 25 MG tablet Take 25 mg by mouth at bedtime.    Historical Provider, MD  simethicone (MYLICON) 80 MG chewable tablet Chew 160 mg by mouth every 6 (six) hours as needed for flatulence.    Historical Provider, MD  tiZANidine (ZANAFLEX) 4 MG tablet Take 2-4 mg by mouth. Up to four times per day for muscle spasms.    Historical Provider, MD  trimethoprim (TRIMPEX) 100 MG tablet Take 100 mg by mouth daily.    Historical Provider, MD   Physical Exam: Filed Vitals:   09/01/13 1514  BP: 150/74  Pulse:   Temp: 98.5 F (36.9 C)  Resp:     BP 150/74  Pulse 91  Temp(Src) 98.5 F (36.9 C) (Oral)  Resp 22  SpO2 96% BP 141/73  Pulse 103  Temp(Src) 99 F (37.2 C) (Oral)  Resp 16  Wt 74.208 kg (163 lb 9.6 oz)  SpO2 95%  General Appearance:    Alert, obese, answers all questions "[employer]"  Head:    Normocephalic, without obvious abnormality, atraumatic  Eyes:    PERRL, conjunctiva/corneas clear, EOM's intact, fundi    benign, both eyes     Nose:   Nares normal, septum midline, mucosa normal, no drainage    or sinus  tenderness  Throat:   Extremely dry MM  Neck:   Supple, symmetrical, trachea midline, no adenopathy;    thyroid:  no enlargement/tenderness/nodules; no carotid   bruit or JVD     Lungs:     Clear to auscultation bilaterally, respirations unlabored  Chest Wall:    No tenderness or deformity   Heart:    Regular rate and rhythm, S1 and S2 normal, no murmur, rub   or gallop     Abdomen:  Obese, s, nt, nd  Genitalia:   deferred  Rectal:   deferred  Extremities:   Extremities normal, atraumatic, no cyanosis or 2+ edema  Pulses:   2+ and symmetric all extremities  Skin:   Skin color, texture, turgor normal, no rashes or lesions  Lymph nodes:   Cervical, supraclavicular, and axillary nodes normal  Neurologic:   Legs weak. Wont follow commands or speak coherently           Psych: no agitation  Labs on Admission:  Basic Metabolic Panel:  Recent Labs Lab 09/01/13 1225  NA 146  K 3.2*  CL 106  CO2 27  GLUCOSE 115*  BUN 10  CREATININE 0.50  CALCIUM 9.3   Liver Function Tests: No results found for this basename: AST, ALT, ALKPHOS, BILITOT, PROT, ALBUMIN,  in the last 168 hours No results found for this basename: LIPASE, AMYLASE,  in the last 168 hours No results found for this basename: AMMONIA,  in the last 168 hours CBC:  Recent Labs Lab 09/01/13 1225  WBC 17.0*  HGB 14.2  HCT 43.5  MCV 95.4  PLT 353   Cardiac Enzymes: No results found for this basename: CKTOTAL, CKMB, CKMBINDEX, TROPONINI,  in the last 168 hours  BNP (last 3 results) No results found for this basename: PROBNP,  in the last 8760 hours CBG:  Recent Labs Lab 09/01/13 1355  GLUCAP 112*   Assessment/Plan Principal Problem:   UTI, recurrent. Previous 2 urine cx show klebsiella and E coli. Continue rocephin. F/u cultures.  Check renal US r/o stone or perinephric abscess Active Problems:   Encephalopathy, toxic   Dehydration: does not appear pt received any IVF in ED. Will bolus a liter NS.  Continue IVF   Multiple sclerosis: bedridden. Per RN, stage 2 sacral decub   GERD (gastroesophageal reflux disease)    Obesity, unspecified   DM type 2 (diabetes mellitus, type 2): SSI   Other and unspecified hyperlipidemia   Unspecified hypothyroidism: check TSH   Hypokalemia: replete IV  Code Status: DNR Family Communication:  Disposition Plan: home  Time spent: 60 min  Makeisha Jentsch L [facility] Pager [phone]

## 2013-09-01 NOTE — Progress Notes (Signed)
Called from Dr. Tanna Savoy at [facility] ED, to admit patient [name] due to UTI, acute toxic encephalopathy, leukocytosis and unable to keep anything down. Also found to have hypokalemia. VS stable otherwise. Accepted to telemetry due to hypokalemia. Patient started on rocephin.  [facility].  [name] [phone]

## 2013-09-02 ENCOUNTER — Inpatient Hospital Stay (HOSPITAL_COMMUNITY): Payer: Medicare Other

## 2013-09-02 ENCOUNTER — Encounter (HOSPITAL_COMMUNITY): Payer: Self-pay | Admitting: Internal Medicine

## 2013-09-02 DIAGNOSIS — G92 Toxic encephalopathy: Secondary | ICD-10-CM

## 2013-09-02 DIAGNOSIS — G929 Unspecified toxic encephalopathy: Secondary | ICD-10-CM

## 2013-09-02 DIAGNOSIS — Z7401 Bed confinement status: Secondary | ICD-10-CM

## 2013-09-02 DIAGNOSIS — L89152 Pressure ulcer of sacral region, stage 2: Secondary | ICD-10-CM | POA: Diagnosis present

## 2013-09-02 HISTORY — DX: Bed confinement status: Z74.01

## 2013-09-02 LAB — CBC WITH DIFFERENTIAL/PLATELET
BASOS PCT: 0 % (ref 0–1)
Basophils Absolute: 0.1 10*3/uL (ref 0.0–0.1)
EOS PCT: 1 % (ref 0–5)
Eosinophils Absolute: 0.1 10*3/uL (ref 0.0–0.7)
HEMATOCRIT: 39.9 % (ref 36.0–46.0)
Hemoglobin: 13.3 g/dL (ref 12.0–15.0)
Lymphocytes Relative: 12 % (ref 12–46)
Lymphs Abs: 1.7 10*3/uL (ref 0.7–4.0)
MCH: 31.2 pg (ref 26.0–34.0)
MCHC: 33.3 g/dL (ref 30.0–36.0)
MCV: 93.7 fL (ref 78.0–100.0)
MONO ABS: 0.9 10*3/uL (ref 0.1–1.0)
MONOS PCT: 7 % (ref 3–12)
Neutro Abs: 11.3 10*3/uL — ABNORMAL HIGH (ref 1.7–7.7)
Neutrophils Relative %: 80 % — ABNORMAL HIGH (ref 43–77)
Platelets: 350 10*3/uL (ref 150–400)
RBC: 4.26 MIL/uL (ref 3.87–5.11)
RDW: 14.4 % (ref 11.5–15.5)
WBC: 14.1 10*3/uL — ABNORMAL HIGH (ref 4.0–10.5)

## 2013-09-02 LAB — HEMOGLOBIN A1C
HEMOGLOBIN A1C: 5.3 % (ref <5.7)
MEAN PLASMA GLUCOSE: 105 mg/dL (ref <117)

## 2013-09-02 LAB — MAGNESIUM: Magnesium: 1.7 mg/dL (ref 1.5–2.5)

## 2013-09-02 LAB — BASIC METABOLIC PANEL
BUN: 5 mg/dL — ABNORMAL LOW (ref 6–23)
CO2: 22 meq/L (ref 19–32)
CREATININE: 0.36 mg/dL — AB (ref 0.50–1.10)
Calcium: 8.4 mg/dL (ref 8.4–10.5)
Chloride: 105 mEq/L (ref 96–112)
GFR calc non Af Amer: >90 mL/min (ref >90)
Glucose, Bld: 104 mg/dL — ABNORMAL HIGH (ref 70–99)
Potassium: 3.3 mEq/L — ABNORMAL LOW (ref 3.7–5.3)
Sodium: 144 mEq/L (ref 137–147)

## 2013-09-02 LAB — GLUCOSE, CAPILLARY
GLUCOSE-CAPILLARY: 102 mg/dL — AB (ref 70–99)
Glucose-Capillary: 110 mg/dL — ABNORMAL HIGH (ref 70–99)
Glucose-Capillary: 93 mg/dL (ref 70–99)
Glucose-Capillary: 101 mg/dL — ABNORMAL HIGH (ref 70–99)

## 2013-09-02 LAB — TSH
TSH: 0.336 u[IU]/mL — ABNORMAL LOW (ref 0.350–4.500)
TSH: 0.324 u[IU]/mL — ABNORMAL LOW (ref 0.350–4.500)

## 2013-09-02 MED ORDER — ENSURE COMPLETE PO LIQD
237.0000 mL | Freq: Two times a day (BID) | ORAL | Status: DC
  Administered 2013-09-03 – 2013-09-05 (×5): 237 mL via ORAL

## 2013-09-02 MED ORDER — MAGNESIUM SULFATE 50 % IJ SOLN
3.0000 g | Freq: Once | INTRAVENOUS | Status: AC
  Administered 2013-09-02: 3 g via INTRAVENOUS
  Filled 2013-09-02: qty 6

## 2013-09-02 MED ORDER — POTASSIUM CHLORIDE CRYS ER 20 MEQ PO TBCR: 40.0000 meq | EXTENDED_RELEASE_TABLET | ORAL | Status: DC

## 2013-09-02 MED ORDER — POTASSIUM CHLORIDE CRYS ER 10 MEQ PO TBCR
40.0000 meq | EXTENDED_RELEASE_TABLET | ORAL | Status: AC
  Administered 2013-09-02 (×2): 40 meq via ORAL
  Filled 2013-09-02 (×2): qty 4

## 2013-09-02 NOTE — Progress Notes (Addendum)
[facility] PROGRESS NOTE  Ashley CroakCynthia A Stanley ZOX:[id number] DOB: 1951-03-22 DOA: 09/01/2013 PCP: [name], [name], MD  Assessment/Plan: #1 recurrent urinary tract infection Urine cultures pending. Renal ultrasound negative. Continue empiric IV Rocephin. Follow.  #2 toxic encephalopathy/metabolic encephalopathy Likely secondary to problem #1. Clinical improvement. Continue IV antibiotics as stated in problem #1.  #3 hypokalemia Replete.  #4 dehydration IV fluids.  #5 gastroesophageal reflux disease PPI.  #6 multiple sclerosis Bedridden. Stable.  #7 hypothyroidism Check a TSH. Continue Synthroid.  #8 diabetes mellitus type 2 Check a hemoglobin A1c. CBG ranging from 110 -115. SSI  #9 sacral decubitus stage II/bedridden bedbound Continue current wound care. Consult wound care nurse.  #10 prophylaxis PPI for GI prophylaxis. Lovenox for DVT prophylaxis.  Code Status: Full Family Communication: Updated patient no family present. Disposition Plan: Home when medically stable.   Consultants:  None  Procedures:  Renal US 09/02/13  Antibiotics:  IV Rocephin 09/01/13  HPI/Subjective: Patient alert. Patient following commands. Patient states she's feeling a lot better. Patient asking for regular food. Per nursing tech patient with significant improvement since admission.  Objective: Filed Vitals:   09/02/13 0445  BP: 137/66  Pulse: 92  Temp: 98.1 F (36.7 C)  Resp:     Intake/Output Summary (Last 24 hours) at 09/02/13 0851 Last data filed at 09/01/13 1700  Gross per 24 hour  Intake     60 ml  Output      0 ml  Net     60 ml   Filed Weights   09/01/13 1853 09/02/13 0445  Weight: 74.208 kg (163 lb 9.6 oz) 73.483 kg (162 lb)    Exam:   General:  nad  Cardiovascular: rrr  Respiratory: ctab  Abdomen: soft/NT/ND/+BS  Musculoskeletal:No c/c/e  Data Reviewed: Basic Metabolic Panel:  Recent Labs Lab 09/01/13 1225 09/01/13 1832 09/02/13 0417  NA  146  --  144  K 3.2*  --  3.3*  CL 106  --  105  CO2 27  --  22  GLUCOSE 115*  --  104*  BUN 10  --  5*  CREATININE 0.50 0.37* 0.36*  CALCIUM 9.3  --  8.4  MG  --   --  1.7   Liver Function Tests: No results found for this basename: AST, ALT, ALKPHOS, BILITOT, PROT, ALBUMIN,  in the last 168 hours No results found for this basename: LIPASE, AMYLASE,  in the last 168 hours No results found for this basename: AMMONIA,  in the last 168 hours CBC:  Recent Labs Lab 09/01/13 1225 09/01/13 1832 09/02/13 0417  WBC 17.0* 16.3* 14.1*  NEUTROABS  --   --  11.3*  HGB 14.2 13.5 13.3  HCT 43.5 39.9 39.9  MCV 95.4 93.4 93.7  PLT 353 357 350   Cardiac Enzymes: No results found for this basename: CKTOTAL, CKMB, CKMBINDEX, TROPONINI,  in the last 168 hours BNP (last 3 results) No results found for this basename: PROBNP,  in the last 8760 hours CBG:  Recent Labs Lab 09/01/13 1355 09/01/13 1700 09/01/13 2018  GLUCAP 112* 115* 110*    No results found for this or any previous visit (from the past 240 hour(s)).   Studies: No results found.  Scheduled Meds: . cefTRIAXone (ROCEPHIN)  IV  1 g Intravenous Q24H  . docusate sodium  100 mg Oral BID  . enoxaparin (LOVENOX) injection  40 mg Subcutaneous Q24H  . insulin aspart  0-9 Units Subcutaneous TID WC  . levothyroxine  125 mcg  Oral QAC breakfast  . magnesium sulfate 1 - 4 g bolus IVPB  3 g Intravenous Once  . potassium chloride  40 mEq Oral Q4H  . sodium chloride  3 mL Intravenous Q12H   Continuous Infusions: . 0.9 % NaCl with KCl 40 mEq / L 125 mL/hr (09/01/13 1835)    Principal Problem:   UTI Active Problems:   Multiple sclerosis   GERD (gastroesophageal reflux disease)   Encephalopathy, toxic   Dehydration   Obesity, unspecified   DM type 2 (diabetes mellitus, type 2)   Other and unspecified hyperlipidemia   Unspecified hypothyroidism   Hypokalemia    Time spent: 35 mins    [facility] MD [facility] Pager [phone]. If 7PM-7AM, please contact night-coverage at [email or web address], password [email or web address] 09/02/2013, 8:51 AM  LOS: 1 day

## 2013-09-02 NOTE — Clinical Documentation Improvement (Signed)
  Registered Dietician on in 3/4 evaluation documented "Stage II sacral decubitus". If you agree, please add to Notes and DC summary to illustrate this bed bound patient's severity of illness and risk of mortality. Thank you.  Thank You, Beverley Fiedler ,RN Clinical Documentation Specialist:  [phone]  [facility]- Health Information Management

## 2013-09-02 NOTE — Progress Notes (Signed)
INITIAL NUTRITION ASSESSMENT  DOCUMENTATION CODES Per approved criteria  -Not Applicable   INTERVENTION: Please require assistance with feeding. Add Ensure Complete po BID, each supplement provides 350 kcal and 13 grams of protein. RD to continue to follow nutrition care plan.  NUTRITION DIAGNOSIS: Increased nutrient needs related to wound healing as evidenced by estimated needs.   Goal: Intake to meet >90% of estimated nutrition needs.  Monitor:  weight trends, lab trends, I/O's, PO intake, supplement tolerance  Reason for Assessment: Low Braden Score  63 y.o. female  Admitting Dx: Urinary tract infection, site not specified  ASSESSMENT: PMHx significant for MS, bedridden, recurrent UTI. Admitted with UTI and hypokalemia. Work-up reveals recurrent UTI.  Pt is currently ordered for a Regular diet. Pt ate 0% of breakfast this morning.  Pt with 27% wt loss x 4 months - this is significant for this time frame. She confirms that she has had weight loss. Cannot tell this RD if it was intentional or not. Per chart, pt is unable to move her legs 2/2 MS. Pt requiring assistance with feeding presently, but at her baseline can feed herself.  Per charts, pt was not eating well PTA. Unable to quantify how much pt was eating PTA. Pt with profound weight loss and is at nutrition risk 2/2 this weight loss, pressure ulcer, and limited oral intake.  Potassium is low at 3.3, but trending up Magnesium WNL at 1.7; ordered for IV magnesium sulfate Ordered for NS with KCl at 125 ml/hr  Height: Ht Readings from Last 1 Encounters:  05/09/13 5\' 2"  (1.575 m)    Weight: Wt Readings from Last 1 Encounters:  09/02/13 162 lb (73.483 kg)    Ideal Body Weight: 110 lb  % Ideal Body Weight: 147%  Wt Readings from Last 10 Encounters:  09/02/13 162 lb (73.483 kg)  07/24/13 161 lb 13.1 oz (73.4 kg)  05/11/13 221 lb 6.4 oz (100.426 kg)  03/07/12 240 lb (108.863 kg)  02/06/12 234 lb (106.142 kg)   07/02/11 209 lb (94.802 kg)  07/02/11 209 lb (94.802 kg)    Usual Body Weight: 221 lb  % Usual Body Weight: 73%  BMI:  Body mass index is 29.62 kg/(m^2). Overweight  Estimated Nutritional Needs: Kcal: 1400 - 1600 Protein: 70 - 85 g Fluid: 1.4 - 1.6 liters  Skin: stage II pressure ulcer on sacrum  Diet Order: General  EDUCATION NEEDS: -No education needs identified at this time   Intake/Output Summary (Last 24 hours) at 09/02/13 1058 Last data filed at 09/02/13 0911  Gross per 24 hour  Intake    420 ml  Output      0 ml  Net    420 ml    Last BM: 3/3  Labs:   Recent Labs Lab 09/01/13 1225 09/01/13 1832 09/02/13 0417  NA 146  --  144  K 3.2*  --  3.3*  CL 106  --  105  CO2 27  --  22  BUN 10  --  5*  CREATININE 0.50 0.37* 0.36*  CALCIUM 9.3  --  8.4  MG  --   --  1.7  GLUCOSE 115*  --  104*    CBG (last 3)   Recent Labs  09/01/13 1700 09/01/13 2018 09/02/13 0728  GLUCAP 115* 110* 110*    Scheduled Meds: . cefTRIAXone (ROCEPHIN)  IV  1 g Intravenous Q24H  . docusate sodium  100 mg Oral BID  . enoxaparin (LOVENOX) injection  40 mg  Subcutaneous Q24H  . insulin aspart  0-9 Units Subcutaneous TID WC  . levothyroxine  125 mcg Oral QAC breakfast  . magnesium sulfate 1 - 4 g bolus IVPB  3 g Intravenous Once  . potassium chloride  40 mEq Oral Q4H  . sodium chloride  3 mL Intravenous Q12H    Continuous Infusions: . 0.9 % NaCl with KCl 40 mEq / L 125 mL/hr (09/01/13 1835)    Past Medical History  Diagnosis Date  . Urinary incontinence     neurogenic bladder--hx of uti's  . Thyroid disease   . Multiple sclerosis   . GERD (gastroesophageal reflux disease)   . Hypothyroidism   . Anxiety   . Blindness of right eye     complication of ms  . Arthritis     old falls-with injuries to rt knee and both ankles and rt wrist--states she has some pain in these areas-probable arthritis  . Cellulitis     hx of cellulitis in lower extremities -requiring  multiple hospitalzations in the past--no problems in las 2 yrs  . Neuromuscular disorder     ms-not ambulatory-muscle spasms-and recent tremors left arm and left  leg have developed--dr. Daphane ShepherdMeyer in [place] is pt's neurologist  . Diabetes mellitus without complication     Past Surgical History  Procedure Laterality Date  . Cholecystectomy    . Tonsillectomy    . Ankle surgery    . Wrist surgery    . Lymph gland excision    . Mediastinoscopy  04/24/10    for enlarged right paratracheal lymph node--surgery at [facility]-Dr. Burney--pt states she does not know the  what the pathology report showed.  . Adenoidectomy    . Fracture surgery  2005    orif left ankle fx  . Cystoscopy  07/02/2011    Procedure: CYSTOSCOPY;  Surgeon: Kathi LudwigSigmund I Tannenbaum, MD;  Location: [facility];  Service: Urology;  Laterality: N/A;    Jarold MottoSamantha Giang Hemme MS, RD, LDN Inpatient Registered Dietitian Pager: [phone] After-hours pager: [phone]

## 2013-09-03 ENCOUNTER — Encounter (HOSPITAL_COMMUNITY): Payer: Self-pay | Admitting: General Practice

## 2013-09-03 DIAGNOSIS — L8992 Pressure ulcer of unspecified site, stage 2: Secondary | ICD-10-CM

## 2013-09-03 DIAGNOSIS — L89109 Pressure ulcer of unspecified part of back, unspecified stage: Secondary | ICD-10-CM

## 2013-09-03 DIAGNOSIS — Z7401 Bed confinement status: Secondary | ICD-10-CM

## 2013-09-03 LAB — CBC
HCT: 41.1 % (ref 36.0–46.0)
HEMOGLOBIN: 14.1 g/dL (ref 12.0–15.0)
MCH: 31.8 pg (ref 26.0–34.0)
MCHC: 34.3 g/dL (ref 30.0–36.0)
MCV: 92.8 fL (ref 78.0–100.0)
PLATELETS: 399 10*3/uL (ref 150–400)
RBC: 4.43 MIL/uL (ref 3.87–5.11)
RDW: 14.3 % (ref 11.5–15.5)
WBC: 14.7 10*3/uL — ABNORMAL HIGH (ref 4.0–10.5)

## 2013-09-03 LAB — BASIC METABOLIC PANEL
BUN: 6 mg/dL (ref 6–23)
CALCIUM: 8.6 mg/dL (ref 8.4–10.5)
CO2: 19 mEq/L (ref 19–32)
Chloride: 106 mEq/L (ref 96–112)
Creatinine, Ser: 0.37 mg/dL — ABNORMAL LOW (ref 0.50–1.10)
Glucose, Bld: 115 mg/dL — ABNORMAL HIGH (ref 70–99)
POTASSIUM: 5 meq/L (ref 3.7–5.3)
Sodium: 140 mEq/L (ref 137–147)

## 2013-09-03 LAB — GLUCOSE, CAPILLARY: GLUCOSE-CAPILLARY: 127 mg/dL — AB (ref 70–99)

## 2013-09-03 MED ORDER — HYDROXYZINE HCL 25 MG PO TABS
25.0000 mg | ORAL_TABLET | Freq: Three times a day (TID) | ORAL | Status: DC | PRN
  Administered 2013-09-03 – 2013-09-04 (×2): 25 mg via ORAL
  Filled 2013-09-03 (×2): qty 1

## 2013-09-03 MED ORDER — DIPHENHYDRAMINE HCL 25 MG PO CAPS
25.0000 mg | ORAL_CAPSULE | Freq: Once | ORAL | Status: AC
  Administered 2013-09-03: 25 mg via ORAL
  Filled 2013-09-03: qty 1

## 2013-09-03 NOTE — Consult Note (Addendum)
WOC wound consult note Reason for Consult: Consult requested to assess buttocks wound.  Nurse noted stage 2 wound present on admission; this is actually moisture-associated skin damage.  Pt has multiple systemic factors which can impair healing which include frequently incontinent and immobile R/T MS.  Pt has been incontinent of large amt liquid stool and soiled areas have extended to her armpits. Wound type: Patchy areas of partial thickness skin loss in irregular pattern across bilat buttocks and sacrum areas Wound bed: red and moist Drainage (amount, consistency, odor) no odor or drainage Dressing procedure/placement/frequency: Barrier cream to repel moisture and protect skin. Please re-consult if further assistance is needed.  Thank-you,  Cammie Mcgee MSN, RN, CWOCN, [place], CNS [phone]

## 2013-09-03 NOTE — Progress Notes (Signed)
[facility] PROGRESS NOTE  Ashley CroakCynthia A Stanley ZOX:[id number] DOB: [birth year] DOA: 09/01/2013 PCP: [name], [name], MD  Assessment/Plan:  #1 recurrent urinary tract infection Urine cultures pending. Renal ultrasound negative. Continue empiric IV Rocephin. Follow.  #2 toxic encephalopathy/metabolic encephalopathy Likely secondary to problem #1. Clinical improvement. Continue IV antibiotics as stated in problem #1.  #3 hypokalemia Replete.  #4 dehydration IV fluids.  #5 gastroesophageal reflux disease PPI.  #6 multiple sclerosis Bedridden. Stable.  #7 hypothyroidism Check a TSH. Continue Synthroid.  #8 diabetes mellitus type 2 Check a hemoglobin A1c. CBG ranging from 110 -115. SSI  #9 sacral decubitus stage II/bedridden bedbound Continue current wound care. Consult wound care nurse.  #10 prophylaxis PPI for GI prophylaxis. Lovenox for DVT prophylaxis.  Code Status: Full Family Communication: Updated patient no family present. Disposition Plan: Home when medically stable.   Consultants:  None  Procedures:  Renal US 09/02/13  Antibiotics:  IV Rocephin 09/01/13  HPI/Subjective: Patient seen and examined, no complaints this morning. Urine culture is still pending.  Objective: Filed Vitals:   09/03/13 0411  BP: 139/90  Pulse: 91  Temp: 97.8 F (36.6 C)  Resp:     Intake/Output Summary (Last 24 hours) at 09/03/13 1155 Last data filed at 09/03/13 0700  Gross per 24 hour  Intake 4672.08 ml  Output      0 ml  Net 4672.08 ml   Filed Weights   09/01/13 1853 09/02/13 0445 09/03/13 0411  Weight: 74.208 kg (163 lb 9.6 oz) 73.483 kg (162 lb) 73.029 kg (161 lb)    Exam:  Physical Exam: Head: Normocephalic, atraumatic.  Eyes: No signs of jaundice, EOMI Nose: Mucous membranes dry.  Throat: Oropharynx nonerythematous, no exudate appreciated.  Neck: supple,No deformities, masses, or tenderness noted. Lungs: Normal respiratory effort. B/L Clear to  auscultation, no crackles or wheezes.  Heart: Regular RR. S1 and S2 normal  Abdomen: BS normoactive. Soft, Nondistended, non-tender.  Extremities: No pretibial edema, no erythema   Data Reviewed: Basic Metabolic Panel:  Recent Labs Lab 09/01/13 1225 09/01/13 1832 09/02/13 0417 09/03/13 0410  NA 146  --  144 140  K 3.2*  --  3.3* 5.0  CL 106  --  105 106  CO2 27  --  22 19  GLUCOSE 115*  --  104* 115*  BUN 10  --  5* 6  CREATININE 0.50 0.37* 0.36* 0.37*  CALCIUM 9.3  --  8.4 8.6  MG  --   --  1.7  --    Liver Function Tests: No results found for this basename: AST, ALT, ALKPHOS, BILITOT, PROT, ALBUMIN,  in the last 168 hours No results found for this basename: LIPASE, AMYLASE,  in the last 168 hours No results found for this basename: AMMONIA,  in the last 168 hours CBC:  Recent Labs Lab 09/01/13 1225 09/01/13 1832 09/02/13 0417 09/03/13 0410  WBC 17.0* 16.3* 14.1* 14.7*  NEUTROABS  --   --  11.3*  --   HGB 14.2 13.5 13.3 14.1  HCT 43.5 39.9 39.9 41.1  MCV 95.4 93.4 93.7 92.8  PLT 353 357 350 399   Cardiac Enzymes: No results found for this basename: CKTOTAL, CKMB, CKMBINDEX, TROPONINI,  in the last 168 hours BNP (last 3 results) No results found for this basename: PROBNP,  in the last 8760 hours CBG:  Recent Labs Lab 09/01/13 2018 09/02/13 0728 09/02/13 1148 09/02/13 1715 09/02/13 2145  GLUCAP 110* 110* 93 101* 102*    Recent Results (from  the past 240 hour(s))  URINE CULTURE     Status: None   Collection Time    09/01/13 12:25 PM      Result Value Ref Range Status   Specimen Description URINE, CATHETERIZED   Final   Special Requests NONE   Final   Culture  Setup Time     Final   Value: 09/02/2013 16:51     Performed at [facility]   Colony Count PENDING   Incomplete   Culture     Final   Value: Culture reincubated for better growth     Performed at [facility]   Report Status PENDING   Incomplete     Studies: US  Renal  09/02/2013   CLINICAL DATA:  63 year old female with recurrent urinary tract infection. Initial encounter.  EXAM: RENAL/URINARY TRACT ULTRASOUND COMPLETE  COMPARISON:  CT Abdomen and Pelvis 03/20/2012.  FINDINGS: Right Kidney:  Length: 10.2 cm. Echogenicity within normal limits. No mass or hydronephrosis visualized.  Left Kidney:  Length: 11.3 cm. Echogenicity within normal limits. No mass or hydronephrosis visualized.  Bladder:  Diminutive.  No layering debris identified.  IMPRESSION: Stable, negative appearance of the kidneys and bladder.   Electronically Signed   By: Augusto Gamble M.D.   On: 09/02/2013 10:38    Scheduled Meds: . cefTRIAXone (ROCEPHIN)  IV  1 g Intravenous Q24H  . docusate sodium  100 mg Oral BID  . enoxaparin (LOVENOX) injection  40 mg Subcutaneous Q24H  . feeding supplement (ENSURE COMPLETE)  237 mL Oral BID BM  . insulin aspart  0-9 Units Subcutaneous TID WC  . levothyroxine  125 mcg Oral QAC breakfast  . sodium chloride  3 mL Intravenous Q12H   Continuous Infusions: . 0.9 % NaCl with KCl 40 mEq / L 75 mL/hr at 09/03/13 1015    Principal Problem:   UTI Active Problems:   Multiple sclerosis   GERD (gastroesophageal reflux disease)   Encephalopathy, toxic   Dehydration   Obesity, unspecified   DM type 2 (diabetes mellitus, type 2)   Other and unspecified hyperlipidemia   Unspecified hypothyroidism   Hypokalemia   Sacral decubitus ulcer, stage II   Bedridden    Time spent: 35 mins    [facility] S MD [facility] Pager [phone]. If 7PM-7AM, please contact night-coverage at [email or web address], password [email or web address] 09/03/2013, 11:55 AM  LOS: 2 days

## 2013-09-04 DIAGNOSIS — R4182 Altered mental status, unspecified: Secondary | ICD-10-CM

## 2013-09-04 LAB — GLUCOSE, CAPILLARY
GLUCOSE-CAPILLARY: 103 mg/dL — AB (ref 70–99)
GLUCOSE-CAPILLARY: 99 mg/dL (ref 70–99)
Glucose-Capillary: 97 mg/dL (ref 70–99)
Glucose-Capillary: 88 mg/dL (ref 70–99)
Glucose-Capillary: 103 mg/dL — ABNORMAL HIGH (ref 70–99)

## 2013-09-04 MED ORDER — DIPHENHYDRAMINE HCL 25 MG PO CAPS
50.0000 mg | ORAL_CAPSULE | Freq: Three times a day (TID) | ORAL | Status: DC | PRN
  Administered 2013-09-04 – 2013-09-05 (×2): 50 mg via ORAL
  Filled 2013-09-04 (×2): qty 2

## 2013-09-04 MED ORDER — DIPHENHYDRAMINE HCL 25 MG PO CAPS
25.0000 mg | ORAL_CAPSULE | Freq: Four times a day (QID) | ORAL | Status: DC | PRN
  Administered 2013-09-04: 25 mg via ORAL
  Filled 2013-09-04: qty 1

## 2013-09-04 MED ORDER — DIPHENHYDRAMINE HCL 25 MG PO CAPS: 50.0000 mg | ORAL_CAPSULE | Freq: Four times a day (QID) | ORAL | Status: DC | PRN

## 2013-09-04 NOTE — Progress Notes (Signed)
[facility] PROGRESS NOTE  [name] TFT:[id number] DOB: December 29, 1950 DOA: 09/01/2013 PCP: Florentina Jenny, MD  Assessment/Plan:  #1 recurrent urinary tract infection Urine cultures pending. Renal ultrasound negative. Continue empiric IV Rocephin. Follow.  #2 toxic encephalopathy/metabolic encephalopathy Likely secondary to problem #1. Clinical improvement. Continue IV antibiotics as stated in problem #1.  #3 hypokalemia Replete. Potassium is  5.0 today.  #4 dehydration Resolved,, will change the IV fluids to [facility].  #5 gastroesophageal reflux disease PPI.  #6 multiple sclerosis Bedridden. Stable.  #7 hypothyroidism TSH is 0.324 Continue Synthroid.  #8 diabetes mellitus type 2 Check a hemoglobin A1c. CBG ranging from 110 -115. SSI  #9 sacral decubitus stage II/bedridden bedbound Continue current wound care. Consult wound care nurse.  #10 prophylaxis PPI for GI prophylaxis. Lovenox for DVT prophylaxis.  Code Status: Full Family Communication: Updated patient no family present. Disposition Plan: Home when medically stable.   Consultants:  None  Procedures:  Renal US 09/02/13  Antibiotics:  IV Rocephin 09/01/13  HPI/Subjective: 63 yr old female with h/o MS, bed bound presented to [facility], with poor po intake, altered mental status, caregiver found the pt leaning over to her right side and her mouth was drawn to the right. Patient has no history of TIA or stroke. Patient is reportedly on prophylactic abx for frequent bladder infections. Caregiver states she had a UTI recently, with similar symptoms. Patient is bedridden d/t MS, she is unable to move from legs down. At baseline she is fully functional and watches TV, feeds herself and is active on computer. She has 24 hour care in home. Patient is repeatedly stating, "no, it does not hurt". No fever  Patient seen and examined, no complaints this morning. Urine culture is still  pending.  Objective: Filed Vitals:   09/04/13 0537  BP: 140/59  Pulse: 86  Temp: 97.4 F (36.3 C)  Resp:     Intake/Output Summary (Last 24 hours) at 09/04/13 1146 Last data filed at 09/04/13 0800  Gross per 24 hour  Intake   1420 ml  Output      0 ml  Net   1420 ml   Filed Weights   09/01/13 1853 09/02/13 0445 09/03/13 0411  Weight: 74.208 kg (163 lb 9.6 oz) 73.483 kg (162 lb) 73.029 kg (161 lb)    Exam:  Physical Exam: Head: Normocephalic, atraumatic.  Eyes: No signs of jaundice, EOMI Nose: Mucous membranes dry.  Throat: Oropharynx nonerythematous, no exudate appreciated.  Neck: supple,No deformities, masses, or tenderness noted. Lungs: Normal respiratory effort. B/L Clear to auscultation, no crackles or wheezes.  Heart: Regular RR. S1 and S2 normal  Abdomen: BS normoactive. Soft, Nondistended, non-tender.  Extremities: No pretibial edema, no erythema   Data Reviewed: Basic Metabolic Panel:  Recent Labs Lab 09/01/13 1225 09/01/13 1832 09/02/13 0417 09/03/13 0410  NA 146  --  144 140  K 3.2*  --  3.3* 5.0  CL 106  --  105 106  CO2 27  --  22 19  GLUCOSE 115*  --  104* 115*  BUN 10  --  5* 6  CREATININE 0.50 0.37* 0.36* 0.37*  CALCIUM 9.3  --  8.4 8.6  MG  --   --  1.7  --    Liver Function Tests: No results found for this basename: AST, ALT, ALKPHOS, BILITOT, PROT, ALBUMIN,  in the last 168 hours No results found for this basename: LIPASE, AMYLASE,  in the last 168 hours No results found for  this basename: AMMONIA,  in the last 168 hours CBC:  Recent Labs Lab 09/01/13 1225 09/01/13 1832 09/02/13 0417 09/03/13 0410  WBC 17.0* 16.3* 14.1* 14.7*  NEUTROABS  --   --  11.3*  --   HGB 14.2 13.5 13.3 14.1  HCT 43.5 39.9 39.9 41.1  MCV 95.4 93.4 93.7 92.8  PLT 353 357 350 399   Cardiac Enzymes: No results found for this basename: CKTOTAL, CKMB, CKMBINDEX, TROPONINI,  in the last 168 hours BNP (last 3 results) No results found for this basename:  PROBNP,  in the last 8760 hours CBG:  Recent Labs Lab 09/02/13 2145 09/03/13 1628 09/03/13 2103 09/04/13 0733 09/04/13 1129  GLUCAP 102* 127* 103* 97 88    Recent Results (from the past 240 hour(s))  URINE CULTURE     Status: None   Collection Time    09/01/13 12:25 PM      Result Value Ref Range Status   Specimen Description URINE, CATHETERIZED   Final   Special Requests NONE   Final   Culture  Setup Time     Final   Value: 09/02/2013 16:51     [facility]formed [facility]   Colony Count PENDING   Incomplete   Culture     Final   Value: Culture reincubated for better growth     [facility]   Report Status PENDING   Incomplete     Studies: No results found.  Scheduled Meds: . cefTRIAXone (ROCEPHIN)  IV  1 g Intravenous Q24H  . docusate sodium  100 mg Oral BID  . enoxaparin (LOVENOX) injection  40 mg Subcutaneous Q24H  . feeding supplement (ENSURE COMPLETE)  237 mL Oral BID BM  . insulin aspart  0-9 Units Subcutaneous TID WC  . levothyroxine  125 mcg Oral QAC breakfast  . sodium chloride  3 mL Intravenous Q12H   Continuous Infusions: . 0.9 % NaCl with KCl 40 mEq / L 75 mL/hr at 09/04/13 0004    Principal Problem:   UTI Active Problems:   Multiple sclerosis   GERD (gastroesophageal reflux disease)   Encephalopathy, toxic   Dehydration   Obesity, unspecified   DM type 2 (diabetes mellitus, type 2)   Other and unspecified hyperlipidemia   Unspecified hypothyroidism   Hypokalemia   Sacral decubitus ulcer, stage II   Bedridden    Time spent: 35 mins    [facility],Jaylinn Hellenbrand S MD [facility] Pager [phone]. If 7PM-7AM, please contact night-coverage at [email or web address], password [email or web address] 09/04/2013, 11:46 AM  LOS: 3 days

## 2013-09-04 NOTE — Progress Notes (Signed)
Chaplain provided pastoral care and support to patient. Chaplain provided ministry of presence. Chaplain provided empathic listening as the patient shared her narrative. Patient said "she is concerned about her father and her husband." Chaplain provided spiritual support through prayer.  09/04/13 1400  Clinical Encounter Type  Visited With Patient  Visit Type Initial;Spiritual support;Social support  Referral From Patient  Spiritual Encounters  Spiritual Needs Prayer;Emotional  Stress Factors  Patient Stress Factors Family relationships;Health changes

## 2013-09-05 DIAGNOSIS — R404 Transient alteration of awareness: Secondary | ICD-10-CM

## 2013-09-05 LAB — BASIC METABOLIC PANEL
BUN: 10 mg/dL (ref 6–23)
CO2: 20 meq/L (ref 19–32)
Calcium: 9.5 mg/dL (ref 8.4–10.5)
Chloride: 100 mEq/L (ref 96–112)
Creatinine, Ser: 0.47 mg/dL — ABNORMAL LOW (ref 0.50–1.10)
GFR calc Af Amer: >90 mL/min (ref >90)
GFR calc non Af Amer: >90 mL/min (ref >90)
Glucose, Bld: 94 mg/dL (ref 70–99)
POTASSIUM: 4.4 meq/L (ref 3.7–5.3)
Sodium: 135 mEq/L — ABNORMAL LOW (ref 137–147)

## 2013-09-05 LAB — CBC
HCT: 42.7 % (ref 36.0–46.0)
HEMOGLOBIN: 13.9 g/dL (ref 12.0–15.0)
MCH: 30.5 pg (ref 26.0–34.0)
MCHC: 32.6 g/dL (ref 30.0–36.0)
MCV: 93.6 fL (ref 78.0–100.0)
Platelets: 367 10*3/uL (ref 150–400)
RBC: 4.56 MIL/uL (ref 3.87–5.11)
RDW: 14.7 % (ref 11.5–15.5)
WBC: 10.7 10*3/uL — ABNORMAL HIGH (ref 4.0–10.5)

## 2013-09-05 LAB — URINE CULTURE: Colony Count: >=100000

## 2013-09-05 LAB — GLUCOSE, CAPILLARY: GLUCOSE-CAPILLARY: 105 mg/dL — AB (ref 70–99)

## 2013-09-05 MED ORDER — SULFAMETHOXAZOLE-TRIMETHOPRIM 400-80 MG PO TABS: 1.0000 | ORAL_TABLET | Freq: Every day | ORAL | Status: DC

## 2013-09-05 MED ORDER — SULFAMETHOXAZOLE-TRIMETHOPRIM 400-80 MG PO TABS
1.0000 | ORAL_TABLET | Freq: Two times a day (BID) | ORAL | Status: DC
  Administered 2013-09-05: 1 via ORAL
  Filled 2013-09-05 (×2): qty 1

## 2013-09-05 MED ORDER — FUROSEMIDE 20 MG PO TABS: 20.0000 mg | ORAL_TABLET | Freq: Every day | ORAL | Status: DC

## 2013-09-05 MED ORDER — HYDROXYZINE HCL 25 MG PO TABS: 25.0000 mg | ORAL_TABLET | Freq: Three times a day (TID) | ORAL | Status: DC | PRN

## 2013-09-05 NOTE — Discharge Summary (Signed)
Physician Discharge Summary  Patient ID: Ashley CroakCynthia A Gadsden MRN: [id number] DOB/AGE: 1950/10/21 63 y.o.  Admit date: 09/01/2013 Discharge date: 09/05/2013  Primary Care Physician:  [name], [name], MD  Discharge Diagnoses:    . UTI urine culture positive for Escherichia coli and Klebsiella  . Multiple sclerosis . Encephalopathy, toxic . Dehydration . Obesity, unspecified . DM type 2 (diabetes mellitus, type 2) . Other and unspecified hyperlipidemia . Unspecified hypothyroidism . GERD (gastroesophageal reflux disease) . Hypokalemia . Sacral decubitus ulcer, stage II  Consults: None   Recommendations for Outpatient Follow-up:  Please follow her thyroid function tests, TSH was 0.336, May decrease in Synthroid dose  Allergies:   Allergies  Allergen Reactions  . Avelox [Moxifloxacin Hcl In Nacl] Hives and Other (See Comments)    Panic attack  . Ciprofloxacin Hcl Hives and Other (See Comments)    Panic attack  . Penicillins Hives  . Shrimp [Shellfish Allergy] Other (See Comments)    flushing     Discharge Medications:   Medication List    STOP taking these medications       QUEtiapine 25 MG tablet  Commonly known as:  SEROQUEL      TAKE these medications       acetaminophen 500 MG tablet  Commonly known as:  TYLENOL  Take 1,000 mg by mouth at bedtime as needed for mild pain.     aspirin EC 81 MG tablet  Take 81 mg by mouth daily.     atorvastatin 20 MG tablet  Commonly known as:  LIPITOR  Take 20 mg by mouth at bedtime.     bisacodyl 5 MG EC tablet  Commonly known as:  bisacodyl  Take 1 tablet (5 mg total) by mouth daily as needed for moderate constipation.     CRANBERRY PO  Take 1 tablet by mouth daily as needed (for urinary pain).     darifenacin 7.5 MG 24 hr tablet  Commonly known as:  ENABLEX  Take 7.5 mg by mouth 2 (two) times daily.     diphenhydrAMINE 25 MG tablet  Commonly known as:  BENADRYL  Take 50 mg by mouth every 6 (six) hours as needed  for itching.     docusate sodium 100 MG capsule  Commonly known as:  COLACE  Take 100 mg by mouth daily as needed for mild constipation.     esomeprazole 40 MG capsule  Commonly known as:  NEXIUM  Take 40 mg by mouth every morning.     furosemide 20 MG tablet  Commonly known as:  LASIX  Take 1 tablet (20 mg total) by mouth daily.     gabapentin 400 MG capsule  Commonly known as:  NEURONTIN  Take 800 mg by mouth 4 (four) times daily.     GAS-X EXTRA STRENGTH 125 MG chewable tablet  Generic drug:  simethicone  Chew 125 mg by mouth every 6 (six) hours as needed for flatulence.     hydrOXYzine 25 MG tablet  Commonly known as:  ATARAX/VISTARIL  Take 1 tablet (25 mg total) by mouth every 8 (eight) hours as needed for itching.     levothyroxine 125 MCG tablet  Commonly known as:  SYNTHROID  Take 1 tablet (125 mcg total) by mouth daily before breakfast.     metFORMIN 500 MG tablet  Commonly known as:  GLUCOPHAGE  Take 250 mg by mouth daily.     methocarbamol 500 MG tablet  Commonly known as:  ROBAXIN  Take 500 mg  by mouth 4 (four) times daily.     mineral oil-hydrophilic petrolatum ointment  Apply 1 application topically as needed for dry skin.     multivitamin with minerals Tabs tablet  Take 1 tablet by mouth daily.     nystatin cream  Commonly known as:  MYCOSTATIN  Apply 1 application topically as needed for dry skin.     nystatin 100000 UNIT/GM Powd  Apply 1 Bottle topically 2 (two) times daily. To perineal/groin area and right great toe as needed for yeast     sulfamethoxazole-trimethoprim 400-80 MG per tablet  Commonly known as:  BACTRIM,SEPTRA  Take 1 tablet by mouth daily. X 4 more days         Brief H and P: For complete details please refer to admission H and P, but in brief [name] is a 63 y.o. female  With MS, bedridden, recurrent UTI. Presented to [facility] with altered mental status. Currently, no family available, so hx is per chart and EDP.  Workup showed UTI, hypokalemia. Given ceftriaxone   Hospital Course:  Principal Problem:   UTI Active Problems:   Multiple sclerosis   GERD (gastroesophageal reflux disease)   Encephalopathy, toxic   Dehydration   Obesity, unspecified   DM type 2 (diabetes mellitus, type 2)   Other and unspecified hyperlipidemia   Unspecified hypothyroidism   Hypokalemia   Sacral decubitus ulcer, stage II   Bedridden  1 recurrent urinary tract infection : Patient was admitted and was started on empiric IV Rocephin. Urine culture showed Escherichia coli and Klebsiella pneumonia, both sensitive to Bactrim. Patient has penicillin allergy and ciprofloxacin allergy hence was dc'ed on Bactrim for another 4 days to complete a course of 7 days due to history of recurrent UTIs. Renal ultrasound was negative for any renal abscess or obstruction. [place] [facility]   toxic encephalopathy/metabolic encephalopathy : Resolved likely due to UTI   hypokalemia : Likely due to Lasix, this was replaced Replete. Potassium is 5.0 today.   dehydration : Resolved, patient was provided IV fluid hydration. Lasix was held during the admission and was recommended to be resumed at discharge but decreased the dose to 20 mg daily.    gastroesophageal reflux disease continue PPI.    multiple sclerosis Bedridden. Stable.   hypothyroidism TSH is 0.324 Synthroid was continued, may want to decrease Synthroid out patient  diabetes mellitus type 2   CBG ranging from 110 -115, patient was placed on sliding scale insulin and inpatient.  #9 sacral decubitus stage II/bedridden bedbound  Continue current wound care. Wound care was consulted and recommended barrier cream    Day of Discharge BP 153/74  Pulse 89  Temp(Src) 97.7 F (36.5 C) (Oral)  Resp 18  Wt 73.029 kg (161 lb)  SpO2 97%  Physical Exam: General: Alert and awake oriented x3 not in any acute distress. CVS: S1-S2 clear no murmur rubs or gallops Chest: clear to auscultation  bilaterally, no wheezing rales or rhonchi Abdomen: soft nontender, nondistended, normal bowel sounds Extremities: no cyanosis, clubbing or edema noted bilaterally    The results of significant diagnostics from this hospitalization (including imaging, microbiology, ancillary and laboratory) are listed below for reference.    LAB RESULTS: Basic Metabolic Panel:  Recent Labs Lab 09/02/13 0417 09/03/13 0410 09/05/13 0515  NA 144 140 135*  K 3.3* 5.0 4.4  CL 105 106 100  CO2 22 19 20   GLUCOSE 104* 115* 94  BUN 5* 6 10  CREATININE 0.36* 0.37* 0.47*  CALCIUM 8.4 8.6 9.5  MG 1.7  --   --    Liver Function Tests: No results found for this basename: AST, ALT, ALKPHOS, BILITOT, PROT, ALBUMIN,  in the last 168 hours No results found for this basename: LIPASE, AMYLASE,  in the last 168 hours No results found for this basename: AMMONIA,  in the last 168 hours CBC:  Recent Labs Lab 09/02/13 0417 09/03/13 0410 09/05/13 0515  WBC 14.1* 14.7* 10.7*  NEUTROABS 11.3*  --   --   HGB 13.3 14.1 13.9  HCT 39.9 41.1 42.7  MCV 93.7 92.8 93.6  PLT 350 399 367   Cardiac Enzymes: No results found for this basename: CKTOTAL, CKMB, CKMBINDEX, TROPONINI,  in the last 168 hours BNP: No components found with this basename: POCBNP,  CBG:  Recent Labs Lab 09/04/13 2110 09/05/13 0729  GLUCAP 103* 105*    Significant Diagnostic Studies:  US Renal  09/02/2013   CLINICAL DATA:  63 year old female with recurrent urinary tract infection. Initial encounter.  EXAM: RENAL/URINARY TRACT ULTRASOUND COMPLETE  COMPARISON:  CT Abdomen and Pelvis 03/20/2012.  FINDINGS: Right Kidney:  Length: 10.2 cm. Echogenicity within normal limits. No mass or hydronephrosis visualized.  Left Kidney:  Length: 11.3 cm. Echogenicity within normal limits. No mass or hydronephrosis visualized.  Bladder:  Diminutive.  No layering debris identified.  IMPRESSION: Stable, negative appearance of the kidneys and bladder.    Electronically Signed   By: Augusto Gamble M.D.   On: 09/02/2013 10:38    :   Disposition and Follow-up: Discharge Orders   Future Orders Complete By Expires   Diet Carb Modified  As directed    Increase activity slowly  As directed        DISPOSITION: Home DIET: Carb modified   DISCHARGE FOLLOW-UP Follow-up Information   Follow up with Florentina Jenny, MD. Schedule an appointment as soon as possible for a visit in 10 days. (for hospital follow-up)    Specialty:  Family Medicine   Contact information:   [address] [place] [address] [phone]       Time spent on Discharge: 35 minutes  Signed:   RAI,RIPUDEEP M.D. [facility] 09/05/2013, 9:20 AM Pager: [phone]

## 2013-09-05 NOTE — Clinical Social Work Note (Signed)
Per MD patient ready to DC home. RN confirmed that patient would be transported to home address. DC packet for ambulance transport placed on chart. Ambulance transport requested for 11:00AM per RNs request. CSW signing off at this time.  Roddie Mc, [place], [employer], [phone]

## 2013-11-26 ENCOUNTER — Other Ambulatory Visit: Payer: Self-pay | Admitting: Urology

## 2013-11-27 ENCOUNTER — Emergency Department (HOSPITAL_BASED_OUTPATIENT_CLINIC_OR_DEPARTMENT_OTHER)
Admission: EM | Admit: 2013-11-27 | Discharge: 2013-11-27 | Disposition: A | Discharge status: Home / Self Care | Payer: Medicare Other | Attending: Emergency Medicine | Admitting: Emergency Medicine

## 2013-11-27 ENCOUNTER — Encounter (HOSPITAL_BASED_OUTPATIENT_CLINIC_OR_DEPARTMENT_OTHER): Payer: Self-pay | Admitting: Emergency Medicine

## 2013-11-27 DIAGNOSIS — E039 Hypothyroidism, unspecified: Secondary | ICD-10-CM | POA: Insufficient documentation

## 2013-11-27 DIAGNOSIS — Z8669 Personal history of other diseases of the nervous system and sense organs: Secondary | ICD-10-CM | POA: Insufficient documentation

## 2013-11-27 DIAGNOSIS — K219 Gastro-esophageal reflux disease without esophagitis: Secondary | ICD-10-CM | POA: Insufficient documentation

## 2013-11-27 DIAGNOSIS — H5440 Blindness, one eye, unspecified eye: Secondary | ICD-10-CM | POA: Insufficient documentation

## 2013-11-27 DIAGNOSIS — Z872 Personal history of diseases of the skin and subcutaneous tissue: Secondary | ICD-10-CM | POA: Insufficient documentation

## 2013-11-27 DIAGNOSIS — E119 Type 2 diabetes mellitus without complications: Secondary | ICD-10-CM | POA: Insufficient documentation

## 2013-11-27 DIAGNOSIS — Z7982 Long term (current) use of aspirin: Secondary | ICD-10-CM | POA: Insufficient documentation

## 2013-11-27 DIAGNOSIS — Z880 Allergy status to penicillin: Secondary | ICD-10-CM | POA: Insufficient documentation

## 2013-11-27 DIAGNOSIS — M129 Arthropathy, unspecified: Secondary | ICD-10-CM | POA: Insufficient documentation

## 2013-11-27 DIAGNOSIS — Z792 Long term (current) use of antibiotics: Secondary | ICD-10-CM | POA: Insufficient documentation

## 2013-11-27 DIAGNOSIS — E7800 Pure hypercholesterolemia, unspecified: Secondary | ICD-10-CM | POA: Insufficient documentation

## 2013-11-27 DIAGNOSIS — N390 Urinary tract infection, site not specified: Secondary | ICD-10-CM

## 2013-11-27 DIAGNOSIS — Z7401 Bed confinement status: Secondary | ICD-10-CM | POA: Insufficient documentation

## 2013-11-27 DIAGNOSIS — F411 Generalized anxiety disorder: Secondary | ICD-10-CM | POA: Insufficient documentation

## 2013-11-27 HISTORY — DX: Urinary tract infection, site not specified: N39.0

## 2013-11-27 LAB — COMPREHENSIVE METABOLIC PANEL
ALT: 13 U/L (ref 0–35)
AST: 17 U/L (ref 0–37)
Albumin: 3.9 g/dL (ref 3.5–5.2)
Alkaline Phosphatase: 154 U/L — ABNORMAL HIGH (ref 39–117)
BUN: 12 mg/dL (ref 6–23)
CHLORIDE: 101 meq/L (ref 96–112)
CO2: 28 meq/L (ref 19–32)
CREATININE: 0.5 mg/dL (ref 0.50–1.10)
Calcium: 10 mg/dL (ref 8.4–10.5)
GFR calc non Af Amer: >90 mL/min (ref >90)
GLUCOSE: 126 mg/dL — AB (ref 70–99)
Potassium: 3.3 mEq/L — ABNORMAL LOW (ref 3.7–5.3)
Sodium: 144 mEq/L (ref 137–147)
Total Bilirubin: 0.4 mg/dL (ref 0.3–1.2)
Total Protein: 8 g/dL (ref 6.0–8.3)

## 2013-11-27 LAB — CBC WITH DIFFERENTIAL/PLATELET
BASOS ABS: 0.1 10*3/uL (ref 0.0–0.1)
Basophils Relative: 1 % (ref 0–1)
Eosinophils Absolute: 0.2 10*3/uL (ref 0.0–0.7)
Eosinophils Relative: 1 % (ref 0–5)
HCT: 44.1 % (ref 36.0–46.0)
Hemoglobin: 14.8 g/dL (ref 12.0–15.0)
LYMPHS ABS: 1.6 10*3/uL (ref 0.7–4.0)
LYMPHS PCT: 12 % (ref 12–46)
MCH: 31.1 pg (ref 26.0–34.0)
MCHC: 33.6 g/dL (ref 30.0–36.0)
MCV: 92.6 fL (ref 78.0–100.0)
Monocytes Absolute: 0.5 10*3/uL (ref 0.1–1.0)
Monocytes Relative: 4 % (ref 3–12)
NEUTROS PCT: 83 % — AB (ref 43–77)
Neutro Abs: 11 10*3/uL — ABNORMAL HIGH (ref 1.7–7.7)
PLATELETS: 434 10*3/uL — AB (ref 150–400)
RBC: 4.76 MIL/uL (ref 3.87–5.11)
RDW: 12.9 % (ref 11.5–15.5)
WBC: 13.3 10*3/uL — AB (ref 4.0–10.5)

## 2013-11-27 LAB — URINALYSIS, ROUTINE W REFLEX MICROSCOPIC
BILIRUBIN URINE: NEGATIVE
GLUCOSE, UA: NEGATIVE mg/dL
HGB URINE DIPSTICK: NEGATIVE
KETONES UR: NEGATIVE mg/dL
NITRITE: NEGATIVE
PH: 7 (ref 5.0–8.0)
Protein, ur: 30 mg/dL — AB
Specific Gravity, Urine: 1.021 (ref 1.005–1.030)
Urobilinogen, UA: 1 mg/dL (ref 0.0–1.0)

## 2013-11-27 LAB — URINE MICROSCOPIC-ADD ON

## 2013-11-27 LAB — I-STAT CG4 LACTIC ACID, ED: Lactic Acid, Venous: 2.09 mmol/L (ref 0.5–2.2)

## 2013-11-27 MED ORDER — CEFTRIAXONE SODIUM 1 G IJ SOLR
INTRAMUSCULAR | Status: AC
  Administered 2013-11-27: 1000 mg
  Filled 2013-11-27: qty 10

## 2013-11-27 MED ORDER — DEXTROSE 5 % IV SOLN: 1.0000 g | Freq: Once | INTRAVENOUS | Status: DC

## 2013-11-27 MED ORDER — CEPHALEXIN 500 MG PO CAPS: 500.0000 mg | ORAL_CAPSULE | Freq: Four times a day (QID) | ORAL | Status: DC

## 2013-11-27 MED ORDER — CEFTRIAXONE SODIUM 1 G IJ SOLR
INTRAMUSCULAR | Status: AC
  Filled 2013-11-27: qty 10

## 2013-11-27 NOTE — ED Provider Notes (Signed)
CSN: [id number]     Arrival date & time 11/27/13  1054 History   First MD Initiated Contact with Patient 11/27/13 1126     Chief Complaint  Patient presents with  . Altered Mental Status      HPI Per EMS: Home health aide noticed pt becoming more confused and agisted for last two days. Last known normal three days ago. No acute neuro deficits noted, just some confusion.  According to caregiver patient's had similar episodes when she had UTIs in the past.  Past Medical History  Diagnosis Date  . Urinary incontinence     neurogenic bladder--hx of uti's  . Thyroid disease   . Multiple sclerosis   . GERD (gastroesophageal reflux disease)   . Hypothyroidism   . Anxiety   . Blindness of right eye     complication of ms  . Cellulitis     hx of cellulitis in lower extremities -requiring multiple hospitalzations in the past--no problems in las 2 yrs  . Neuromuscular disorder     ms-not ambulatory-muscle spasms-and recent tremors left arm and left  leg have developed--dr. Daphane ShepherdMeyer in [place] is pt's neurologist  . Bedridden 09/02/2013  . High cholesterol   . Type II diabetes mellitus   . Arthritis     old falls-with injuries to rt knee and both ankles and rt wrist--states she has some pain in these areas-probable arthritis  . Arthritis     "hands" (09/03/2013)  . UTI (lower urinary tract infection)    Past Surgical History  Procedure Laterality Date  . Cholecystectomy    . Tonsillectomy    . Orif ankle fracture Left 2005  . Wrist surgery Right   . Lymph gland excision    . Mediastinoscopy  04/24/10    for enlarged right paratracheal lymph node--surgery at [facility]-Dr. Burney--pt states she does not know the  what the pathology report showed.  . Adenoidectomy    . Fracture surgery    . Cystoscopy  07/02/2011    Procedure: CYSTOSCOPY;  Surgeon: Kathi LudwigSigmund I Tannenbaum, MD;  Location: [facility];  Service: Urology;  Laterality: N/A;   No family history on file. History  Substance Use  Topics  . Smoking status: Never Smoker   . Smokeless tobacco: Never Used  . Alcohol Use: No   OB History   Grav Para Term Preterm Abortions TAB SAB Ect Mult Living                 Review of Systems  Unable to perform ROS: Mental status change      Allergies  Avelox; Ciprofloxacin hcl; Penicillins; and Shrimp  Home Medications   Prior to Admission medications   Medication Sig Start Date End Date Taking? Authorizing Provider  acetaminophen (TYLENOL) 500 MG tablet Take 1,000 mg by mouth at bedtime as needed for mild pain.    Historical Provider, MD  aspirin EC 81 MG tablet Take 81 mg by mouth daily.    Historical Provider, MD  atorvastatin (LIPITOR) 20 MG tablet Take 20 mg by mouth at bedtime.    Historical Provider, MD  bisacodyl (BISACODYL) 5 MG EC tablet Take 1 tablet (5 mg total) by mouth daily as needed for moderate constipation. 05/11/13   Jeralyn BennettEzequiel Zamora, MD  cephALEXin (KEFLEX) 500 MG capsule Take 1 capsule (500 mg total) by mouth 4 (four) times daily. 11/27/13   Nelia Shiobert L Aivan Fillingim, MD  CRANBERRY PO Take 1 tablet by mouth daily as needed (for urinary pain).  Historical Provider, MD  darifenacin (ENABLEX) 7.5 MG 24 hr tablet Take 7.5 mg by mouth 2 (two) times daily.    Historical Provider, MD  diphenhydrAMINE (BENADRYL) 25 MG tablet Take 50 mg by mouth every 6 (six) hours as needed for itching.    Historical Provider, MD  docusate sodium (COLACE) 100 MG capsule Take 100 mg by mouth daily as needed for mild constipation.    Historical Provider, MD  esomeprazole (NEXIUM) 40 MG capsule Take 40 mg by mouth every morning.     Historical Provider, MD  furosemide (LASIX) 20 MG tablet Take 1 tablet (20 mg total) by mouth daily. 09/05/13   Ripudeep Jenna Luo, MD  gabapentin (NEURONTIN) 400 MG capsule Take 800 mg by mouth 4 (four) times daily.     Historical Provider, MD  hydrOXYzine (ATARAX/VISTARIL) 25 MG tablet Take 1 tablet (25 mg total) by mouth every 8 (eight) hours as needed for itching.  09/05/13   Ripudeep Jenna Luo, MD  levothyroxine (SYNTHROID) 125 MCG tablet Take 1 tablet (125 mcg total) by mouth daily before breakfast. 05/11/13   Jeralyn Bennett, MD  metFORMIN (GLUCOPHAGE) 500 MG tablet Take 250 mg by mouth daily.     Historical Provider, MD  methocarbamol (ROBAXIN) 500 MG tablet Take 500 mg by mouth 4 (four) times daily.    Historical Provider, MD  mineral oil-hydrophilic petrolatum (AQUAPHOR) ointment Apply 1 application topically as needed for dry skin.    Historical Provider, MD  Multiple Vitamin (MULTIVITAMIN WITH MINERALS) TABS tablet Take 1 tablet by mouth daily.    Historical Provider, MD  nystatin (MYCOSTATIN/NYSTOP) 100000 UNIT/GM POWD Apply 1 Bottle topically 2 (two) times daily. To perineal/groin area and right great toe as needed for yeast    Historical Provider, MD  nystatin cream (MYCOSTATIN) Apply 1 application topically as needed for dry skin.    Historical Provider, MD  simethicone (GAS-X EXTRA STRENGTH) 125 MG chewable tablet Chew 125 mg by mouth every 6 (six) hours as needed for flatulence.    Historical Provider, MD  sulfamethoxazole-trimethoprim (BACTRIM,SEPTRA) 400-80 MG per tablet Take 1 tablet by mouth daily. X 4 more days 09/05/13   Ripudeep K Rai, MD   BP 147/85  Pulse 97  Temp(Src) 98 F (36.7 C) (Oral)  Resp 20  Ht 5\' 2"  (1.575 m)  SpO2 100% Physical Exam  Nursing note and vitals reviewed. Constitutional: She appears well-developed and well-nourished. No distress.  HENT:  Head: Normocephalic and atraumatic.  Eyes: Pupils are equal, round, and reactive to light.  Neck: Normal range of motion.  Cardiovascular: Normal rate and intact distal pulses.   Pulmonary/Chest: No respiratory distress.  Abdominal: Normal appearance. She exhibits no distension.  Musculoskeletal: Normal range of motion.  Neurological: She is alert. She has normal strength. No cranial nerve deficit. GCS eye subscore is 4. GCS verbal subscore is 5. GCS motor subscore is 6.   Skin: Skin is warm and dry. No rash noted.  Psychiatric: She has a normal mood and affect.    ED Course  Procedures (including critical care time) Labs Review Labs Reviewed  URINALYSIS, ROUTINE W REFLEX MICROSCOPIC - Abnormal; Notable for the following:    APPearance TURBID (*)    Protein, ur 30 (*)    Leukocytes, UA LARGE (*)    All other components within normal limits  COMPREHENSIVE METABOLIC PANEL - Abnormal; Notable for the following:    Potassium 3.3 (*)    Glucose, Bld 126 (*)    Alkaline Phosphatase  154 (*)    All other components within normal limits  CBC WITH DIFFERENTIAL - Abnormal; Notable for the following:    WBC 13.3 (*)    Platelets 434 (*)    Neutrophils Relative % 83 (*)    Neutro Abs 11.0 (*)    All other components within normal limits  URINE MICROSCOPIC-ADD ON - Abnormal; Notable for the following:    Bacteria, UA MANY (*)    All other components within normal limits  URINE CULTURE  I-STAT CG4 LACTIC ACID, ED   Medications  cefTRIAXone (ROCEPHIN) 1 G injection (1,000 mg  Given 11/27/13 1319)    Imaging Review No results found.    MDM   Final diagnoses:  UTI (lower urinary tract infection)        Nelia Shi, MD 11/30/13 [phone]

## 2013-11-27 NOTE — ED Notes (Signed)
Per EMS:  Home health aide noticed pt becoming more confused and agisted for last two days.  Last known normal three days ago.  No acute neuro deficits noted, just some confusion.

## 2013-11-27 NOTE — ED Notes (Signed)
Spoke to pt caregiver Karie Kirksileen [phone] per request-notified of pt dx, tx and to be d/c-states that [facility] is to be called for her transport home

## 2013-11-27 NOTE — ED Notes (Signed)
Report to pt caretaker Aileen-pt was given filled rx keflex-Aileen notified pt would be returning with filled abx and for pt to take as directed-agreed

## 2013-11-27 NOTE — ED Notes (Signed)
[facility] here for trasnport 

## 2013-11-27 NOTE — ED Notes (Signed)
Pt notified of need for IV if EDP ordered abx-pt states she understands and we can restart IV in left arm if needed and insists [facility] to [facility]

## 2013-11-27 NOTE — ED Notes (Signed)
Lactic Acid 2.09 reported to Dr. Radford Pax.

## 2013-11-27 NOTE — Discharge Instructions (Signed)
Urinary Tract Infection  Urinary tract infections (UTIs) can develop anywhere along your urinary tract. Your urinary tract is your body's drainage system for removing wastes and extra water. Your urinary tract includes two kidneys, two ureters, a bladder, and a urethra. Your kidneys are a pair of bean-shaped organs. Each kidney is about the size of your fist. They are located below your ribs, one on each side of your spine.  CAUSES  Infections are caused by microbes, which are microscopic organisms, including fungi, viruses, and bacteria. These organisms are so small that they can only be seen through a microscope. Bacteria are the microbes that most commonly cause UTIs.  SYMPTOMS   Symptoms of UTIs may vary by age and gender of the patient and by the location of the infection. Symptoms in young women typically include a frequent and intense urge to urinate and a painful, burning feeling in the bladder or urethra during urination. Older women and men are more likely to be tired, shaky, and weak and have muscle aches and abdominal pain. A fever may mean the infection is in your kidneys. Other symptoms of a kidney infection include pain in your back or sides below the ribs, nausea, and vomiting.  DIAGNOSIS  To diagnose a UTI, your caregiver will ask you about your symptoms. Your caregiver also will ask to provide a urine sample. The urine sample will be tested for bacteria and white blood cells. White blood cells are made by your body to help fight infection.  TREATMENT   Typically, UTIs can be treated with medication. Because most UTIs are caused by a bacterial infection, they usually can be treated with the use of antibiotics. The choice of antibiotic and length of treatment depend on your symptoms and the type of bacteria causing your infection.  HOME CARE INSTRUCTIONS   If you were prescribed antibiotics, take them exactly as your caregiver instructs you. Finish the medication even if you feel better after you  have only taken some of the medication.   Drink enough water and fluids to keep your urine clear or pale yellow.   Avoid caffeine, tea, and carbonated beverages. They tend to irritate your bladder.   Empty your bladder often. Avoid holding urine for long periods of time.   Empty your bladder before and after sexual intercourse.   After a bowel movement, women should cleanse from front to back. Use each tissue only once.  SEEK MEDICAL CARE IF:    You have back pain.   You develop a fever.   Your symptoms do not begin to resolve within 3 days.  SEEK IMMEDIATE MEDICAL CARE IF:    You have severe back pain or lower abdominal pain.   You develop chills.   You have nausea or vomiting.   You have continued burning or discomfort with urination.  MAKE SURE YOU:    Understand these instructions.   Will watch your condition.   Will get help right away if you are not doing well or get worse.  Document Released: 03/28/2005 Document Revised: 12/18/2011 Document Reviewed: 07/27/2011  [employer] Patient Information 2014 [employer].

## 2013-11-29 LAB — URINE CULTURE: Colony Count: >=100000

## 2013-12-01 ENCOUNTER — Telehealth (HOSPITAL_BASED_OUTPATIENT_CLINIC_OR_DEPARTMENT_OTHER): Payer: Self-pay | Admitting: Emergency Medicine

## 2013-12-01 NOTE — Telephone Encounter (Signed)
Post ED Visit - Positive Culture Follow-up  Culture report reviewed by antimicrobial stewardship pharmacist: []  Wes Dulaney, Pharm.D., BCPS []  Celedonio Miyamoto, Pharm.D., BCPS []  Georgina Pillion, Pharm.D., BCPS [x]  [place], [address].D., BCPS, AAHIVP []  Estella Husk, Pharm.D., BCPS, AAHIVP []  Harvie Junior, Pharm.D.  Positive urine culture Treated with Keflex, organism sensitive to the same and no further patient follow-up is required at this time.  Grayce Budden 12/01/2013, 1:17 PM

## 2014-01-01 ENCOUNTER — Encounter (HOSPITAL_COMMUNITY): Payer: Self-pay | Admitting: Pharmacy Technician

## 2014-01-12 ENCOUNTER — Encounter (HOSPITAL_COMMUNITY): Payer: Self-pay | Admitting: *Deleted

## 2014-01-12 NOTE — Progress Notes (Signed)
PT'S MEDICAL HISTORY UPDATED IN EPIC WITH PT BY PHONE.  PT REQUESTED THAT HER CAREGIVER WRITE DOWN HER PREOP INSTRUCTIONS - PT HAS MS AND HAS DIFFICULTY WRITING.  PREOP INSTRUCTIONS AND CHLORHEXIDINE BEDBATHS / PRECAUTIONS GIVEN TO PT'S CAREGIVER.

## 2014-01-14 MED ORDER — GENTAMICIN SULFATE 40 MG/ML IJ SOLN
5.0000 mg/kg | INTRAVENOUS | Status: AC
  Administered 2014-01-15: 370 mg via INTRAVENOUS
  Filled 2014-01-14: qty 9.25

## 2014-01-15 ENCOUNTER — Encounter (HOSPITAL_COMMUNITY): Payer: Medicare Other | Admitting: Certified Registered Nurse Anesthetist

## 2014-01-15 ENCOUNTER — Ambulatory Visit (HOSPITAL_COMMUNITY)
Admission: RE | Admit: 2014-01-15 | Discharge: 2014-01-15 | Disposition: A | Discharge status: Home / Self Care | Payer: Medicare Other | Source: Ambulatory Visit | Attending: Urology | Admitting: Urology

## 2014-01-15 ENCOUNTER — Ambulatory Visit (HOSPITAL_COMMUNITY): Payer: Medicare Other | Admitting: Certified Registered Nurse Anesthetist

## 2014-01-15 ENCOUNTER — Encounter (HOSPITAL_COMMUNITY): Payer: Self-pay | Admitting: *Deleted

## 2014-01-15 ENCOUNTER — Encounter (HOSPITAL_COMMUNITY)
Admission: RE | Disposition: A | Discharge status: Home / Self Care | Payer: Self-pay | Source: Ambulatory Visit | Attending: Urology

## 2014-01-15 DIAGNOSIS — N3946 Mixed incontinence: Secondary | ICD-10-CM | POA: Insufficient documentation

## 2014-01-15 DIAGNOSIS — K219 Gastro-esophageal reflux disease without esophagitis: Secondary | ICD-10-CM | POA: Insufficient documentation

## 2014-01-15 DIAGNOSIS — F329 Major depressive disorder, single episode, unspecified: Secondary | ICD-10-CM | POA: Insufficient documentation

## 2014-01-15 DIAGNOSIS — G35 Multiple sclerosis: Secondary | ICD-10-CM | POA: Insufficient documentation

## 2014-01-15 DIAGNOSIS — E039 Hypothyroidism, unspecified: Secondary | ICD-10-CM | POA: Insufficient documentation

## 2014-01-15 DIAGNOSIS — N3942 Incontinence without sensory awareness: Secondary | ICD-10-CM | POA: Insufficient documentation

## 2014-01-15 DIAGNOSIS — R32 Unspecified urinary incontinence: Secondary | ICD-10-CM

## 2014-01-15 DIAGNOSIS — R339 Retention of urine, unspecified: Secondary | ICD-10-CM | POA: Insufficient documentation

## 2014-01-15 DIAGNOSIS — F411 Generalized anxiety disorder: Secondary | ICD-10-CM | POA: Insufficient documentation

## 2014-01-15 DIAGNOSIS — N3945 Continuous leakage: Secondary | ICD-10-CM | POA: Insufficient documentation

## 2014-01-15 DIAGNOSIS — F3289 Other specified depressive episodes: Secondary | ICD-10-CM | POA: Insufficient documentation

## 2014-01-15 DIAGNOSIS — Z880 Allergy status to penicillin: Secondary | ICD-10-CM | POA: Insufficient documentation

## 2014-01-15 DIAGNOSIS — Z7982 Long term (current) use of aspirin: Secondary | ICD-10-CM | POA: Insufficient documentation

## 2014-01-15 DIAGNOSIS — E119 Type 2 diabetes mellitus without complications: Secondary | ICD-10-CM | POA: Insufficient documentation

## 2014-01-15 HISTORY — PX: INSERTION OF SUPRAPUBIC CATHETER: SHX5870

## 2014-01-15 LAB — COMPREHENSIVE METABOLIC PANEL
ALT: 14 U/L (ref 0–35)
AST: 18 U/L (ref 0–37)
Albumin: 4 g/dL (ref 3.5–5.2)
Alkaline Phosphatase: 153 U/L — ABNORMAL HIGH (ref 39–117)
Anion gap: 13 (ref 5–15)
BUN: 17 mg/dL (ref 6–23)
CALCIUM: 9.9 mg/dL (ref 8.4–10.5)
CHLORIDE: 97 meq/L (ref 96–112)
CO2: 29 meq/L (ref 19–32)
Creatinine, Ser: 0.51 mg/dL (ref 0.50–1.10)
GFR calc Af Amer: >90 mL/min (ref >90)
Glucose, Bld: 107 mg/dL — ABNORMAL HIGH (ref 70–99)
Potassium: 3.9 mEq/L (ref 3.7–5.3)
SODIUM: 139 meq/L (ref 137–147)
Total Bilirubin: 0.5 mg/dL (ref 0.3–1.2)
Total Protein: 7.9 g/dL (ref 6.0–8.3)

## 2014-01-15 LAB — CBC
HCT: 44.4 % (ref 36.0–46.0)
HEMOGLOBIN: 14.5 g/dL (ref 12.0–15.0)
MCH: 29.4 pg (ref 26.0–34.0)
MCHC: 32.7 g/dL (ref 30.0–36.0)
MCV: 89.9 fL (ref 78.0–100.0)
Platelets: 358 10*3/uL (ref 150–400)
RBC: 4.94 MIL/uL (ref 3.87–5.11)
RDW: 13.4 % (ref 11.5–15.5)
WBC: 10 10*3/uL (ref 4.0–10.5)

## 2014-01-15 LAB — GLUCOSE, CAPILLARY
Glucose-Capillary: 99 mg/dL (ref 70–99)
Glucose-Capillary: 119 mg/dL — ABNORMAL HIGH (ref 70–99)

## 2014-01-15 SURGERY — INSERTION, SUPRAPUBIC CATHETER
Anesthesia: General

## 2014-01-15 MED ORDER — BELLADONNA ALKALOIDS-OPIUM 16.2-60 MG RE SUPP
RECTAL | Status: AC
  Filled 2014-01-15: qty 1

## 2014-01-15 MED ORDER — ACETAMINOPHEN 10 MG/ML IV SOLN
1000.0000 mg | Freq: Once | INTRAVENOUS | Status: AC
  Administered 2014-01-15: 1000 mg via INTRAVENOUS
  Filled 2014-01-15: qty 100

## 2014-01-15 MED ORDER — ONABOTULINUMTOXINA 100 UNITS IJ SOLR
300.0000 [IU] | Freq: Once | INTRAMUSCULAR | Status: AC
  Administered 2014-01-15: 200 [IU] via INTRAMUSCULAR
  Filled 2014-01-15: qty 300

## 2014-01-15 MED ORDER — FENTANYL CITRATE 0.05 MG/ML IJ SOLN
INTRAMUSCULAR | Status: AC
  Filled 2014-01-15: qty 2

## 2014-01-15 MED ORDER — MIDAZOLAM HCL 2 MG/2ML IJ SOLN
INTRAMUSCULAR | Status: AC
  Filled 2014-01-15: qty 2

## 2014-01-15 MED ORDER — ONDANSETRON HCL 4 MG/2ML IJ SOLN
INTRAMUSCULAR | Status: DC | PRN
  Administered 2014-01-15: 4 mg via INTRAVENOUS

## 2014-01-15 MED ORDER — LACTATED RINGERS IV SOLN
INTRAVENOUS | Status: DC
  Administered 2014-01-15: 1000 mL via INTRAVENOUS

## 2014-01-15 MED ORDER — ONDANSETRON HCL 4 MG/2ML IJ SOLN
INTRAMUSCULAR | Status: AC
  Filled 2014-01-15: qty 2

## 2014-01-15 MED ORDER — PROPOFOL 10 MG/ML IV BOLUS
INTRAVENOUS | Status: AC
  Filled 2014-01-15: qty 20

## 2014-01-15 MED ORDER — MIDAZOLAM HCL 5 MG/5ML IJ SOLN
INTRAMUSCULAR | Status: DC | PRN
  Administered 2014-01-15: 2 mg via INTRAVENOUS

## 2014-01-15 MED ORDER — LIDOCAINE HCL 1 % IJ SOLN
INTRAMUSCULAR | Status: DC | PRN
  Administered 2014-01-15: 10 mL

## 2014-01-15 MED ORDER — PROPOFOL 10 MG/ML IV BOLUS
INTRAVENOUS | Status: DC | PRN
  Administered 2014-01-15: 150 mg via INTRAVENOUS

## 2014-01-15 MED ORDER — LIDOCAINE HCL (CARDIAC) 20 MG/ML IV SOLN
INTRAVENOUS | Status: AC
  Filled 2014-01-15: qty 5

## 2014-01-15 MED ORDER — LIDOCAINE HCL 1 % IJ SOLN
INTRAMUSCULAR | Status: AC
  Filled 2014-01-15: qty 20

## 2014-01-15 MED ORDER — FENTANYL CITRATE 0.05 MG/ML IJ SOLN
INTRAMUSCULAR | Status: DC | PRN
  Administered 2014-01-15: 50 ug via INTRAVENOUS

## 2014-01-15 MED ORDER — SODIUM CHLORIDE 0.9 % IJ SOLN
INTRAMUSCULAR | Status: AC
  Filled 2014-01-15: qty 50

## 2014-01-15 MED ORDER — TRAMADOL-ACETAMINOPHEN 37.5-325 MG PO TABS: 1.0000 | ORAL_TABLET | Freq: Four times a day (QID) | ORAL | Status: DC | PRN

## 2014-01-15 MED ORDER — STERILE WATER FOR IRRIGATION IR SOLN
Status: DC | PRN
  Administered 2014-01-15: 3000 mL

## 2014-01-15 MED ORDER — LIDOCAINE HCL (CARDIAC) 20 MG/ML IV SOLN
INTRAVENOUS | Status: DC | PRN
  Administered 2014-01-15: 100 mg via INTRAVENOUS

## 2014-01-15 SURGICAL SUPPLY — 23 items
BAG URINE DRAINAGE (UROLOGICAL SUPPLIES) ×3 IMPLANT
BAG URINE LEG 500ML (DRAIN) ×3 IMPLANT
BLADE SURG 15 STRL LF DISP TIS (BLADE) ×1 IMPLANT
BLADE SURG 15 STRL SS (BLADE) ×3
CATH FOLEY 2WAY SLVR  5CC 20FR (CATHETERS) ×2
CATH FOLEY 2WAY SLVR 5CC 20FR (CATHETERS) IMPLANT
COVER SURGICAL LIGHT HANDLE (MISCELLANEOUS) ×6 IMPLANT
ELECT REM PT RETURN 9FT ADLT (ELECTROSURGICAL) ×3
ELECTRODE REM PT RTRN 9FT ADLT (ELECTROSURGICAL) ×1 IMPLANT
GLOVE SURG SS PI 8.0 STRL IVOR (GLOVE) ×3 IMPLANT
GOWN STRL REUS W/TWL XL LVL3 (GOWN DISPOSABLE) ×6 IMPLANT
KIT SUPRAPUBIC CATH (MISCELLANEOUS) ×1 IMPLANT
MANIFOLD NEPTUNE II (INSTRUMENTS) ×3 IMPLANT
NEEDLE HYPO 22GX1.5 SAFETY (NEEDLE) IMPLANT
NS IRRIG 1000ML POUR BTL (IV SOLUTION) ×3 IMPLANT
PACK CYSTO (CUSTOM PROCEDURE TRAY) ×3 IMPLANT
PENCIL BUTTON HOLSTER BLD 10FT (ELECTRODE) IMPLANT
PLUG CATH AND CAP STER (CATHETERS) ×2 IMPLANT
SUT ETHILON 3 0 PS 1 (SUTURE) ×1 IMPLANT
SUT VIC AB 3-0 SH 27 (SUTURE) ×6
SUT VIC AB 3-0 SH 27X BRD (SUTURE) IMPLANT
TOWEL OR 17X26 10 PK STRL BLUE (TOWEL DISPOSABLE) ×3 IMPLANT
WATER STERILE IRR 3000ML UROMA (IV SOLUTION) ×1 IMPLANT

## 2014-01-15 NOTE — Transfer of Care (Signed)
Immediate Anesthesia Transfer of Care Note  Patient: [name]  Procedure(s) Performed: Procedure(s) (LRB): CYSTOSCOPY WITH BOTOX INJECTION/SUPRAPUBIC TUBE PLACEMENT (N/A)  Patient Location: PACU  Anesthesia Type: General  Level of Consciousness: sedated, patient cooperative and responds to stimulation  Airway & Oxygen Therapy: Patient Spontanous Breathing and Patient connected to face mask oxgen  Post-op Assessment: Report given to PACU RN and Post -op Vital signs reviewed and stable  Post vital signs: Reviewed and stable  Complications: No apparent anesthesia complications

## 2014-01-15 NOTE — Op Note (Signed)
Pre-operative diagnosis :   Multiple sclerosis with chronic urinary incontinence  Postoperative diagnosis:  Same  Operation:  Cystourethroscopy, placement of suprapubic catheter (20 French-10 cc balloon; injection of Botox  sub- mucosal, 200 units  Surgeon:  S. Patsi Searsannenbaum, MD  First assistant:  None  Anesthesia:  General LMA  Preparation:  After appropriate preanesthesia, and after she does gentamicin antibiotic coverage, the patient was brought to the operating room, placed on the operating table in the dorsal supine position where general LMA anesthesia and was introduced. She was replaced in the dorsal lithotomy position with pubis and vagina were prepped with Betadine solution and draped in usual fashion. The arm band was double checked. The history was double checked.  Review history:  Problems  1. Multiple sclerosis (340)   Assessed By: Jethro Bolusannenbaum, Felipa Laroche (Urology); Last Assessed: 25 Nov 2013  2. Urge and stress incontinence (788.33)   Assessed By: Jethro Bolusannenbaum, Shaneka Efaw (Urology); Last Assessed: 25 Nov 2013  3. Urinary incontinence with continuous leakage (788.37)  4. Urinary incontinence without sensory awareness (788.34)  5. Urinary retention (788.20)  History of Present Illness  63 year old female with multiple sclerosis, with a history of chronic Foley catheterization complicated by chronic urinary tract infection. She returns today to review urodynamic results.  Foley catheter was removed 1 year ago, and since that time, the patient has complained of urge and urge incontinence. She wears a diaper constantly. The patient denies skin breakdown. She is considering having suprapubic tube placement. She did have cystostomy, and placement of 20 French silicone suprapubic tube in December 2012, but this was complicated by abscess of the suprapubic tube site. At that time, she was irrigating the suprapubic tube site twice weekly with acetic acid. Note her past medical history of  anxiety, depression, diabetes, and multiple sclerosis. She is wheelchair-bound. She is being considered for suprapubic tube and Botox bladder injection versus ileal loop.      Statement of  Likelihood of Success: Excellent. TIME-OUT observed.:  Procedure:  The bladder was completely deflated. Therefore, a Lowsley tractor was placed, and using a blue marking pen, area of suprapubic incision was outlined. 10 cc of 0.25 plain Marcaine were then injected into the suprapubic site. Using a 15 blade, cutdown was accomplished over the Lowsley, until the Lowsley was rocked into the wound. A 20 French Foley catheter was then passed through the bladder into the urethra, and the Lowsley was released. The Foley catheter was then retracted in the bladder, 10 cc placed in the balloon. Cystourethroscopy was accomplished using the 12 lens, in order to identify the Foley catheter with balloon inflated. It was snug against the bladder wall.  The catheter was irrigated, and irrigated easily. 2 3-0 Vicryl sutures were then used to place a sub-cutaneously, to stop subcutaneous bleeding, and 2 3-0 Vicryl sutures were placed through the skin and skin closure.  The Botox injection scope was then placed, and 200 units of Botox were then injected, in 1 cc increments. Following 21 cc injections, above the trigone, normal lateral portions of the bladder, the bladder was drained, and the patient was awakened, taken to recovery room in good condition.

## 2014-01-15 NOTE — H&P (Signed)
Reason For Visit F/u to review urodynamic results   Active Problems Problems  1. Multiple sclerosis (340)   Assessed By: Jethro Bolus (Urology); Last Assessed: 25 Nov 2013 2. Urge and stress incontinence (788.33)   Assessed By: Jethro Bolus (Urology); Last Assessed: 25 Nov 2013 3. Urinary incontinence with continuous leakage (788.37) 4. Urinary incontinence without sensory awareness (788.34) 5. Urinary retention (788.20)  History of Present Illness    63 year old female with multiple sclerosis, with a history of chronic Foley catheterization complicated by chronic urinary tract infection. She returns today to review urodynamic results.      Foley catheter was removed 1 year ago, and since that time, the patient has complained of urge and urge incontinence. She wears a diaper constantly. The patient denies skin breakdown. She is considering having suprapubic tube placement. She did have cystostomy, and placement of 20 French silicone suprapubic tube in December 2012, but this was complicated by abscess of the suprapubic tube site. At that time, she was irrigating the suprapubic tube site twice weekly with acetic acid. Note her past medical history of anxiety, depression, diabetes, and multiple sclerosis. She is wheelchair-bound. She is being considered for suprapubic tube and Botox bladder injection versus ileal loop.    Urodynamics is accomplished on 11/11/13, in the sitting position. The maximum cystometric capacity is 250 cc. The bladder is unstable, with first contraction occurring at 123 cc. Maximum unstable bladder contraction pressure is 11-21 cm of water with leakage. The patient can feel her bladder being filled.    The patient did not leak for leak point pressure determination, although cough and Valsalva were extremely weak.    Pressure flow study: The volume voided was approximately 150 cc, with maximum detrusor pressure of 21 cm of water. PVRs  approximately 100 cc. EMG shows increased external sphincter dyssynergia.    Study was performed with the patient and her wheelchair, therefore x-ray was not utilized. There was a large amount of artifact during the early part of the study. The patient appears to have a maximum capacity of 200-250 cc. No stress incontinence that is identified. Her cough and Valsalva are exceptionally weak. She does have mild instability. During filling, 3 separate involuntary voiding episodes were recorded. She did leak. Her detrusor leak point pressures ranged from 11-21 cm of water. EMG activity was increased. The patient did not empty well, leaving it up proximally 75-100 cc.   Past Medical History Problems  1. History of Anxiety (300.00) 2. History of Arthritis (V13.4) 3. History of depression (V11.8) 4. History of diabetes mellitus (V12.29) 5. History of esophageal reflux (V12.79) 6. History of hypothyroidism (V12.29) 7. History of Multiple sclerosis (340)  Surgical History Problems  1. History of Ankle Surgery 2. History of Bladder Cystotomy With Drainage 3. History of Cystourethroscopy With Irrigation And Evacuation Of Clots 4. History of Tonsillectomy 5. History of Wrist Surgery  Current Meds 1. Acetaminophen 500 MG Oral Tablet;  Therapy: (Recorded:30Oct2012) to Recorded 2. Amantadine HCl - 100 MG Oral Tablet; prn;  Therapy: (Recorded:12Mar2015) to Recorded 3. Aspirin 81 MG Oral Tablet;  Therapy: (Recorded:30Oct2012) to Recorded 4. Atorvastatin Calcium 20 MG Oral Tablet;  Therapy: 30Oct2012 to Recorded 5. Baclofen TABS;  Therapy: (Recorded:30Oct2012) to Recorded 6. Cranberry TABS;  Therapy: (Recorded:31Jan2013) to Recorded 7. Cyclobenzaprine HCl - 10 MG Oral Tablet;  Therapy: 21May2015 to Recorded 8. Enablex 7.5 MG Oral Tablet Extended Release 24 Hour;  Therapy: (Recorded:30Oct2012) to Recorded 9. Furosemide 20  MG Oral Tablet;  Therapy: 06Mar2012 to Recorded 10. Gabapentin 400 MG  Oral Capsule;   Therapy: 19Jul2013 to Recorded 11. Ibuprofen 800 MG Oral Tablet;   Therapy: (Recorded:30Oct2012) to Recorded 12. Levothyroxine Sodium 125 MCG Oral Tablet;   Therapy: 26Feb2014 to Recorded 13. Meclizine HCl - 25 MG Oral Tablet; prn;   Therapy: 04Jan2013 to Recorded 14. Metamucil CAPS;   Therapy: (Recorded:31Jan2013) to Recorded 15. Multi-Day Vitamins TABS;   Therapy: (Recorded:30Oct2012) to Recorded 16. NexIUM 40 MG Oral Capsule Delayed Release;   Therapy: (Recorded:30Oct2012) to Recorded 17. Nitrofurantoin 50 MG CAPS; Pt takes 50 mg q day for supression.  Rx   prescribed by Dr. Redmond School;   Therapy: (Recorded:30Apr2015) to Recorded 18. Sertraline HCl - 25 MG Oral Tablet;   Therapy: 23Oct2012 to Recorded  Allergies Medication  1. Avelox TABS 2. Cipro TABS 3. Penicillins 4. Trimethoprim TABS  Family History Problems  1. Family history of Death In The Family Mother : Father   27 yo COPD 2. Family history of Family Health Status - Father's Age : Father 3. Family history of Hypertension (V17.49) : Father 4. Family history of Type 2 Diabetes Mellitus : Father 5. Family history of Urinary Calculus : Father  Social History Problems  1. Denied: History of Alcohol Use 2. Denied: History of Caffeine Use 3. Marital History - Currently Married 4. Never A Smoker 5. Physical Disability Affecting Ability To Work  Review of Systems Genitourinary, constitutional, skin, eye, otolaryngeal, hematologic/lymphatic, cardiovascular, pulmonary, endocrine, musculoskeletal, gastrointestinal, neurological and psychiatric system(s) were reviewed and pertinent findings if present are noted.  Genitourinary: incontinence.    Vitals Vital Signs [Data Includes: Last 1 Day]  Recorded: 27May2015 11:21AM  Blood Pressure: 138 / 64 Temperature: 97.7 F Heart Rate: 94  Physical Exam Constitutional: Well nourished . No acute distress. The patient appears well hydrated.  ENT:. Examination  of the teeth show poor dentition.  Neck: The appearance of the neck is normal.  Pulmonary: normal respiratory rhythm and effort.  Cardiovascular:. No peripheral edema.  Abdomen: periumbilical incision site(s) well healed. The abdomen is obese. The abdomen is soft and nontender. No masses are palpated. No CVA tenderness. No hernias are palpable. No hepatosplenomegaly noted.  Lymphatics: The femoral and inguinal nodes are not enlarged or tender.  Skin: Normal skin turgor, no visible rash and no visible skin lesions.  Neuro/Psych:. Mood and affect are appropriate.    Results/Data  Old records or history reviewed:Marland Kitchen  The following images/tracing/specimen were independently visualized: Marland Kitchen  The following clinical lab reports were reviewed: Marland Kitchen  Will request old records/history:.  Discussed: .    Assessment Assessed  1. Multiple sclerosis (340) 2. Urinary incontinence with continuous leakage (788.37) 3. Urinary incontinence without sensory awareness (788.34) 4. Urge and stress incontinence (788.33)  63 yo female with MS and OAB and urge incontinence. We have reviewed her videourodynamics, and reviewed the possibilities of Botox and repeat s-p tube ; vs ileal loop urinary diversion. She is not going to be able to self cath. She is attended during the day time by nursing staff, and has separate attendant at night. She has no hydronephrosis. ( but needs renal u/s) .    We have discussed the pros and cons of each treatment, including the benefits and possible complications of Botox, with possible absorption and paralysis of the diaphragm. In addition we have discussed the problem of anesthesia and ileal loop urinary diversion also.    In the end, she believes that the s-p tube and  Botox represents less risk than ileal loop surgery-and that , if it fails, that she could go back and have ileal loop surgery.    Not that she has had s-p abscess in the past. No latex allergy.   Plan Multiple sclerosis,  Urge and stress incontinence, Urinary incontinence with continuous leakage, Urinary incontinence without sensory awareness  1. Follow-up Schedule Surgery Office  Follow-up  Status: Hold For -  Appointment  Requested for: 27May2015 Urge and stress incontinence  2. RENAL U/S COMPLETE; Status:Hold For - Appointment,Date of Service;  Requested for:27May2015;   Arrange cysto, Botox injection and s-p tube placement. Gentamycin pre-op. May need gent. bladder wash in future. ck cr. pre-op.   Will need upper tract evaluation in future to be sure she doesn't develop hydronephrosis.   Signatures Electronically signed by : Jethro BolusSigmund Kabao Leite, M.D.; Nov 25 2013 12:02PM EST

## 2014-01-15 NOTE — Anesthesia Postprocedure Evaluation (Signed)
Anesthesia Post Note  Patient: [name]  Procedure(s) Performed: Procedure(s) (LRB): CYSTOSCOPY WITH BOTOX INJECTION/SUPRAPUBIC TUBE PLACEMENT (N/A)  Anesthesia type: General  Patient location: PACU  Post pain: Pain level controlled  Post assessment: Post-op Vital signs reviewed  Last Vitals: BP 131/83  Pulse 90  Temp(Src) 36.3 C (Oral)  Resp 16  Ht 5\' 2"  (1.575 m)  Wt 148 lb (67.132 kg)  BMI 27.06 kg/m2  SpO2 100%  Post vital signs: Reviewed  Level of consciousness: sedated  Complications: No apparent anesthesia complications

## 2014-01-15 NOTE — Interval H&P Note (Signed)
History and Physical Interval Note:  01/15/2014 8:42 AM  [name]  has presented today for surgery, with the diagnosis of neurogenic bladder  The various methods of treatment have been discussed with the patient and family. After consideration of risks, benefits and other options for treatment, the patient has consented to  Procedure(s): CYSTOSCOPY WITH BOTOX INJECTION/SUPRAPUBIC TUBE PLACEMENT (N/A) as a surgical intervention .  The patient's history has been reviewed, patient examined, no change in status, stable for surgery.  I have reviewed the patient's chart and labs.  Questions were answered to the patient's satisfaction.     Jethro BolusANNENBAUM, Lafreda Casebeer I

## 2014-01-15 NOTE — Anesthesia Preprocedure Evaluation (Addendum)
Anesthesia Evaluation  Patient identified by MRN, date of birth, ID band Patient awake    Reviewed: Allergy & Precautions, H&P , NPO status , Patient's Chart, lab work & pertinent test results  Airway Mallampati: II TM Distance: >3 FB Neck ROM: Full    Dental  (+) Dental Advisory Given   Pulmonary neg pulmonary ROS,  breath sounds clear to auscultation        Cardiovascular negative cardio ROS  Rhythm:Regular Rate:Normal     Neuro/Psych PSYCHIATRIC DISORDERS Anxiety MS negative neurological ROS     GI/Hepatic Neg liver ROS, GERD-  Medicated,  Endo/Other  diabetes, Type 2Hypothyroidism   Renal/GU negative Renal ROS     Musculoskeletal negative musculoskeletal ROS (+)   Abdominal (+) + obese,   Peds  Hematology negative hematology ROS (+)   Anesthesia Other Findings   Reproductive/Obstetrics negative OB ROS                          Anesthesia Physical Anesthesia Plan  ASA: III  Anesthesia Plan: General   Post-op Pain Management:    Induction: Intravenous  Airway Management Planned: LMA  Additional Equipment:   Intra-op Plan:   Post-operative Plan: Extubation in OR  Informed Consent: I have reviewed the patients History and Physical, chart, labs and discussed the procedure including the risks, benefits and alternatives for the proposed anesthesia with the patient or authorized representative who has indicated his/her understanding and acceptance.   Dental advisory given  Plan Discussed with: CRNA  Anesthesia Plan Comments:         Anesthesia Quick Evaluation                                  Anesthesia Evaluation  Patient identified by MRN, date of birth, ID band Patient awake    Reviewed: Allergy & Precautions, H&P , NPO status , Patient's Chart, lab work & pertinent test results  Airway Mallampati: II TM Distance: >3 FB Neck ROM: Full    Dental No notable  dental hx.    Pulmonary neg pulmonary ROS,  clear to auscultation  Pulmonary exam normal       Cardiovascular neg cardio ROS Regular Normal    Neuro/Psych PSYCHIATRIC DISORDERS Anxiety  Neuromuscular disease (Multiple sclerosis. bedridden) Negative Neurological ROS  Negative Psych ROS   GI/Hepatic negative GI ROS, Neg liver ROS, GERD-  ,  Endo/Other  Negative Endocrine ROSHypothyroidism   Renal/GU negative Renal ROS  Genitourinary negative   Musculoskeletal negative musculoskeletal ROS (+)   Abdominal   Peds negative pediatric ROS (+)  Hematology negative hematology ROS (+)   Anesthesia Other Findings   Reproductive/Obstetrics negative OB ROS                          Anesthesia Physical Anesthesia Plan  ASA: II  Anesthesia Plan: General   Post-op Pain Management:    Induction: Intravenous  Airway Management Planned: LMA  Additional Equipment:   Intra-op Plan:   Post-operative Plan: Extubation in OR  Informed Consent: I have reviewed the patients History and Physical, chart, labs and discussed the procedure including the risks, benefits and alternatives for the proposed anesthesia with the patient or authorized representative who has indicated his/her understanding and acceptance.   Dental advisory given  Plan Discussed with: CRNA  Anesthesia Plan Comments:  Anesthesia Quick Evaluation

## 2014-01-15 NOTE — Progress Notes (Signed)
Supra pubic catheter connected to drainage bag. Clear amber urine.  Supra pubic catheter care instruction sheets given to patient to give to her care givers

## 2014-01-15 NOTE — Discharge Instructions (Signed)
CYSTOSCOPY HOME CARE INSTRUCTIONS ° °Activity: °Rest for the remainder of the day.  Do not drive or operate equipment today.  You may resume normal activities in one to two days as instructed by your physician.  ° °Meals: °Drink plenty of liquids and eat light foods such as gelatin or soup this evening.  You may return to a normal meal plan tomorrow. ° °Return to Work: °You may return to work in one to two days or as instructed by your physician. ° °Special Instructions / Symptoms: °Call your physician if any of these symptoms occur: ° ° -persistent or heavy bleeding ° -bleeding which continues after first few urination ° -large blood clots that are difficult to pass ° -urine stream diminishes or stops completely ° -fever equal to or higher than 101 degrees Farenheit. ° -cloudy urine with a strong, foul odor ° -severe pain ° °Females should always wipe from front to back after elimination.  You may feel some burning pain when you urinate.  This should disappear with time.  Applying moist heat to the lower abdomen or a hot tub bath may help relieve the pain. \ ° °Follow-Up / Date of Return Visit to Your Physician:   °Call for an appointment to arrange follow-up. ° °Patient Signature:  ________________________________________________________ ° °Nurse's Signature:  ________________________________________________________ ° °

## 2014-01-19 ENCOUNTER — Encounter (HOSPITAL_COMMUNITY): Payer: Self-pay | Admitting: Urology

## 2014-02-17 ENCOUNTER — Inpatient Hospital Stay (HOSPITAL_COMMUNITY)
Admission: EM | Admit: 2014-02-17 | Discharge: 2014-02-24 | DRG: 388 | Disposition: A | Discharge status: Home / Self Care | Payer: Medicare Other | Attending: Internal Medicine | Admitting: Internal Medicine

## 2014-02-17 ENCOUNTER — Emergency Department (HOSPITAL_COMMUNITY): Payer: Medicare Other

## 2014-02-17 ENCOUNTER — Encounter (HOSPITAL_COMMUNITY): Payer: Self-pay | Admitting: Emergency Medicine

## 2014-02-17 DIAGNOSIS — E1142 Type 2 diabetes mellitus with diabetic polyneuropathy: Secondary | ICD-10-CM | POA: Diagnosis present

## 2014-02-17 DIAGNOSIS — N390 Urinary tract infection, site not specified: Secondary | ICD-10-CM | POA: Diagnosis present

## 2014-02-17 DIAGNOSIS — R29898 Other symptoms and signs involving the musculoskeletal system: Secondary | ICD-10-CM | POA: Diagnosis present

## 2014-02-17 DIAGNOSIS — N319 Neuromuscular dysfunction of bladder, unspecified: Secondary | ICD-10-CM | POA: Diagnosis present

## 2014-02-17 DIAGNOSIS — Y846 Urinary catheterization as the cause of abnormal reaction of the patient, or of later complication, without mention of misadventure at the time of the procedure: Secondary | ICD-10-CM | POA: Diagnosis present

## 2014-02-17 DIAGNOSIS — D72829 Elevated white blood cell count, unspecified: Secondary | ICD-10-CM | POA: Diagnosis present

## 2014-02-17 DIAGNOSIS — Z9089 Acquired absence of other organs: Secondary | ICD-10-CM

## 2014-02-17 DIAGNOSIS — K56609 Unspecified intestinal obstruction, unspecified as to partial versus complete obstruction: Principal | ICD-10-CM | POA: Diagnosis present

## 2014-02-17 DIAGNOSIS — M129 Arthropathy, unspecified: Secondary | ICD-10-CM | POA: Diagnosis present

## 2014-02-17 DIAGNOSIS — E1149 Type 2 diabetes mellitus with other diabetic neurological complication: Secondary | ICD-10-CM | POA: Diagnosis present

## 2014-02-17 DIAGNOSIS — Z7982 Long term (current) use of aspirin: Secondary | ICD-10-CM

## 2014-02-17 DIAGNOSIS — E785 Hyperlipidemia, unspecified: Secondary | ICD-10-CM | POA: Diagnosis present

## 2014-02-17 DIAGNOSIS — Z87440 Personal history of urinary (tract) infections: Secondary | ICD-10-CM | POA: Diagnosis not present

## 2014-02-17 DIAGNOSIS — E7800 Pure hypercholesterolemia, unspecified: Secondary | ICD-10-CM | POA: Diagnosis present

## 2014-02-17 DIAGNOSIS — E039 Hypothyroidism, unspecified: Secondary | ICD-10-CM | POA: Diagnosis present

## 2014-02-17 DIAGNOSIS — N179 Acute kidney failure, unspecified: Secondary | ICD-10-CM | POA: Diagnosis present

## 2014-02-17 DIAGNOSIS — T8389XA Other specified complication of genitourinary prosthetic devices, implants and grafts, initial encounter: Secondary | ICD-10-CM | POA: Diagnosis present

## 2014-02-17 DIAGNOSIS — G35D Multiple sclerosis, unspecified: Secondary | ICD-10-CM | POA: Diagnosis present

## 2014-02-17 DIAGNOSIS — Z6827 Body mass index (BMI) 27.0-27.9, adult: Secondary | ICD-10-CM

## 2014-02-17 DIAGNOSIS — Z79899 Other long term (current) drug therapy: Secondary | ICD-10-CM | POA: Diagnosis not present

## 2014-02-17 DIAGNOSIS — R532 Functional quadriplegia: Secondary | ICD-10-CM | POA: Diagnosis present

## 2014-02-17 DIAGNOSIS — T83010A Breakdown (mechanical) of cystostomy catheter, initial encounter: Secondary | ICD-10-CM

## 2014-02-17 DIAGNOSIS — L8992 Pressure ulcer of unspecified site, stage 2: Secondary | ICD-10-CM | POA: Diagnosis present

## 2014-02-17 DIAGNOSIS — Z880 Allergy status to penicillin: Secondary | ICD-10-CM | POA: Diagnosis not present

## 2014-02-17 DIAGNOSIS — L89109 Pressure ulcer of unspecified part of back, unspecified stage: Secondary | ICD-10-CM | POA: Diagnosis present

## 2014-02-17 DIAGNOSIS — K219 Gastro-esophageal reflux disease without esophagitis: Secondary | ICD-10-CM | POA: Diagnosis present

## 2014-02-17 DIAGNOSIS — Z8249 Family history of ischemic heart disease and other diseases of the circulatory system: Secondary | ICD-10-CM

## 2014-02-17 DIAGNOSIS — Z993 Dependence on wheelchair: Secondary | ICD-10-CM

## 2014-02-17 DIAGNOSIS — Z91013 Allergy to seafood: Secondary | ICD-10-CM | POA: Diagnosis not present

## 2014-02-17 DIAGNOSIS — G35 Multiple sclerosis: Secondary | ICD-10-CM | POA: Diagnosis present

## 2014-02-17 DIAGNOSIS — H5440 Blindness, one eye, unspecified eye: Secondary | ICD-10-CM | POA: Diagnosis present

## 2014-02-17 DIAGNOSIS — E876 Hypokalemia: Secondary | ICD-10-CM | POA: Diagnosis present

## 2014-02-17 DIAGNOSIS — Z881 Allergy status to other antibiotic agents status: Secondary | ICD-10-CM | POA: Diagnosis not present

## 2014-02-17 DIAGNOSIS — R32 Unspecified urinary incontinence: Secondary | ICD-10-CM | POA: Diagnosis present

## 2014-02-17 DIAGNOSIS — E669 Obesity, unspecified: Secondary | ICD-10-CM | POA: Diagnosis present

## 2014-02-17 DIAGNOSIS — E119 Type 2 diabetes mellitus without complications: Secondary | ICD-10-CM

## 2014-02-17 DIAGNOSIS — Z7401 Bed confinement status: Secondary | ICD-10-CM

## 2014-02-17 DIAGNOSIS — M792 Neuralgia and neuritis, unspecified: Secondary | ICD-10-CM | POA: Diagnosis present

## 2014-02-17 DIAGNOSIS — R1084 Generalized abdominal pain: Secondary | ICD-10-CM | POA: Diagnosis present

## 2014-02-17 DIAGNOSIS — N3000 Acute cystitis without hematuria: Secondary | ICD-10-CM

## 2014-02-17 DIAGNOSIS — L89152 Pressure ulcer of sacral region, stage 2: Secondary | ICD-10-CM

## 2014-02-17 LAB — URINALYSIS, ROUTINE W REFLEX MICROSCOPIC
Bilirubin Urine: NEGATIVE
Glucose, UA: NEGATIVE mg/dL
Ketones, ur: NEGATIVE mg/dL
Nitrite: NEGATIVE
Protein, ur: >300 mg/dL — AB
Specific Gravity, Urine: 1.025 (ref 1.005–1.030)
Urobilinogen, UA: 0.2 mg/dL (ref 0.0–1.0)
pH: 6 (ref 5.0–8.0)

## 2014-02-17 LAB — CBC WITH DIFFERENTIAL/PLATELET
Basophils Absolute: 0 10*3/uL (ref 0.0–0.1)
Basophils Absolute: 0 10*3/uL (ref 0.0–0.1)
Basophils Relative: 0 % (ref 0–1)
Basophils Relative: 0 % (ref 0–1)
EOS ABS: 0.1 10*3/uL (ref 0.0–0.7)
Eosinophils Absolute: 0.1 10*3/uL (ref 0.0–0.7)
Eosinophils Relative: 0 % (ref 0–5)
Eosinophils Relative: 0 % (ref 0–5)
HCT: 49.9 % — ABNORMAL HIGH (ref 36.0–46.0)
HCT: 48.5 % — ABNORMAL HIGH (ref 36.0–46.0)
HEMOGLOBIN: 16.7 g/dL — AB (ref 12.0–15.0)
Hemoglobin: 16.7 g/dL — ABNORMAL HIGH (ref 12.0–15.0)
LYMPHS ABS: 1.9 10*3/uL (ref 0.7–4.0)
LYMPHS PCT: 9 % — AB (ref 12–46)
Lymphocytes Relative: 8 % — ABNORMAL LOW (ref 12–46)
Lymphs Abs: 1.7 10*3/uL (ref 0.7–4.0)
MCH: 29.5 pg (ref 26.0–34.0)
MCH: 29.8 pg (ref 26.0–34.0)
MCHC: 33.5 g/dL (ref 30.0–36.0)
MCHC: 34.4 g/dL (ref 30.0–36.0)
MCV: 88 fL (ref 78.0–100.0)
MCV: 86.5 fL (ref 78.0–100.0)
MONOS PCT: 5 % (ref 3–12)
Monocytes Absolute: 1.3 10*3/uL — ABNORMAL HIGH (ref 0.1–1.0)
Monocytes Absolute: 1 10*3/uL (ref 0.1–1.0)
Monocytes Relative: 6 % (ref 3–12)
NEUTROS ABS: 18.1 10*3/uL — AB (ref 1.7–7.7)
NEUTROS PCT: 86 % — AB (ref 43–77)
Neutro Abs: 19.6 10*3/uL — ABNORMAL HIGH (ref 1.7–7.7)
Neutrophils Relative %: 86 % — ABNORMAL HIGH (ref 43–77)
PLATELETS: 412 10*3/uL — AB (ref 150–400)
Platelets: 397 10*3/uL (ref 150–400)
RBC: 5.67 MIL/uL — ABNORMAL HIGH (ref 3.87–5.11)
RBC: 5.61 MIL/uL — AB (ref 3.87–5.11)
RDW: 13.9 % (ref 11.5–15.5)
RDW: 13.9 % (ref 11.5–15.5)
WBC: 22.6 10*3/uL — ABNORMAL HIGH (ref 4.0–10.5)
WBC: 21.1 10*3/uL — AB (ref 4.0–10.5)

## 2014-02-17 LAB — COMPREHENSIVE METABOLIC PANEL
ALT: 16 U/L (ref 0–35)
AST: 19 U/L (ref 0–37)
Albumin: 3.8 g/dL (ref 3.5–5.2)
Alkaline Phosphatase: 169 U/L — ABNORMAL HIGH (ref 39–117)
Anion gap: 17 — ABNORMAL HIGH (ref 5–15)
BUN: 22 mg/dL (ref 6–23)
CO2: 25 mEq/L (ref 19–32)
Calcium: 10.3 mg/dL (ref 8.4–10.5)
Chloride: 93 mEq/L — ABNORMAL LOW (ref 96–112)
Creatinine, Ser: 1.06 mg/dL (ref 0.50–1.10)
GFR calc Af Amer: 64 mL/min — ABNORMAL LOW (ref >90)
GFR calc non Af Amer: 55 mL/min — ABNORMAL LOW (ref >90)
Glucose, Bld: 128 mg/dL — ABNORMAL HIGH (ref 70–99)
Potassium: 4.9 mEq/L (ref 3.7–5.3)
Sodium: 135 mEq/L — ABNORMAL LOW (ref 137–147)
Total Bilirubin: 0.8 mg/dL (ref 0.3–1.2)
Total Protein: 7.9 g/dL (ref 6.0–8.3)

## 2014-02-17 LAB — URINE MICROSCOPIC-ADD ON

## 2014-02-17 LAB — APTT: APTT: 38 s — AB (ref 24–37)

## 2014-02-17 LAB — PROTIME-INR
INR: 1.05 (ref 0.00–1.49)
PROTHROMBIN TIME: 13.7 s (ref 11.6–15.2)

## 2014-02-17 MED ORDER — ONDANSETRON HCL 4 MG PO TABS: 4.0000 mg | ORAL_TABLET | Freq: Four times a day (QID) | ORAL | Status: DC | PRN

## 2014-02-17 MED ORDER — ASPIRIN EC 81 MG PO TBEC
81.0000 mg | DELAYED_RELEASE_TABLET | Freq: Every day | ORAL | Status: DC
  Administered 2014-02-18 – 2014-02-20 (×3): 81 mg via ORAL
  Filled 2014-02-17 (×3): qty 1

## 2014-02-17 MED ORDER — ADULT MULTIVITAMIN W/MINERALS CH
1.0000 | ORAL_TABLET | Freq: Every day | ORAL | Status: DC
  Administered 2014-02-18 – 2014-02-20 (×3): 1 via ORAL
  Filled 2014-02-17 (×3): qty 1

## 2014-02-17 MED ORDER — NYSTATIN 100000 UNIT/GM EX POWD
1.0000 | Freq: Two times a day (BID) | CUTANEOUS | Status: DC
  Administered 2014-02-18 – 2014-02-24 (×14): 1 via TOPICAL
  Filled 2014-02-17: qty 15

## 2014-02-17 MED ORDER — PANTOPRAZOLE SODIUM 40 MG IV SOLR
40.0000 mg | Freq: Two times a day (BID) | INTRAVENOUS | Status: DC
  Administered 2014-02-18 – 2014-02-23 (×13): 40 mg via INTRAVENOUS
  Filled 2014-02-17 (×15): qty 40

## 2014-02-17 MED ORDER — MECLIZINE HCL 25 MG PO TABS
25.0000 mg | ORAL_TABLET | Freq: Three times a day (TID) | ORAL | Status: DC | PRN
  Filled 2014-02-17: qty 1

## 2014-02-17 MED ORDER — LEVOTHYROXINE SODIUM 125 MCG PO TABS
125.0000 ug | ORAL_TABLET | Freq: Every day | ORAL | Status: DC
  Administered 2014-02-18 – 2014-02-20 (×3): 125 ug via ORAL
  Filled 2014-02-17 (×4): qty 1

## 2014-02-17 MED ORDER — CYCLOBENZAPRINE HCL 5 MG PO TABS
5.0000 mg | ORAL_TABLET | Freq: Three times a day (TID) | ORAL | Status: DC
  Administered 2014-02-18 – 2014-02-19 (×6): 5 mg via ORAL
  Administered 2014-02-20: 09:00:00 via ORAL
  Administered 2014-02-20 – 2014-02-24 (×13): 5 mg via ORAL
  Filled 2014-02-17 (×23): qty 1

## 2014-02-17 MED ORDER — ACETAMINOPHEN 500 MG PO TABS: 1000.0000 mg | ORAL_TABLET | Freq: Every evening | ORAL | Status: DC | PRN

## 2014-02-17 MED ORDER — INSULIN ASPART 100 UNIT/ML ~~LOC~~ SOLN
0.0000 [IU] | Freq: Three times a day (TID) | SUBCUTANEOUS | Status: DC
Start: 2014-02-18 — End: 2014-02-24

## 2014-02-17 MED ORDER — DEXTROSE 5 % IV SOLN
1.0000 g | Freq: Two times a day (BID) | INTRAVENOUS | Status: DC
  Administered 2014-02-18 – 2014-02-20 (×5): 1 g via INTRAVENOUS
  Filled 2014-02-17 (×5): qty 1

## 2014-02-17 MED ORDER — SODIUM CHLORIDE 0.9 % IV SOLN
INTRAVENOUS | Status: AC
  Administered 2014-02-17 – 2014-02-18 (×2): via INTRAVENOUS

## 2014-02-17 MED ORDER — CYCLOBENZAPRINE HCL 10 MG PO TABS
10.0000 mg | ORAL_TABLET | Freq: Every day | ORAL | Status: DC
  Administered 2014-02-18 – 2014-02-23 (×7): 10 mg via ORAL
  Filled 2014-02-17 (×9): qty 1

## 2014-02-17 MED ORDER — DOCUSATE SODIUM 100 MG PO CAPS
100.0000 mg | ORAL_CAPSULE | Freq: Every day | ORAL | Status: DC | PRN
  Filled 2014-02-17: qty 1

## 2014-02-17 MED ORDER — ONDANSETRON HCL 4 MG/2ML IJ SOLN
4.0000 mg | Freq: Once | INTRAMUSCULAR | Status: AC
  Administered 2014-02-17: 4 mg via INTRAVENOUS
  Filled 2014-02-17: qty 2

## 2014-02-17 MED ORDER — MUSCLE RUB 10-15 % EX CREA
TOPICAL_CREAM | CUTANEOUS | Status: DC | PRN
  Administered 2014-02-18: 1 via TOPICAL
  Filled 2014-02-17: qty 85

## 2014-02-17 MED ORDER — TROLAMINE SALICYLATE 10 % EX CREA
1.0000 "application " | TOPICAL_CREAM | CUTANEOUS | Status: DC | PRN
  Filled 2014-02-17: qty 85

## 2014-02-17 MED ORDER — SIMETHICONE 80 MG PO CHEW
80.0000 mg | CHEWABLE_TABLET | Freq: Four times a day (QID) | ORAL | Status: DC | PRN
  Filled 2014-02-17: qty 1

## 2014-02-17 MED ORDER — TRAMADOL-ACETAMINOPHEN 37.5-325 MG PO TABS
1.0000 | ORAL_TABLET | Freq: Four times a day (QID) | ORAL | Status: DC | PRN
  Administered 2014-02-18: 1 via ORAL
  Filled 2014-02-17: qty 1

## 2014-02-17 MED ORDER — AQUAPHOR EX OINT
1.0000 "application " | TOPICAL_OINTMENT | CUTANEOUS | Status: DC | PRN
  Administered 2014-02-18: 1 via TOPICAL
  Filled 2014-02-17: qty 50

## 2014-02-17 MED ORDER — DARIFENACIN HYDROBROMIDE ER 7.5 MG PO TB24
7.5000 mg | ORAL_TABLET | Freq: Two times a day (BID) | ORAL | Status: DC
  Administered 2014-02-18 – 2014-02-24 (×14): 7.5 mg via ORAL
  Filled 2014-02-17 (×16): qty 1

## 2014-02-17 MED ORDER — ZINC OXIDE 40 % EX OINT
1.0000 "application " | TOPICAL_OINTMENT | CUTANEOUS | Status: DC | PRN
  Administered 2014-02-18: 1 via TOPICAL
  Filled 2014-02-17: qty 114

## 2014-02-17 MED ORDER — ATORVASTATIN CALCIUM 20 MG PO TABS
20.0000 mg | ORAL_TABLET | Freq: Every day | ORAL | Status: DC
  Administered 2014-02-18 – 2014-02-19 (×3): 20 mg via ORAL
  Filled 2014-02-17 (×4): qty 1

## 2014-02-17 MED ORDER — MORPHINE SULFATE 2 MG/ML IJ SOLN
1.0000 mg | INTRAMUSCULAR | Status: DC | PRN
  Administered 2014-02-17 – 2014-02-21 (×3): 1 mg via INTRAVENOUS
  Filled 2014-02-17 (×3): qty 1

## 2014-02-17 MED ORDER — ONDANSETRON HCL 4 MG/2ML IJ SOLN
4.0000 mg | Freq: Four times a day (QID) | INTRAMUSCULAR | Status: DC | PRN
  Administered 2014-02-18: 4 mg via INTRAVENOUS
  Filled 2014-02-17: qty 2

## 2014-02-17 MED ORDER — IOHEXOL 300 MG/ML  SOLN
100.0000 mL | Freq: Once | INTRAMUSCULAR | Status: AC | PRN
  Administered 2014-02-17: 100 mL via INTRAVENOUS

## 2014-02-17 MED ORDER — IOHEXOL 300 MG/ML  SOLN
50.0000 mL | Freq: Once | INTRAMUSCULAR | Status: AC | PRN
  Administered 2014-02-17: 50 mL via ORAL

## 2014-02-17 MED ORDER — DIPHENHYDRAMINE HCL 25 MG PO CAPS: 50.0000 mg | ORAL_CAPSULE | Freq: Four times a day (QID) | ORAL | Status: DC | PRN

## 2014-02-17 MED ORDER — DEXTROSE 5 % IV SOLN
1.0000 g | INTRAVENOUS | Status: AC
  Filled 2014-02-17: qty 1

## 2014-02-17 MED ORDER — ENOXAPARIN SODIUM 40 MG/0.4ML ~~LOC~~ SOLN
40.0000 mg | Freq: Every day | SUBCUTANEOUS | Status: DC
  Administered 2014-02-18 – 2014-02-23 (×7): 40 mg via SUBCUTANEOUS
  Filled 2014-02-17 (×8): qty 0.4

## 2014-02-17 MED ORDER — GABAPENTIN 400 MG PO CAPS
800.0000 mg | ORAL_CAPSULE | Freq: Four times a day (QID) | ORAL | Status: DC
  Administered 2014-02-18 – 2014-02-24 (×27): 800 mg via ORAL
  Filled 2014-02-17 (×30): qty 2

## 2014-02-17 NOTE — ED Notes (Signed)
Per EMS pt coming from home with c/o abdominal pain and distention since yesterday, sts LBM 8/15, reports nausea and vomiting. Pt has hx of MS and BLE weakness.

## 2014-02-17 NOTE — Consult Note (Signed)
Reason for Consult:SBO Referring Physician: Virgel Manifold MD  [name] is an 63 y.o. female.  HPI: Pt presents with 1 day hx of N/V and diffuse crampy abdominal pain.  Moderate to severe ,  Diffuse and crampy.  No BM in last 24 hours.  Has severe MS and is bedridden.  CT shows SBO.  Hx of suprapubic tube per urology. Had cholecystectomy as other abdominal operation.   Past Medical History  Diagnosis Date  . Urinary incontinence     neurogenic bladder--hx of uti's  . Thyroid disease   . Multiple sclerosis     pt is bedridden -does get up to recliner by hoyer lift; mentally alert, able to feed herself -no trouble swallowing. lives at home with husband and has 24 hour caregivers.  . Hypothyroidism   . Anxiety   . Blindness of right eye     complication of ms  . Cellulitis     hx of cellulitis in lower extremities -requiring multiple hospitalzations in the past--no problems in las 2 yrs  . Bedridden 09/02/2013  . High cholesterol   . Arthritis     old falls-with injuries to rt knee and both ankles and rt wrist--states she has some pain in these areas-probable arthritis  . Arthritis     "hands" (09/03/2013)  . UTI (lower urinary tract infection)   . Type II diabetes mellitus     diet control - no longer takes any diabetic meds  . GERD (gastroesophageal reflux disease)     nexium takes care  . Neuromuscular disorder     ms-not ambulatory-muscle spasms-and recent tremors left arm and left  leg have developed--dr. Bjorn Loser in [place] is pt's neurologist    Past Surgical History  Procedure Laterality Date  . Cholecystectomy    . Tonsillectomy    . Orif ankle fracture Left 2005  . Wrist surgery Right   . Lymph gland excision    . Mediastinoscopy  04/24/10    for enlarged right paratracheal lymph node--surgery at [facility]-Dr. Burney--pt states she does not know the  what the pathology report showed.  . Adenoidectomy    . Fracture surgery    . Cystoscopy  07/02/2011   Procedure: CYSTOSCOPY;  Surgeon: Ailene Rud, MD;  Location: [facility];  Service: Urology;  Laterality: N/A;  . Insertion of suprapubic catheter N/A 01/15/2014    Procedure: CYSTOSCOPY WITH BOTOX INJECTION/SUPRAPUBIC TUBE PLACEMENT;  Surgeon: Ailene Rud, MD;  Location: [facility];  Service: Urology;  Laterality: N/A;    History reviewed. No pertinent family history.  Social History:  reports that she has never smoked. She has never used smokeless tobacco. She reports that she does not drink alcohol or use illicit drugs.  Allergies:  Allergies  Allergen Reactions  . Avelox [Moxifloxacin Hcl In Nacl] Hives and Other (See Comments)    Panic attack  . Ciprofloxacin Hcl Hives and Other (See Comments)    Panic attack  . Penicillins Hives  . Shrimp [Shellfish Allergy] Other (See Comments)    flushing    Medications: I have reviewed the patient's current medications.  Results for orders placed during the hospital encounter of 02/17/14 (from the past 48 hour(s))  CBC WITH DIFFERENTIAL     Status: Abnormal   Collection Time    02/17/14  4:42 PM      Result Value Ref Range   WBC 22.6 (*) 4.0 - 10.5 K/uL   RBC 5.67 (*) 3.87 - 5.11 MIL/uL  Hemoglobin 16.7 (*) 12.0 - 15.0 g/dL   HCT 49.9 (*) 36.0 - 46.0 %   MCV 88.0  78.0 - 100.0 fL   MCH 29.5  26.0 - 34.0 pg   MCHC 33.5  30.0 - 36.0 g/dL   RDW 13.9  11.5 - 15.5 %   Platelets 397  150 - 400 K/uL   Neutrophils Relative % 86 (*) 43 - 77 %   Neutro Abs 19.6 (*) 1.7 - 7.7 K/uL   Lymphocytes Relative 8 (*) 12 - 46 %   Lymphs Abs 1.7  0.7 - 4.0 K/uL   Monocytes Relative 6  3 - 12 %   Monocytes Absolute 1.3 (*) 0.1 - 1.0 K/uL   Eosinophils Relative 0  0 - 5 %   Eosinophils Absolute 0.1  0.0 - 0.7 K/uL   Basophils Relative 0  0 - 1 %   Basophils Absolute 0.0  0.0 - 0.1 K/uL  COMPREHENSIVE METABOLIC PANEL     Status: Abnormal   Collection Time    02/17/14  4:42 PM      Result Value Ref Range   Sodium 135 (*) 137 - 147 mEq/L    Potassium 4.9  3.7 - 5.3 mEq/L   Chloride 93 (*) 96 - 112 mEq/L   CO2 25  19 - 32 mEq/L   Glucose, Bld 128 (*) 70 - 99 mg/dL   BUN 22  6 - 23 mg/dL   Creatinine, Ser 1.06  0.50 - 1.10 mg/dL   Calcium 10.3  8.4 - 10.5 mg/dL   Total Protein 7.9  6.0 - 8.3 g/dL   Albumin 3.8  3.5 - 5.2 g/dL   AST 19  0 - 37 U/L   ALT 16  0 - 35 U/L   Alkaline Phosphatase 169 (*) 39 - 117 U/L   Total Bilirubin 0.8  0.3 - 1.2 mg/dL   GFR calc non Af Amer 55 (*) >90 mL/min   GFR calc Af Amer 64 (*) >90 mL/min   Comment: (NOTE)     The eGFR has been calculated using the CKD EPI equation.     This calculation has not been validated in all clinical situations.     eGFR's persistently <90 mL/min signify possible Chronic Kidney     Disease.   Anion gap 17 (*) 5 - 15  URINALYSIS, ROUTINE W REFLEX MICROSCOPIC     Status: Abnormal   Collection Time    02/17/14  7:15 PM      Result Value Ref Range   Color, Urine AMBER (*) YELLOW   Comment: BIOCHEMICALS MAY BE AFFECTED BY COLOR   APPearance CLOUDY (*) CLEAR   Specific Gravity, Urine 1.025  1.005 - 1.030   pH 6.0  5.0 - 8.0   Glucose, UA NEGATIVE  NEGATIVE mg/dL   Hgb urine dipstick LARGE (*) NEGATIVE   Bilirubin Urine NEGATIVE  NEGATIVE   Ketones, ur NEGATIVE  NEGATIVE mg/dL   Protein, ur >300 (*) NEGATIVE mg/dL   Urobilinogen, UA 0.2  0.0 - 1.0 mg/dL   Nitrite NEGATIVE  NEGATIVE   Leukocytes, UA SMALL (*) NEGATIVE  URINE MICROSCOPIC-ADD ON     Status: Abnormal   Collection Time    02/17/14  7:15 PM      Result Value Ref Range   Squamous Epithelial / LPF RARE  RARE   WBC, UA 11-20  <3 WBC/hpf   RBC / HPF 21-50  <3 RBC/hpf   Crystals CA OXALATE CRYSTALS (*)  NEGATIVE    Ct Abdomen Pelvis W Contrast  02/17/2014   ADDENDUM REPORT: 02/17/2014 20:17  ADDENDUM: The catheter noted in the pelvis is actually a suprapubic catheter which has pulled out of the bladder. On the sagittal sequence the tip of the catheter is approximately 11 mm from the dome of the  bladder.   Electronically Signed   By: Kalman Jewels M.D.   On: 02/17/2014 20:17   02/17/2014   CLINICAL DATA:  Abdominal pain and distention.  EXAM: CT ABDOMEN AND PELVIS WITH CONTRAST  TECHNIQUE: Multidetector CT imaging of the abdomen and pelvis was performed using the standard protocol following bolus administration of intravenous contrast.  CONTRAST:  49m OMNIPAQUE IOHEXOL 300 MG/ML SOLN, 1048mOMNIPAQUE IOHEXOL 300 MG/ML SOLN  COMPARISON:  03/20/2012.  FINDINGS: Lung bases:  Minimal dependent subpleural atelectasis. The heart is normal in size. No pericardial effusion. The distal esophagus is grossly normal.  CT abdomen:  The liver is unremarkable. No focal hepatic lesions or intrahepatic biliary dilatation. The gallbladder surgically absent. No common bile duct dilatation the pancreas is normal. A small duodenum diverticulum is noted near the pancreatic head. The spleen is within normal limits in size. No focal lesions. The adrenal glands and kidneys are unremarkable.  The stomach is mildly distended. The duodenum appears unremarkable. The small bowel is dilated and there are air-fluid levels consistent with obstruction. There is a transition to nondilated/ decompressed distal small bowel loops. This transition appears to be in the mid abdomen likely in the proximal ileum. It is best seen on series 2, image number 47 and series 5, image number 51. No mass is identified. This is likely due to adhesions. The colon is unremarkable. Scattered air and stool but no mass or inflammation.  CT pelvis: Stable large calcified fibroid on the left side. There is a peritoneal dialysis catheter in the pelvis. No significant free pelvic fluid collections. The bladder is unremarkable. Moderate stool in the rectum. The aorta and branch vessels are normal. No mesenteric or retroperitoneal mass or adenopathy  Bony structures: No significant findings. Moderate degenerative changes involving the hips and spine. No destructive  bony changes.  IMPRESSION: Small bowel obstruction in the central mid abdomen likely due to adhesions as discussed above.  Stable large partially calcified fibroid.  Intrapelvic catheter is noted.  Electronically Signed: By: MaKalman Jewels.D. On: 02/17/2014 19:23    Review of Systems  Constitutional: Negative for fever and chills.  Eyes:       Blind right eye  Respiratory: Negative.   Cardiovascular: Negative.   Gastrointestinal: Positive for nausea, vomiting, abdominal pain and constipation.  Genitourinary: Positive for dysuria.  Musculoskeletal: Positive for falls.  Neurological: Positive for weakness and headaches.  Endo/Heme/Allergies: Negative.   Psychiatric/Behavioral: Negative.    Blood pressure 102/64, pulse 119, temperature 97.8 F (36.6 C), temperature source Oral, resp. rate 18, SpO2 97.00%. Physical Exam  Constitutional: She appears well-developed and well-nourished.  bedridden  HENT:  Head: Normocephalic and atraumatic.  Eyes: EOM are normal. Pupils are equal, round, and reactive to light.  Neck: Normal range of motion. Neck supple.  Cardiovascular: Normal rate.   Respiratory: Effort normal and breath sounds normal.  GI: She exhibits distension. Bowel sounds are absent. There is tenderness in the suprapubic area. There is no rebound and negative Murphy's sign.    Neurological:  Pt has MS and very weak from this  Skin: Skin is warm and dry.  Psychiatric: She has a normal mood and  affect. Her behavior is normal. Thought content normal.    Assessment/Plan: SBO Pt being admitted to medicine.  Urology has seen her as well.   NPO IVF NGT Check films in am.  Jerriyah Louis A. 02/17/2014, 9:59 PM

## 2014-02-17 NOTE — ED Provider Notes (Signed)
CSN: [id number]     Arrival date & time 02/17/14  1552 History   First MD Initiated Contact with Patient 02/17/14 1615     Chief Complaint  Patient presents with  . Abdominal Pain     (Consider location/radiation/quality/duration/timing/severity/associated sxs/prior Treatment) HPI  63 year old female with abdominal pain and worsening distention. Gradual onset yesterday and progressively worsening. Pain is diffuse. This eventually localized. Associated with nausea and vomiting. No fevers or chills. No diarrhea. Last bowel movement couple days ago. Surgical history for cholecystectomy. Also recent suprapubic catheter placement approximately one month ago. History of MS and very weak at baseline.  Past Medical History  Diagnosis Date  . Urinary incontinence     neurogenic bladder--hx of uti's  . Thyroid disease   . Multiple sclerosis     pt is bedridden -does get up to recliner by hoyer lift; mentally alert, able to feed herself -no trouble swallowing. lives at home with husband and has 24 hour caregivers.  . Hypothyroidism   . Anxiety   . Blindness of right eye     complication of ms  . Cellulitis     hx of cellulitis in lower extremities -requiring multiple hospitalzations in the past--no problems in las 2 yrs  . Bedridden 09/02/2013  . High cholesterol   . Arthritis     old falls-with injuries to rt knee and both ankles and rt wrist--states she has some pain in these areas-probable arthritis  . Arthritis     "hands" (09/03/2013)  . UTI (lower urinary tract infection)   . Type II diabetes mellitus     diet control - no longer takes any diabetic meds  . GERD (gastroesophageal reflux disease)     nexium takes care  . Neuromuscular disorder     ms-not ambulatory-muscle spasms-and recent tremors left arm and left  leg have developed--dr. Daphane Shepherd in [place] is pt's neurologist   Past Surgical History  Procedure Laterality Date  . Cholecystectomy    . Tonsillectomy    . Orif  ankle fracture Left 2005  . Wrist surgery Right   . Lymph gland excision    . Mediastinoscopy  04/24/10    for enlarged right paratracheal lymph node--surgery at [facility]-Dr. Burney--pt states she does not know the  what the pathology report showed.  . Adenoidectomy    . Fracture surgery    . Cystoscopy  07/02/2011    Procedure: CYSTOSCOPY;  Surgeon: Kathi Ludwig, MD;  Location: [facility];  Service: Urology;  Laterality: N/A;  . Insertion of suprapubic catheter N/A 01/15/2014    Procedure: CYSTOSCOPY WITH BOTOX INJECTION/SUPRAPUBIC TUBE PLACEMENT;  Surgeon: Kathi Ludwig, MD;  Location: [facility];  Service: Urology;  Laterality: N/A;   No family history on file. History  Substance Use Topics  . Smoking status: Never Smoker   . Smokeless tobacco: Never Used  . Alcohol Use: No   OB History   Grav Para Term Preterm Abortions TAB SAB Ect Mult Living                 Review of Systems  All systems reviewed and negative, other than as noted in HPI.   Allergies  Avelox; Ciprofloxacin hcl; Penicillins; and Shrimp  Home Medications   Prior to Admission medications   Medication Sig Start Date End Date Taking? Authorizing Provider  acetaminophen (TYLENOL) 500 MG tablet Take 1,000 mg by mouth at bedtime as needed for mild pain.   Yes Historical Provider, MD  aspirin EC  81 MG tablet Take 81 mg by mouth daily.   Yes Historical Provider, MD  atorvastatin (LIPITOR) 20 MG tablet Take 20 mg by mouth at bedtime.   Yes Historical Provider, MD  CRANBERRY PO Take 1 tablet by mouth daily as needed (for urinary pain).    Yes Historical Provider, MD  cyclobenzaprine (FLEXERIL) 5 MG tablet Take 5-10 mg by mouth See admin instructions. Pt takes 5 mg three times daily and then 10 mg nightly at bedtime.   Yes Historical Provider, MD  darifenacin (ENABLEX) 7.5 MG 24 hr tablet Take 7.5 mg by mouth 2 (two) times daily.   Yes Historical Provider, MD  diphenhydrAMINE (BENADRYL) 25 MG tablet Take 50 mg by  mouth every 6 (six) hours as needed for itching.   Yes Historical Provider, MD  docusate sodium (COLACE) 100 MG capsule Take 100 mg by mouth daily as needed for mild constipation.   Yes Historical Provider, MD  esomeprazole (NEXIUM) 40 MG capsule Take 40 mg by mouth every morning.    Yes Historical Provider, MD  furosemide (LASIX) 20 MG tablet Take 20 mg by mouth 2 (two) times daily.   Yes Historical Provider, MD  gabapentin (NEURONTIN) 400 MG capsule Take 800 mg by mouth 4 (four) times daily.    Yes Historical Provider, MD  levothyroxine (SYNTHROID) 125 MCG tablet Take 1 tablet (125 mcg total) by mouth daily before breakfast. 05/11/13  Yes Jeralyn Bennett, MD  liver oil-zinc oxide (DESITIN) 40 % ointment Apply 1 application topically as needed for irritation.   Yes Historical Provider, MD  meclizine (ANTIVERT) 25 MG tablet Take 25 mg by mouth 3 (three) times daily as needed for dizziness.   Yes Historical Provider, MD  mineral oil-hydrophilic petrolatum (AQUAPHOR) ointment Apply 1 application topically as needed for dry skin.   Yes Historical Provider, MD  Multiple Vitamin (MULTIVITAMIN WITH MINERALS) TABS tablet Take 1 tablet by mouth daily.   Yes Historical Provider, MD  nitrofurantoin (MACRODANTIN) 50 MG capsule Take 50 mg by mouth daily.   Yes Historical Provider, MD  nystatin (MYCOSTATIN/NYSTOP) 100000 UNIT/GM POWD Apply 1 Bottle topically 2 (two) times daily. To perineal/groin area and right great toe as needed for yeast   Yes Historical Provider, MD  nystatin cream (MYCOSTATIN) Apply 1 application topically as needed for dry skin.   Yes Historical Provider, MD  simethicone (GAS-X EXTRA STRENGTH) 125 MG chewable tablet Chew 125 mg by mouth every 6 (six) hours as needed for flatulence.   Yes Historical Provider, MD  traMADol-acetaminophen (ULTRACET) 37.5-325 MG per tablet Take 1 tablet by mouth every 6 (six) hours as needed. 01/15/14  Yes Kathi Ludwig, MD  trolamine salicylate  (ASPERCREME) 10 % cream Apply 1 application topically as needed for muscle pain.   Yes Historical Provider, MD   BP 137/67  Pulse 110  Temp(Src) 97.8 F (36.6 C) (Oral)  Resp 16  SpO2 95% Physical Exam  Nursing note and vitals reviewed. Constitutional:  Laying in bed. No acute distress. Chronically ill-appearing  HENT:  Head: Normocephalic and atraumatic.  Eyes: Conjunctivae are normal. Right eye exhibits no discharge. Left eye exhibits no discharge.  Neck: Neck supple.  Cardiovascular: Regular rhythm and normal heart sounds.  Exam reveals no gallop and no friction rub.   No murmur heard. Mild tachycardia  Pulmonary/Chest: Effort normal and breath sounds normal. No respiratory distress.  Abdominal: Soft. She exhibits no distension. There is no tenderness.  Genitourinary:  Suprapubic catheter. No noticeable leakage around insertion.  Small amount of urine noted in tubing/bag. Mild abdominal distention. Mild, diffuse tenderness without rebound or guarding. Surgical scar consistent with prior cholecystectomy.  Musculoskeletal: She exhibits no edema and no tenderness.  Neurological: She is alert.  Skin: Skin is warm and dry.  Psychiatric: She has a normal mood and affect. Her behavior is normal. Thought content normal.    ED Course  Procedures (including critical care time) Labs Review Labs Reviewed  CBC WITH DIFFERENTIAL - Abnormal; Notable for the following:    WBC 22.6 (*)    RBC 5.67 (*)    Hemoglobin 16.7 (*)    HCT 49.9 (*)    Neutrophils Relative % 86 (*)    Neutro Abs 19.6 (*)    Lymphocytes Relative 8 (*)    Monocytes Absolute 1.3 (*)    All other components within normal limits  COMPREHENSIVE METABOLIC PANEL - Abnormal; Notable for the following:    Sodium 135 (*)    Chloride 93 (*)    Glucose, Bld 128 (*)    Alkaline Phosphatase 169 (*)    GFR calc non Af Amer 55 (*)    GFR calc Af Amer 64 (*)    Anion gap 17 (*)    All other components within normal limits   URINALYSIS, ROUTINE W REFLEX MICROSCOPIC    Imaging Review Ct Abdomen Pelvis W Contrast  02/17/2014   CLINICAL DATA:  Abdominal pain and distention.  EXAM: CT ABDOMEN AND PELVIS WITH CONTRAST  TECHNIQUE: Multidetector CT imaging of the abdomen and pelvis was performed using the standard protocol following bolus administration of intravenous contrast.  CONTRAST:  50mL OMNIPAQUE IOHEXOL 300 MG/ML SOLN, OMNIPAQUE IOHEXOL 300 MG/ML SOLN  COMPARISON:  03/20/2012.  FINDINGS: Lung bases:  Minimal dependent subpleural atelectasis. The heart is normal in size. No pericardial effusion. The distal esophagus is grossly normal.  CT abdomen:  The liver is unremarkable. No focal hepatic lesions or intrahepatic biliary dilatation. The gallbladder surgically absent. No common bile duct dilatation the pancreas is normal. A small duodenum diverticulum is noted near the pancreatic head. The spleen is within normal limits in size. No focal lesions. The adrenal glands and kidneys are unremarkable.  The stomach is mildly distended. The duodenum appears unremarkable. The small bowel is dilated and there are air-fluid levels consistent with obstruction. There is a transition to nondilated/ decompressed distal small bowel loops. This transition appears to be in the mid abdomen likely in the proximal ileum. It is best seen on series 2, image number 47 and series 5, image number 51. No mass is identified. This is likely due to adhesions. The colon is unremarkable. Scattered air and stool but no mass or inflammation.  CT pelvis: Stable large calcified fibroid on the left side. There is a peritoneal dialysis catheter in the pelvis. No significant free pelvic fluid collections. The bladder is unremarkable. Moderate stool in the rectum. The aorta and branch vessels are normal. No mesenteric or retroperitoneal mass or adenopathy  Bony structures: No significant findings. Moderate degenerative changes involving the hips and spine. No  destructive bony changes.  IMPRESSION: Small bowel obstruction in the central mid abdomen likely due to adhesions as discussed above.  Stable large partially calcified fibroid.  Intrapelvic catheter is noted.   Electronically Signed   By: Loralie Champagne M.D.   On: 02/17/2014 19:23     EKG Interpretation None      MDM   Final diagnoses:  Small bowel obstruction  Suprapubic catheter dysfunction,  initial encounter    63yF with abdominal distension and n/v. CT with SBO. Incidentally, end of suprapubic catheter actually terminates intraperitoneally outside of bladder. Urine noted in tubing, but no significant collection in bag noted since has been in ED. Placed 01/15/14. Will discuss with urology.   Discussed with Dr Laverle PatterBorden, urology. Will see pt. Discussed with medicine for admission. Dr Luisa Hartornett, general surgery, will be seeing in consultation.   Raeford RazorStephen Admire Bunnell, MD 02/24/14 2217

## 2014-02-17 NOTE — ED Notes (Addendum)
Please cal pt's caregiver with updates. [phone] [facility]

## 2014-02-17 NOTE — Consult Note (Signed)
Urology Consult   Physician requesting consult: Elisabeth Pigeon, ED.  Reason for consult: Malpositioned SPT.  History of Present Illness: [name] is a 63 y.o. with MS and NGB who has been managed with a SPT for the last month. Yesterday, it was exchanged for the first time. There were no issues. Today, she was admitted for nausea/vomiting and CT abd/pelv showed a SBO as well as a SPT which had pulled back into the track. She has continued to drain yellow urine. She is not having any bladder fullness. She has not leaked out her urethra. No fevers. On the CT scan, her bladder is not overly filled.  She denies a history of voiding or storage urinary symptoms, hematuria, UTIs, STDs, urolithiasis, GU malignancy/trauma/surgery.  Past Medical History  Diagnosis Date  . Urinary incontinence     neurogenic bladder--hx of uti's  . Thyroid disease   . Multiple sclerosis     pt is bedridden -does get up to recliner by hoyer lift; mentally alert, able to feed herself -no trouble swallowing. lives at home with husband and has 24 hour caregivers.  . Hypothyroidism   . Anxiety   . Blindness of right eye     complication of ms  . Cellulitis     hx of cellulitis in lower extremities -requiring multiple hospitalzations in the past--no problems in las 2 yrs  . Bedridden 09/02/2013  . High cholesterol   . Arthritis     old falls-with injuries to rt knee and both ankles and rt wrist--states she has some pain in these areas-probable arthritis  . Arthritis     "hands" (09/03/2013)  . UTI (lower urinary tract infection)   . Type II diabetes mellitus     diet control - no longer takes any diabetic meds  . GERD (gastroesophageal reflux disease)     nexium takes care  . Neuromuscular disorder     ms-not ambulatory-muscle spasms-and recent tremors left arm and left  leg have developed--dr. Daphane Shepherd in [place] is pt's neurologist    Past Surgical History  Procedure Laterality Date  . Cholecystectomy    .  Tonsillectomy    . Orif ankle fracture Left 2005  . Wrist surgery Right   . Lymph gland excision    . Mediastinoscopy  04/24/10    for enlarged right paratracheal lymph node--surgery at [facility]-Dr. Burney--pt states she does not know the  what the pathology report showed.  . Adenoidectomy    . Fracture surgery    . Cystoscopy  07/02/2011    Procedure: CYSTOSCOPY;  Surgeon: Kathi Ludwig, MD;  Location: [facility];  Service: Urology;  Laterality: N/A;  . Insertion of suprapubic catheter N/A 01/15/2014    Procedure: CYSTOSCOPY WITH BOTOX INJECTION/SUPRAPUBIC TUBE PLACEMENT;  Surgeon: Kathi Ludwig, MD;  Location: [facility];  Service: Urology;  Laterality: N/A;     Current Hospital Medications:  Home meds:    Medication List    ASK your doctor about these medications       acetaminophen 500 MG tablet  Commonly known as:  TYLENOL  Take 1,000 mg by mouth at bedtime as needed for mild pain.     aspirin EC 81 MG tablet  Take 81 mg by mouth daily.     atorvastatin 20 MG tablet  Commonly known as:  LIPITOR  Take 20 mg by mouth at bedtime.     CRANBERRY PO  Take 1 tablet by mouth daily as needed (for urinary pain).  cyclobenzaprine 5 MG tablet  Commonly known as:  FLEXERIL  Take 5-10 mg by mouth See admin instructions. Pt takes 5 mg three times daily and then 10 mg nightly at bedtime.     darifenacin 7.5 MG 24 hr tablet  Commonly known as:  ENABLEX  Take 7.5 mg by mouth 2 (two) times daily.     diphenhydrAMINE 25 MG tablet  Commonly known as:  BENADRYL  Take 50 mg by mouth every 6 (six) hours as needed for itching.     docusate sodium 100 MG capsule  Commonly known as:  COLACE  Take 100 mg by mouth daily as needed for mild constipation.     esomeprazole 40 MG capsule  Commonly known as:  NEXIUM  Take 40 mg by mouth every morning.     furosemide 20 MG tablet  Commonly known as:  LASIX  Take 20 mg by mouth 2 (two) times daily.     gabapentin 400 MG capsule   Commonly known as:  NEURONTIN  Take 800 mg by mouth 4 (four) times daily.     GAS-X EXTRA STRENGTH 125 MG chewable tablet  Generic drug:  simethicone  Chew 125 mg by mouth every 6 (six) hours as needed for flatulence.     levothyroxine 125 MCG tablet  Commonly known as:  SYNTHROID  Take 1 tablet (125 mcg total) by mouth daily before breakfast.     liver oil-zinc oxide 40 % ointment  Commonly known as:  DESITIN  Apply 1 application topically as needed for irritation.     meclizine 25 MG tablet  Commonly known as:  ANTIVERT  Take 25 mg by mouth 3 (three) times daily as needed for dizziness.     mineral oil-hydrophilic petrolatum ointment  Apply 1 application topically as needed for dry skin.     multivitamin with minerals Tabs tablet  Take 1 tablet by mouth daily.     nitrofurantoin 50 MG capsule  Commonly known as:  MACRODANTIN  Take 50 mg by mouth daily.     nystatin cream  Commonly known as:  MYCOSTATIN  Apply 1 application topically as needed for dry skin.     nystatin 100000 UNIT/GM Powd  Apply 1 Bottle topically 2 (two) times daily. To perineal/groin area and right great toe as needed for yeast     traMADol-acetaminophen 37.5-325 MG per tablet  Commonly known as:  ULTRACET  Take 1 tablet by mouth every 6 (six) hours as needed.     trolamine salicylate 10 % cream  Commonly known as:  ASPERCREME  Apply 1 application topically as needed for muscle pain.        Scheduled Meds:  Continuous Infusions: . ceFEPime (MAXIPIME) IV    . [START ON 02/18/2014] ceFEPime (MAXIPIME) IV     PRN Meds:.  Allergies:  Allergies  Allergen Reactions  . Avelox [Moxifloxacin Hcl In Nacl] Hives and Other (See Comments)    Panic attack  . Ciprofloxacin Hcl Hives and Other (See Comments)    Panic attack  . Penicillins Hives  . Shrimp [Shellfish Allergy] Other (See Comments)    flushing    History reviewed. No pertinent family history.  Social History:  reports that she has  never smoked. She has never used smokeless tobacco. She reports that she does not drink alcohol or use illicit drugs.  ROS: A complete review of systems was performed.  All systems are negative except for pertinent findings as noted.  Physical Exam:  Vital  signs in last 24 hours: Temp:  [97.8 F (36.6 C)] 97.8 F (36.6 C) (08/19 1553) Pulse Rate:  [110-119] 119 (08/19 2058) Resp:  [16-18] 18 (08/19 2058) BP: (102-137)/(64-84) 102/64 mmHg (08/19 2058) SpO2:  [95 %-97 %] 97 % (08/19 2058) Constitutional:  Alert and oriented, No acute distress Cardiovascular: Regular rate and rhythm, No JVD Respiratory: Normal respiratory effort, Lungs clear bilaterally GI: Abdomen is soft, nontender, nondistended, no abdominal masses GU: No CVA tenderness. SPT clear yellow urine. Lymphatic: No lymphadenopathy Neurologic: Grossly intact, no focal deficits Psychiatric: Normal mood and affect  Laboratory Data:   Recent Labs  02/17/14 1642  WBC 22.6*  HGB 16.7*  HCT 49.9*  PLT 397     Recent Labs  02/17/14 1642  NA 135*  K 4.9  CL 93*  GLUCOSE 128*  BUN 22  CALCIUM 10.3  CREATININE 1.06     Results for orders placed during the hospital encounter of 02/17/14 (from the past 24 hour(s))  CBC WITH DIFFERENTIAL     Status: Abnormal   Collection Time    02/17/14  4:42 PM      Result Value Ref Range   WBC 22.6 (*) 4.0 - 10.5 K/uL   RBC 5.67 (*) 3.87 - 5.11 MIL/uL   Hemoglobin 16.7 (*) 12.0 - 15.0 g/dL   HCT 69.649.9 (*) 29.536.0 - 28.446.0 %   MCV 88.0  78.0 - 100.0 fL   MCH 29.5  26.0 - 34.0 pg   MCHC 33.5  30.0 - 36.0 g/dL   RDW 13.213.9  44.011.5 - 10.215.5 %   Platelets 397  150 - 400 K/uL   Neutrophils Relative % 86 (*) 43 - 77 %   Neutro Abs 19.6 (*) 1.7 - 7.7 K/uL   Lymphocytes Relative 8 (*) 12 - 46 %   Lymphs Abs 1.7  0.7 - 4.0 K/uL   Monocytes Relative 6  3 - 12 %   Monocytes Absolute 1.3 (*) 0.1 - 1.0 K/uL   Eosinophils Relative 0  0 - 5 %   Eosinophils Absolute 0.1  0.0 - 0.7 K/uL    Basophils Relative 0  0 - 1 %   Basophils Absolute 0.0  0.0 - 0.1 K/uL  COMPREHENSIVE METABOLIC PANEL     Status: Abnormal   Collection Time    02/17/14  4:42 PM      Result Value Ref Range   Sodium 135 (*) 137 - 147 mEq/L   Potassium 4.9  3.7 - 5.3 mEq/L   Chloride 93 (*) 96 - 112 mEq/L   CO2 25  19 - 32 mEq/L   Glucose, Bld 128 (*) 70 - 99 mg/dL   BUN 22  6 - 23 mg/dL   Creatinine, Ser 7.251.06  0.50 - 1.10 mg/dL   Calcium 36.610.3  8.4 - 44.010.5 mg/dL   Total Protein 7.9  6.0 - 8.3 g/dL   Albumin 3.8  3.5 - 5.2 g/dL   AST 19  0 - 37 U/L   ALT 16  0 - 35 U/L   Alkaline Phosphatase 169 (*) 39 - 117 U/L   Total Bilirubin 0.8  0.3 - 1.2 mg/dL   GFR calc non Af Amer 55 (*) >90 mL/min   GFR calc Af Amer 64 (*) >90 mL/min   Anion gap 17 (*) 5 - 15  URINALYSIS, ROUTINE W REFLEX MICROSCOPIC     Status: Abnormal   Collection Time    02/17/14  7:15 PM  Result Value Ref Range   Color, Urine AMBER (*) YELLOW   APPearance CLOUDY (*) CLEAR   Specific Gravity, Urine 1.025  1.005 - 1.030   pH 6.0  5.0 - 8.0   Glucose, UA NEGATIVE  NEGATIVE mg/dL   Hgb urine dipstick LARGE (*) NEGATIVE   Bilirubin Urine NEGATIVE  NEGATIVE   Ketones, ur NEGATIVE  NEGATIVE mg/dL   Protein, ur >696 (*) NEGATIVE mg/dL   Urobilinogen, UA 0.2  0.0 - 1.0 mg/dL   Nitrite NEGATIVE  NEGATIVE   Leukocytes, UA SMALL (*) NEGATIVE  URINE MICROSCOPIC-ADD ON     Status: Abnormal   Collection Time    02/17/14  7:15 PM      Result Value Ref Range   Squamous Epithelial / LPF RARE  RARE   WBC, UA 11-20  <3 WBC/hpf   RBC / HPF 21-50  <3 RBC/hpf   Crystals CA OXALATE CRYSTALS (*) NEGATIVE   No results found for this or any previous visit (from the past 240 hour(s)).  Renal Function:  Recent Labs  02/17/14 1642  CREATININE 1.06   The CrCl is unknown because both a height and weight (above a minimum accepted value) are required for this calculation.  Radiologic Imaging: Ct Abdomen Pelvis W Contrast  02/17/2014    ADDENDUM REPORT: 02/17/2014 20:17  ADDENDUM: The catheter noted in the pelvis is actually a suprapubic catheter which has pulled out of the bladder. On the sagittal sequence the tip of the catheter is approximately 11 mm from the dome of the bladder.   Electronically Signed   By: Loralie Champagne M.D.   On: 02/17/2014 20:17   02/17/2014   CLINICAL DATA:  Abdominal pain and distention.  EXAM: CT ABDOMEN AND PELVIS WITH CONTRAST  TECHNIQUE: Multidetector CT imaging of the abdomen and pelvis was performed using the standard protocol following bolus administration of intravenous contrast.  CONTRAST:  50mL OMNIPAQUE IOHEXOL 300 MG/ML SOLN, OMNIPAQUE IOHEXOL 300 MG/ML SOLN  COMPARISON:  03/20/2012.  FINDINGS: Lung bases:  Minimal dependent subpleural atelectasis. The heart is normal in size. No pericardial effusion. The distal esophagus is grossly normal.  CT abdomen:  The liver is unremarkable. No focal hepatic lesions or intrahepatic biliary dilatation. The gallbladder surgically absent. No common bile duct dilatation the pancreas is normal. A small duodenum diverticulum is noted near the pancreatic head. The spleen is within normal limits in size. No focal lesions. The adrenal glands and kidneys are unremarkable.  The stomach is mildly distended. The duodenum appears unremarkable. The small bowel is dilated and there are air-fluid levels consistent with obstruction. There is a transition to nondilated/ decompressed distal small bowel loops. This transition appears to be in the mid abdomen likely in the proximal ileum. It is best seen on series 2, image number 47 and series 5, image number 51. No mass is identified. This is likely due to adhesions. The colon is unremarkable. Scattered air and stool but no mass or inflammation.  CT pelvis: Stable large calcified fibroid on the left side. There is a peritoneal dialysis catheter in the pelvis. No significant free pelvic fluid collections. The bladder is unremarkable.  Moderate stool in the rectum. The aorta and branch vessels are normal. No mesenteric or retroperitoneal mass or adenopathy  Bony structures: No significant findings. Moderate degenerative changes involving the hips and spine. No destructive bony changes.  IMPRESSION: Small bowel obstruction in the central mid abdomen likely due to adhesions as discussed above.  Stable large  partially calcified fibroid.  Intrapelvic catheter is noted.  Electronically Signed: By: Loralie Champagne M.D. On: 02/17/2014 19:23    I independently reviewed the above imaging studies.  Procedure: The SPT was irrigated with 60cc sterile saline, and this would not return, confirming malposition. The balloon was deflated, and noted to have 1cc in it. The catheter was advanced through the tract for 5 inches, with return of clear yellow urine. 10cc of sterile water were placed into the balloon. The catheter was instilled with 60cc of sterile saline, and I was able to pull back 60cc, confirming proper positioning.  Impression/Recommendation: Malpositioned SPT due to balloon underinflation. Now improved.  This SPT can stay in place for 1 month. Continue monthly changes per protocol.  Will sign off. Call if needed.  Discussed with Dr. Gordy Levan, Guy Sandifer 02/17/2014, 9:39 PM

## 2014-02-17 NOTE — H&P (Addendum)
[facility] History and Physical  MARYFRANCES PORTUGAL ZOX:[id number] DOB: 1951/02/22 DOA: 02/17/2014  Referring physician: ER physician PCP: [name], NP   Chief Complaint: abdominal pain   HPI:  Pt is 63 yo female with known MS and chronic bilateral LE weakness, presenting to [facility] ED 02/17/2014 with main concern of 2 days duration of progressively worsening generalized abdominal pain, constant and sharp, 10/10 in severity, non radiating, associated with nausea, vomiting (3 episodes, non bloody) and distension, poor oral intake, and with no specific alleviating factors. Pt denies fevers, chills, no specific urinary concerns.No diarrhea or constipation. No blood in stool or urine.  In ED, vitals are stable. Pt noted to be in mild distress due to pain. CT abd notable for SBO and surgery was consulted. Also noted incidentally on CT scan was suprapubic catheter terminating intraperitoneally outside of bladder with urine noted in tubing. Urologist Dr. Laverle Patter also consulted by ED doctor, [facility] asked to admit for further evaluation.   Assessment & Plan    Principal Problem:   SBO (small bowel obstruction)  Admit for further evaluation  Keep NPO, provide IVF, analgesia, antiemetics as needed  Will follow up on surgery recommendations  Active Problems:   Multiple sclerosis / Functional quadriplegia  With chronic bilateral LE weakness   Wheelchair and bedbound    GERD (gastroesophageal reflux disease)  Continue Protonix as per home medical regimen    Hypothyroidism  Continue synthroid   Check TSH   Blindness of right eye   UTI (urinary tract infection)  Place on empiric Maxipime due to multiple allergies  Follow up on urine culture and sensitivity    DM type 2 (diabetes mellitus, type 2) with complications of neuropathy   Check A1C  Continue Gabapentin   Place temporarily in SSI   Sacral decubitus ulcer, stage II  Secondary to bed ridden state   Wound care consulted    Leukocytosis  Possibly secondary to acute SBO with underlying UTI  Maxipime as noted above  Repeat CBC in AM   Dyslipidemia  Continue statin    Neuropathic pain  Analgesia as needed    DVT prophylaxis  Lovenox subQ while pt is in hospital   Radiological Exams on Admission: Ct Abdomen Pelvis W Contrast  02/17/2014    Small bowel obstruction in the central mid abdomen likely due to adhesions as discussed above.  Stable large partially calcified fibroid.  The catheter noted in the pelvis is actually a suprapubic catheter which has pulled out of the bladder. On the sagittal sequence the tip of the catheter is approximately 11 mm from the dome of the bladder.     EKG: pending   Code Status: Full Family Communication: Plan of care discussed with the patient  Disposition Plan: Admit for further evaluation  Manson Passey, MD  [facility] Pager [phone]  Review of Systems:  Constitutional: Negative for fever, chills and malaise/fatigue. Negative for diaphoresis.  HENT: Negative for hearing loss, ear pain, nosebleeds, congestion, sore throat, neck pain, tinnitus and ear discharge.   Eyes: Negative for blurred vision, double vision, photophobia, pain, discharge and redness.  Respiratory: Negative for cough, hemoptysis, sputum production, shortness of breath, wheezing and stridor.   Cardiovascular: Negative for chest pain, palpitations, orthopnea, claudication and leg swelling.  Gastrointestinal: positive for abdominal distention, nausea and vomiting..  Genitourinary: Negative for dysuria, urgency, frequency, hematuria and flank pain.  Musculoskeletal: Negative for myalgias, back pain, joint pain and falls.  Skin: Negative for itching and rash.  Neurological: Negative for dizziness and weakness. Negative for tingling, tremors, sensory change, speech change, focal weakness, loss of consciousness and headaches.  Endo/Heme/Allergies: Negative for environmental allergies and polydipsia.  Does not bruise/bleed easily.  Psychiatric/Behavioral: Negative for suicidal ideas. The patient is not nervous/anxious.      Past Medical History  Diagnosis Date  . Urinary incontinence     neurogenic bladder--hx of uti's  . Thyroid disease   . Multiple sclerosis     pt is bedridden -does get up to recliner by hoyer lift; mentally alert, able to feed herself -no trouble swallowing. lives at home with husband and has 24 hour caregivers.  . Hypothyroidism   . Anxiety   . Blindness of right eye     complication of ms  . Cellulitis     hx of cellulitis in lower extremities -requiring multiple hospitalzations in the past--no problems in las 2 yrs  . Bedridden 09/02/2013  . High cholesterol   . Arthritis     old falls-with injuries to rt knee and both ankles and rt wrist--states she has some pain in these areas-probable arthritis  . Arthritis     "hands" (09/03/2013)  . UTI (lower urinary tract infection)   . Type II diabetes mellitus     diet control - no longer takes any diabetic meds  . GERD (gastroesophageal reflux disease)     nexium takes care  . Neuromuscular disorder     ms-not ambulatory-muscle spasms-and recent tremors left arm and left  leg have developed--dr. Daphane Shepherd in [place] is pt's neurologist   Past Surgical History  Procedure Laterality Date  . Cholecystectomy    . Tonsillectomy    . Orif ankle fracture Left 2005  . Wrist surgery Right   . Lymph gland excision    . Mediastinoscopy  04/24/10    for enlarged right paratracheal lymph node--surgery at [facility]-Dr. Burney--pt states she does not know the  what the pathology report showed.  . Adenoidectomy    . Fracture surgery    . Cystoscopy  Feb 11, 202012    Procedure: CYSTOSCOPY;  Surgeon: Kathi Ludwig, MD;  Location: [facility];  Service: Urology;  Laterality: N/A;  . Insertion of suprapubic catheter N/A 01/15/2014    Procedure: CYSTOSCOPY WITH BOTOX INJECTION/SUPRAPUBIC TUBE PLACEMENT;  Surgeon: Kathi Ludwig, MD;  Location: [facility];  Service: Urology;  Laterality: N/A;   Social History:  reports that she has never smoked. She has never used smokeless tobacco. She reports that she does not drink alcohol or use illicit drugs.  Allergies  Allergen Reactions  . Avelox [Moxifloxacin Hcl In Nacl] Hives and Other (See Comments)    Panic attack  . Ciprofloxacin Hcl Hives and Other (See Comments)    Panic attack  . Penicillins Hives  . Shrimp [Shellfish Allergy] Other (See Comments)    flushing    Family History: htn in family   Medication Sig  acetaminophen (TYLENOL) 500 MG tablet Take 1,000 mg by mouth at bedtime as needed for mild pain.  aspirin EC 81 MG tablet Take 81 mg by mouth daily.  atorvastatin (LIPITOR) 20 MG tablet Take 20 mg by mouth at bedtime.  CRANBERRY PO Take 1 tablet by mouth daily as needed (for urinary pain).   cyclobenzaprine (FLEXERIL) 5 MG tablet Take 5-10 mg by mouth See admin instructions. Pt takes 5 mg three times daily and then 10 mg nightly at bedtime.  darifenacin (ENABLEX) 7.5 MG 24 hr tablet Take 7.5 mg by  mouth 2 (two) times daily.  diphenhydrAMINE (BENADRYL) 25 MG tablet Take 50 mg by mouth every 6 (six) hours as needed for itching.  docusate sodium (COLACE) 100 MG capsule Take 100 mg by mouth daily as needed for mild constipation.  esomeprazole (NEXIUM) 40 MG capsule Take 40 mg by mouth every morning.   furosemide (LASIX) 20 MG tablet Take 20 mg by mouth 2 (two) times daily.  gabapentin (NEURONTIN) 400 MG capsule Take 800 mg by mouth 4 (four) times daily.   levothyroxine (SYNTHROID) 125 MCG tablet Take 1 tablet (125 mcg total) by mouth daily before breakfast.  liver oil-zinc oxide (DESITIN) 40 % ointment Apply 1 application topically as needed for irritation.  meclizine (ANTIVERT) 25 MG tablet Take 25 mg by mouth 3 (three) times daily as needed for dizziness.  mineral oil-hydrophilic petrolatum (AQUAPHOR) ointment Apply 1 application topically as  needed for dry skin.  Multiple Vitamin (MULTIVITAMIN WITH MINERALS) TABS tablet Take 1 tablet by mouth daily.  nitrofurantoin (MACRODANTIN) 50 MG capsule Take 50 mg by mouth daily.  nystatin (MYCOSTATIN/NYSTOP) 100000 UNIT/GM POWD Apply 1 Bottle topically 2 (two) times daily. To perineal/groin area and right great toe as needed for yeast  nystatin cream (MYCOSTATIN) Apply 1 application topically as needed for dry skin.  simethicone (GAS-X EXTRA STRENGTH) 125 MG chewable tablet Chew 125 mg by mouth every 6 (six) hours as needed for flatulence.  traMADol-acetaminophen (ULTRACET) 37.5-325 MG per tablet Take 1 tablet by mouth every 6 (six) hours as needed.  trolamine salicylate (ASPERCREME) 10 % cream Apply 1 application topically as needed for muscle pain.   Physical Exam: Filed Vitals:   02/17/14 1730 02/17/14 1800 02/17/14 1830 02/17/14 2058  BP: 117/71 137/72 137/67 102/64  Pulse:    119  Temp:      TempSrc:      Resp:    18  SpO2:    97%    Physical Exam  Constitutional: Appears well-developed and well-nourished. No distress.  HENT: Normocephalic. No tonsillar erythema or exudates Eyes: Conjunctivae and EOM are normal. PERRLA, no scleral icterus.  Neck: Normal ROM. Neck supple. No JVD. No tracheal deviation. No thyromegaly.  CVS: RRR, S1/S2 +, no murmurs, no gallops, no carotid bruit.  Pulmonary: Effort and breath sounds normal, no stridor, rhonchi, wheezes, rales.  Abdominal: Soft. BS +, distended, NG tube in place; foley catheter  Musculoskeletal: Normal range of motion. No edema and no tenderness.  Lymphadenopathy: No lymphadenopathy noted, cervical, inguinal. Neuro: Alert. Normal reflexes, muscle tone coordination. No focal neurologic deficits. Skin: Skin is warm and dry. No rash noted. Not diaphoretic. No erythema. No pallor.  Psychiatric: Normal mood and affect. Behavior, judgment, thought content normal.   Labs on Admission:  Basic Metabolic Panel:  Recent Labs Lab  02/17/14 1642  NA 135*  K 4.9  CL 93*  CO2 25  GLUCOSE 128*  BUN 22  CREATININE 1.06  CALCIUM 10.3   Liver Function Tests:  Recent Labs Lab 02/17/14 1642  AST 19  ALT 16  ALKPHOS 169*  BILITOT 0.8  PROT 7.9  ALBUMIN 3.8   No results found for this basename: LIPASE, AMYLASE,  in the last 168 hours No results found for this basename: AMMONIA,  in the last 168 hours CBC:  Recent Labs Lab 02/17/14 1642  WBC 22.6*  NEUTROABS 19.6*  HGB 16.7*  HCT 49.9*  MCV 88.0  PLT 397   Cardiac Enzymes: No results found for this basename: CKTOTAL, CKMB, CKMBINDEX, TROPONINI,  in the last 168 hours BNP: No components found with this basename: POCBNP,  CBG: No results found for this basename: GLUCAP,  in the last 168 hours  If 7PM-7AM, please contact night-coverage [email or web address] Password [email or web address] 02/17/2014, 9:18 PM

## 2014-02-17 NOTE — Progress Notes (Signed)
ANTIBIOTIC CONSULT NOTE - INITIAL  Pharmacy Consult for Cefepime Indication: UTI  Allergies  Allergen Reactions  . Avelox [Moxifloxacin Hcl In Nacl] Hives and Other (See Comments)    Panic attack  . Ciprofloxacin Hcl Hives and Other (See Comments)    Panic attack  . Penicillins Hives  . Shrimp [Shellfish Allergy] Other (See Comments)    flushing    Patient Measurements:    Vital Signs: Temp: 97.8 F (36.6 C) (08/19 1553) Temp src: Oral (08/19 1553) BP: 102/64 mmHg (08/19 2058) Pulse Rate: 119 (08/19 2058) Intake/Output from previous day:   Intake/Output from this shift:    Labs:  Recent Labs  02/17/14 1642  WBC 22.6*  HGB 16.7*  PLT 397  CREATININE 1.06   The CrCl is unknown because both a height and weight (above a minimum accepted value) are required for this calculation. No results found for this basename: VANCOTROUGH, VANCOPEAK, VANCORANDOM, GENTTROUGH, GENTPEAK, GENTRANDOM, TOBRATROUGH, TOBRAPEAK, TOBRARND, AMIKACINPEAK, AMIKACINTROU, AMIKACIN,  in the last 72 hours   Microbiology: No results found for this or any previous visit (from the past 720 hour(s)).  Medical History: Past Medical History  Diagnosis Date  . Urinary incontinence     neurogenic bladder--hx of uti's  . Thyroid disease   . Multiple sclerosis     pt is bedridden -does get up to recliner by hoyer lift; mentally alert, able to feed herself -no trouble swallowing. lives at home with husband and has 24 hour caregivers.  . Hypothyroidism   . Anxiety   . Blindness of right eye     complication of ms  . Cellulitis     hx of cellulitis in lower extremities -requiring multiple hospitalzations in the past--no problems in las 2 yrs  . Bedridden 09/02/2013  . High cholesterol   . Arthritis     old falls-with injuries to rt knee and both ankles and rt wrist--states she has some pain in these areas-probable arthritis  . Arthritis     "hands" (09/03/2013)  . UTI (lower urinary tract infection)    . Type II diabetes mellitus     diet control - no longer takes any diabetic meds  . GERD (gastroesophageal reflux disease)     nexium takes care  . Neuromuscular disorder     ms-not ambulatory-muscle spasms-and recent tremors left arm and left  leg have developed--dr. Daphane Shepherd in [place] is pt's neurologist     Assessment: 7 yoF presenting with abdominal pain admitted with SBO on CT.  Pharmacy consulted to dose Cefepime for UTI.  Patient has suprapubic catheter (placed 01/15/14).  Noted history of UTIs this past year (Klebsiella, E.coli, Proteus mirabilis - none which were MDR organisms).  Noted reaction of hives to penicillins listed in allergy list; however, patient has history of tolerating cephalosporins (has tolerated cephalexin, ceftriaxone, cefepime).  Tmax: afebrile WBC: elevated at 22.6 Renal: SCr 1.06, CrCl~57 ml/min (CG)  Goal of Therapy:  Eradication of infection, doses adjusted per renal function  Plan:  1.  Cefepime 1g IV q12h. 2.  F/u SCr, culture results, clinical course.  Clance Boll 02/17/2014,9:25 PM

## 2014-02-18 ENCOUNTER — Inpatient Hospital Stay (HOSPITAL_COMMUNITY): Payer: Medicare Other

## 2014-02-18 DIAGNOSIS — L8992 Pressure ulcer of unspecified site, stage 2: Secondary | ICD-10-CM

## 2014-02-18 DIAGNOSIS — N179 Acute kidney failure, unspecified: Secondary | ICD-10-CM

## 2014-02-18 DIAGNOSIS — L89109 Pressure ulcer of unspecified part of back, unspecified stage: Secondary | ICD-10-CM

## 2014-02-18 LAB — COMPREHENSIVE METABOLIC PANEL
ALBUMIN: 3.8 g/dL (ref 3.5–5.2)
ALBUMIN: 3.5 g/dL (ref 3.5–5.2)
ALT: 15 U/L (ref 0–35)
ALT: 15 U/L (ref 0–35)
ANION GAP: 17 — AB (ref 5–15)
ANION GAP: 15 (ref 5–15)
AST: 20 U/L (ref 0–37)
AST: 17 U/L (ref 0–37)
Alkaline Phosphatase: 167 U/L — ABNORMAL HIGH (ref 39–117)
Alkaline Phosphatase: 158 U/L — ABNORMAL HIGH (ref 39–117)
BILIRUBIN TOTAL: 0.9 mg/dL (ref 0.3–1.2)
BUN: 23 mg/dL (ref 6–23)
BUN: 27 mg/dL — ABNORMAL HIGH (ref 6–23)
CALCIUM: 10.1 mg/dL (ref 8.4–10.5)
CHLORIDE: 94 meq/L — AB (ref 96–112)
CO2: 23 mEq/L (ref 19–32)
CO2: 24 mEq/L (ref 19–32)
CREATININE: 0.96 mg/dL (ref 0.50–1.10)
Calcium: 9.8 mg/dL (ref 8.4–10.5)
Chloride: 96 mEq/L (ref 96–112)
Creatinine, Ser: 1.37 mg/dL — ABNORMAL HIGH (ref 0.50–1.10)
GFR calc Af Amer: 72 mL/min — ABNORMAL LOW (ref >90)
GFR calc Af Amer: 47 mL/min — ABNORMAL LOW (ref >90)
GFR calc non Af Amer: 62 mL/min — ABNORMAL LOW (ref >90)
GFR calc non Af Amer: 40 mL/min — ABNORMAL LOW (ref >90)
Glucose, Bld: 146 mg/dL — ABNORMAL HIGH (ref 70–99)
Glucose, Bld: 140 mg/dL — ABNORMAL HIGH (ref 70–99)
POTASSIUM: 5.3 meq/L (ref 3.7–5.3)
Potassium: 5.2 mEq/L (ref 3.7–5.3)
SODIUM: 135 meq/L — AB (ref 137–147)
Sodium: 134 mEq/L — ABNORMAL LOW (ref 137–147)
TOTAL PROTEIN: 7.8 g/dL (ref 6.0–8.3)
TOTAL PROTEIN: 7.5 g/dL (ref 6.0–8.3)
Total Bilirubin: 1 mg/dL (ref 0.3–1.2)

## 2014-02-18 LAB — LACTIC ACID, PLASMA: Lactic Acid, Venous: 1.7 mmol/L (ref 0.5–2.2)

## 2014-02-18 LAB — CBC
HEMATOCRIT: 48.3 % — AB (ref 36.0–46.0)
HEMOGLOBIN: 16.2 g/dL — AB (ref 12.0–15.0)
MCH: 29.5 pg (ref 26.0–34.0)
MCHC: 33.5 g/dL (ref 30.0–36.0)
MCV: 88 fL (ref 78.0–100.0)
Platelets: 443 10*3/uL — ABNORMAL HIGH (ref 150–400)
RBC: 5.49 MIL/uL — ABNORMAL HIGH (ref 3.87–5.11)
RDW: 14 % (ref 11.5–15.5)
WBC: 23.4 10*3/uL — ABNORMAL HIGH (ref 4.0–10.5)

## 2014-02-18 LAB — GLUCOSE, CAPILLARY
GLUCOSE-CAPILLARY: 97 mg/dL (ref 70–99)
Glucose-Capillary: 154 mg/dL — ABNORMAL HIGH (ref 70–99)
Glucose-Capillary: 108 mg/dL — ABNORMAL HIGH (ref 70–99)
Glucose-Capillary: 94 mg/dL (ref 70–99)
Glucose-Capillary: 92 mg/dL (ref 70–99)

## 2014-02-18 LAB — LACTATE DEHYDROGENASE: LDH: 159 U/L (ref 94–250)

## 2014-02-18 LAB — MAGNESIUM: Magnesium: 2.1 mg/dL (ref 1.5–2.5)

## 2014-02-18 LAB — PHOSPHORUS: PHOSPHORUS: 5.1 mg/dL — AB (ref 2.3–4.6)

## 2014-02-18 LAB — HEMOGLOBIN A1C
HEMOGLOBIN A1C: 5.7 % — AB (ref <5.7)
MEAN PLASMA GLUCOSE: 117 mg/dL — AB (ref <117)

## 2014-02-18 LAB — TSH: TSH: 0.085 u[IU]/mL — ABNORMAL LOW (ref 0.350–4.500)

## 2014-02-18 MED ORDER — SODIUM CHLORIDE 0.9 % IV SOLN
INTRAVENOUS | Status: AC
  Administered 2014-02-18: 16:00:00 via INTRAVENOUS

## 2014-02-18 MED ORDER — CHLORHEXIDINE GLUCONATE 0.12 % MT SOLN
15.0000 mL | Freq: Two times a day (BID) | OROMUCOSAL | Status: DC
  Administered 2014-02-18 – 2014-02-24 (×13): 15 mL via OROMUCOSAL
  Filled 2014-02-18 (×17): qty 15

## 2014-02-18 MED ORDER — CETYLPYRIDINIUM CHLORIDE 0.05 % MT LIQD
7.0000 mL | Freq: Two times a day (BID) | OROMUCOSAL | Status: DC
  Administered 2014-02-18 – 2014-02-24 (×10): 7 mL via OROMUCOSAL

## 2014-02-18 NOTE — Progress Notes (Signed)
Patient ID: [name], female   DOB: April 16, 1951, 63 y.o.   MRN: [id number]    Subjective: No flatus.  Tender in abdomen mostly around SP catheter.    Objective: Vital signs in last 24 hours: Temp:  [97.4 F (36.3 C)-97.8 F (36.6 C)] 97.6 F (36.4 C) (08/20 0623) Pulse Rate:  [108-119] 116 (08/20 0623) Resp:  [16-18] 18 (08/20 0623) BP: (102-143)/(64-84) 104/72 mmHg (08/20 0623) SpO2:  [92 %-97 %] 95 % (08/20 0623) Last BM Date: 02/14/14  Intake/Output from previous day: 08/19 0701 - 08/20 0700 In: -  Out: 650 [Urine:400; Emesis/NG output:250] Intake/Output this shift:    PE: Abd: soft, appears some distended, tender on both sides of SP tube, Few BS, NGT with bilious output  Lab Results:   Recent Labs  02/17/14 2316 02/18/14 0500  WBC 21.1* 23.4*  HGB 16.7* 16.2*  HCT 48.5* 48.3*  PLT 412* 443*   BMET  Recent Labs  02/17/14 2316 02/18/14 0500  NA 134* 135*  K 5.2 5.3  CL 94* 96  CO2 23 24  GLUCOSE 146* 140*  BUN 23 27*  CREATININE 0.96 1.37*  CALCIUM 10.1 9.8   PT/INR  Recent Labs  02/17/14 2316  LABPROT 13.7  INR 1.05   CMP     Component Value Date/Time   NA 135* 02/18/2014 0500   K 5.3 02/18/2014 0500   CL 96 02/18/2014 0500   CO2 24 02/18/2014 0500   GLUCOSE 140* 02/18/2014 0500   BUN 27* 02/18/2014 0500   CREATININE 1.37* 02/18/2014 0500   CALCIUM 9.8 02/18/2014 0500   PROT 7.5 02/18/2014 0500   ALBUMIN 3.5 02/18/2014 0500   AST 17 02/18/2014 0500   ALT 15 02/18/2014 0500   ALKPHOS 158* 02/18/2014 0500   BILITOT 1.0 02/18/2014 0500   GFRNONAA 40* 02/18/2014 0500   GFRAA 47* 02/18/2014 0500   Lipase     Component Value Date/Time   LIPASE 26 06/03/2009 1411       Studies/Results: Ct Abdomen Pelvis W Contrast  02/17/2014   ADDENDUM REPORT: 02/17/2014 20:17  ADDENDUM: The catheter noted in the pelvis is actually a suprapubic catheter which has pulled out of the bladder. On the sagittal sequence the tip of the catheter is approximately  11 mm from the dome of the bladder.   Electronically Signed   By: Loralie Champagne M.D.   On: 02/17/2014 20:17   02/17/2014   CLINICAL DATA:  Abdominal pain and distention.  EXAM: CT ABDOMEN AND PELVIS WITH CONTRAST  TECHNIQUE: Multidetector CT imaging of the abdomen and pelvis was performed using the standard protocol following bolus administration of intravenous contrast.  CONTRAST:  50mL OMNIPAQUE IOHEXOL 300 MG/ML SOLN, OMNIPAQUE IOHEXOL 300 MG/ML SOLN  COMPARISON:  03/20/2012.  FINDINGS: Lung bases:  Minimal dependent subpleural atelectasis. The heart is normal in size. No pericardial effusion. The distal esophagus is grossly normal.  CT abdomen:  The liver is unremarkable. No focal hepatic lesions or intrahepatic biliary dilatation. The gallbladder surgically absent. No common bile duct dilatation the pancreas is normal. A small duodenum diverticulum is noted near the pancreatic head. The spleen is within normal limits in size. No focal lesions. The adrenal glands and kidneys are unremarkable.  The stomach is mildly distended. The duodenum appears unremarkable. The small bowel is dilated and there are air-fluid levels consistent with obstruction. There is a transition to nondilated/ decompressed distal small bowel loops. This transition appears to be in the mid abdomen likely  in the proximal ileum. It is best seen on series 2, image number 47 and series 5, image number 51. No mass is identified. This is likely due to adhesions. The colon is unremarkable. Scattered air and stool but no mass or inflammation.  CT pelvis: Stable large calcified fibroid on the left side. There is a peritoneal dialysis catheter in the pelvis. No significant free pelvic fluid collections. The bladder is unremarkable. Moderate stool in the rectum. The aorta and branch vessels are normal. No mesenteric or retroperitoneal mass or adenopathy  Bony structures: No significant findings. Moderate degenerative changes involving the hips  and spine. No destructive bony changes.  IMPRESSION: Small bowel obstruction in the central mid abdomen likely due to adhesions as discussed above.  Stable large partially calcified fibroid.  Intrapelvic catheter is noted.  Electronically Signed: By: Loralie ChampagneMark  Gallerani M.D. On: 02/17/2014 19:23    Anti-infectives: Anti-infectives   Start     Dose/Rate Route Frequency Ordered Stop   02/18/14 1000  ceFEPIme (MAXIPIME) 1 g in dextrose 5 % 50 mL IVPB     1 g 100 mL/hr over 30 Minutes Intravenous Every 12 hours 02/17/14 2137     02/17/14 2145  ceFEPIme (MAXIPIME) 1 g in dextrose 5 % 50 mL IVPB     1 g 100 mL/hr over 30 Minutes Intravenous STAT 02/17/14 2136 02/18/14 2145       Assessment/Plan  1. ? UTI 2. Dislodged SP tube, replaced by Dr. Emelda FearFerguson 3. PSBO 4. MS  Plan: 1. Will repeat abdominal films today for follow progression 2. Cont conservative management at this time for PSBO.  No acute surgical indications 3. Cont abx therapy 4. Follow urine cx.  Suspect WBC secondary to this right now.   LOS: 1 day    Armine Rizzolo E 02/18/2014, 9:26 AM Pager: [phone]

## 2014-02-18 NOTE — Progress Notes (Signed)
[facility] PROGRESS NOTE   Assessment/Plan: SBO (small bowel obstruction): - NPO, 1400 NG tube output. - ct abd and pelvis as below. - Surgery following. Leukocytosis not improved. Repeating abd x-ray. - Check LDH and lactate. - Follow I and O's replete fluids. - minimize narcotics.  AKI (acute kidney injury) - Increase IV fluid replete electrolytes.  Multiple sclerosis / Functional quadriplegia  - With chronic bilateral LE weakness  - Wheelchair and bedbound   Malpositioned SPT due to balloon under inflation/ WBCin urine: - Now improved.  - Appriciate urology assistance. - This SPT can stay in place for 1 month. Continue monthly changes per protocol - Unlikely UTI she will be colonize due to her foley cath. Has remained afebrile, no pus or drainage from foley. - con antibiotics for now. BC pending   Leukocytosis - due to SBO, has remained afebrile.  DM type 2 (diabetes mellitus, type 2) with complications of neuropathy  - 5.7 A1C - Continue Gabapentin  - Place temporarily in SSI.  Sacral decubitus ulcer, stage II : - Secondary to bed ridden state  - Wound care consulted pending.    Code Status: Full  Family Communication: Plan of care discussed with the patient  Disposition Plan: inpatient    Consultants: Urology surgery Procedures: Ct abd 8.19.2015: There is a transition to nondilated/ decompressed distal small bowel loops. This transition appears to be in the mid abdomen likely in the proximal ileum  Antibiotics: Meropenem.  HPI/Subjective: still with abdominal pain Objective: Filed Vitals:   02/17/14 1830 02/17/14 2058 02/17/14 2254 02/18/14 0623  BP: 137/67 102/64 143/82 104/72  Pulse:  119 108 116  Temp:   97.4 F (36.3 C) 97.6 F (36.4 C)  TempSrc:   Oral Oral  Resp:  18 18 18   SpO2:  97% 92% 95%    Intake/Output Summary (Last 24 hours) at 02/18/14 [id number] Last data filed at 02/18/14 [id number]  Gross per 24 hour  Intake      0 ml    Output    650 ml  Net   -650 ml   There were no vitals filed for this visit.  Exam:  General: Alert, awake, oriented x3, in no acute distress.  HEENT: No bruits, no goiter.  Heart: Regular rate and rhythm Lungs: Good air movement clear Abdomen: Soft, tenderness bilaterally quadrants, no rebound. No tenderness around catheter.  Data Reviewed: Basic Metabolic Panel:  Recent Labs Lab 02/17/14 1642 02/17/14 2316 02/18/14 0500  NA 135* 134* 135*  K 4.9 5.2 5.3  CL 93* 94* 96  CO2 25 23 24   GLUCOSE 128* 146* 140*  BUN 22 23 27*  CREATININE 1.06 0.96 1.37*  CALCIUM 10.3 10.1 9.8  MG  --  2.1  --   PHOS  --  5.1*  --    Liver Function Tests:  Recent Labs Lab 02/17/14 1642 02/17/14 2316 02/18/14 0500  AST 19 20 17   ALT 16 15 15   ALKPHOS 169* 167* 158*  BILITOT 0.8 0.9 1.0  PROT 7.9 7.8 7.5  ALBUMIN 3.8 3.8 3.5   No results found for this basename: LIPASE, AMYLASE,  in the last 168 hours No results found for this basename: AMMONIA,  in the last 168 hours CBC:  Recent Labs Lab 02/17/14 1642 02/17/14 2316 02/18/14 0500  WBC 22.6* 21.1* 23.4*  NEUTROABS 19.6* 18.1*  --   HGB 16.7* 16.7* 16.2*  HCT 49.9* 48.5* 48.3*  MCV 88.0 86.5 88.0  PLT 397 412* 443*  Cardiac Enzymes: No results found for this basename: CKTOTAL, CKMB, CKMBINDEX, TROPONINI,  in the last 168 hours BNP (last 3 results) No results found for this basename: PROBNP,  in the last 8760 hours CBG:  Recent Labs Lab 02/17/14 2303 02/18/14 0830  GLUCAP 154* 108*    No results found for this or any previous visit (from the past 240 hour(s)).   Studies: Ct Abdomen Pelvis W Contrast  02/17/2014   ADDENDUM REPORT: 02/17/2014 20:17  ADDENDUM: The catheter noted in the pelvis is actually a suprapubic catheter which has pulled out of the bladder. On the sagittal sequence the tip of the catheter is approximately 11 mm from the dome of the bladder.   Electronically Signed   By: Loralie Champagne M.D.    On: 02/17/2014 20:17   02/17/2014   CLINICAL DATA:  Abdominal pain and distention.  EXAM: CT ABDOMEN AND PELVIS WITH CONTRAST  TECHNIQUE: Multidetector CT imaging of the abdomen and pelvis was performed using the standard protocol following bolus administration of intravenous contrast.  CONTRAST:  50mL OMNIPAQUE IOHEXOL 300 MG/ML SOLN, OMNIPAQUE IOHEXOL 300 MG/ML SOLN  COMPARISON:  03/20/2012.  FINDINGS: Lung bases:  Minimal dependent subpleural atelectasis. The heart is normal in size. No pericardial effusion. The distal esophagus is grossly normal.  CT abdomen:  The liver is unremarkable. No focal hepatic lesions or intrahepatic biliary dilatation. The gallbladder surgically absent. No common bile duct dilatation the pancreas is normal. A small duodenum diverticulum is noted near the pancreatic head. The spleen is within normal limits in size. No focal lesions. The adrenal glands and kidneys are unremarkable.  The stomach is mildly distended. The duodenum appears unremarkable. The small bowel is dilated and there are air-fluid levels consistent with obstruction. There is a transition to nondilated/ decompressed distal small bowel loops. This transition appears to be in the mid abdomen likely in the proximal ileum. It is best seen on series 2, image number 47 and series 5, image number 51. No mass is identified. This is likely due to adhesions. The colon is unremarkable. Scattered air and stool but no mass or inflammation.  CT pelvis: Stable large calcified fibroid on the left side. There is a peritoneal dialysis catheter in the pelvis. No significant free pelvic fluid collections. The bladder is unremarkable. Moderate stool in the rectum. The aorta and branch vessels are normal. No mesenteric or retroperitoneal mass or adenopathy  Bony structures: No significant findings. Moderate degenerative changes involving the hips and spine. No destructive bony changes.  IMPRESSION: Small bowel obstruction in the  central mid abdomen likely due to adhesions as discussed above.  Stable large partially calcified fibroid.  Intrapelvic catheter is noted.  Electronically Signed: By: Loralie Champagne M.D. On: 02/17/2014 19:23    Scheduled Meds: . antiseptic oral rinse  7 mL Mouth Rinse q12n4p  . aspirin EC  81 mg Oral Daily  . atorvastatin  20 mg Oral QHS  . ceFEPime (MAXIPIME) IV  1 g Intravenous STAT  . ceFEPime (MAXIPIME) IV  1 g Intravenous Q12H  . chlorhexidine  15 mL Mouth Rinse BID  . cyclobenzaprine  10 mg Oral QHS  . cyclobenzaprine  5 mg Oral TID WC  . darifenacin  7.5 mg Oral BID  . enoxaparin (LOVENOX) injection  40 mg Subcutaneous QHS  . gabapentin  800 mg Oral 4 times per day  . insulin aspart  0-9 Units Subcutaneous TID WC  . levothyroxine  125 mcg Oral QAC breakfast  .  multivitamin with minerals  1 tablet Oral Daily  . nystatin  1 Bottle Topical BID  . pantoprazole (PROTONIX) IV  40 mg Intravenous Q12H   Continuous Infusions: . sodium chloride 75 mL/hr at 02/17/14 2314     Marinda Elk  [facility] Pager [phone]. If 8PM-8AM, please contact night-coverage at [email or web address], password [email or web address] 02/18/2014, 9:22 AM  LOS: 1 day    **Disclaimer: This note may have been dictated with voice recognition software. Similar sounding words can inadvertently be transcribed and this note may contain transcription errors which may not have been corrected upon publication of note.**

## 2014-02-18 NOTE — Progress Notes (Signed)
Patient seen and examined.  Agree with PA's note.  

## 2014-02-18 NOTE — Care Management Note (Addendum)
    Page 1 of 1   02/23/2014     3:37:27 PM CARE MANAGEMENT NOTE 02/23/2014  Patient:  [name],[name]   Account Number:  [id number]  Date Initiated:  02/18/2014  Documentation initiated by:  [facility]AB[facility],Lejend Dalby  Subjective/Objective Assessment:   63 Y/O F ADMITTED W/ABD PAIN.SBO.HX:L&R BUTTOCK PRESSURE ULCERS,W/C BOUND.     Action/Plan:   FROM HOME W/SPOUSE.HAS CAREGIVER SUPPORT.   Anticipated DC Date:  02/24/2014   Anticipated DC Plan:  HOME/SELF CARE      DC Planning Services  CM consult      Choice offered to / List presented to:             Status of service:  In process, will continue to follow Medicare Important Message given?   (If response is "NO", the following Medicare IM given date fields will be blank) Date Medicare IM given:   Medicare IM given by:   Date Additional Medicare IM given:   Additional Medicare IM given by:    Discharge Disposition:    Per UR Regulation:  Reviewed for med. necessity/level of care/duration of stay  If discussed at Long Length of Stay Meetings, dates discussed:   02/23/2014    Comments:  02/23/14 Haddie Bruhl RN,BSN NCM [phone] PATIENT NOT FOR D/C TODAY.D/C PLAN HOME,LIKELY IN AM IF MEDICALLY STABLE.  02/18/14 Million Maharaj RN,BSN NCM [phone] [facility] FOLLOWING-L&R BUTTOCK PRESSURE ULCERS- WOUND CARE.

## 2014-02-18 NOTE — Progress Notes (Addendum)
INITIAL NUTRITION ASSESSMENT  DOCUMENTATION CODES Per approved criteria  -Not Applicable   INTERVENTION: -Recommend to obtain pt's current weight -Diet advancement per MD -Supplement as warranted -Will continue to monitor  NUTRITION DIAGNOSIS: Inadequate oral intake related to inability to eat  as evidenced by NPO status.   Goal: Pt to meet >/= 90% of their estimated nutrition needs    Monitor:  Diet order, total protein/energy intake, GI profile, labs, weights  Reason for Assessment: MST  63 y.o. female  Admitting Dx: SBO (small bowel obstruction)  ASSESSMENT: Pt is 63 yo female with known MS and chronic bilateral LE weakness, presenting to [facility] ED 02/17/2014 with main concern of 2 days duration of progressively worsening generalized abdominal pain, constant and sharp, 10/10 in severity, non radiating, associated with nausea, vomiting (3 episodes, non bloody) and distension, poor oral intake, and with no specific alleviating factors  -Pt resting during assessment, has NGT placed on suction with 250 ml output. NPO with diet advancement per MD d/t partial SBO. No surgical indications per MD -[facility] evaluated pt on 8/20, noted pt with stage III PU on right and left buttocks r/t pt's severe MS and subsequent bed bound/wheel bound state -Pt with 13 lbs weight loss from 08/2013 to 12/2013 per previous medical records (8% body weight loss in 3 months, severe for time frame). No current weight, recommended pt be re-weighed with appropriate scale -Evaluated by RD in 08/2013, was experiencing poor PO intake. Ensure Complete had been ordered BID for skin integrity and nutrient replenishment -Pt with poor PO intake pta d/t abd distension, nausea and vomiting per MD notes.  -Suspect pt at nutrition risk d/t poor PO pta, hx of unintentional weight loss, and pressure ulcers. Will monitor diet advancement and recommended supplements as warranted/tolerated   Height: Ht Readings from Last 1 Encounters:   01/15/14 5\' 2"  (1.575 m)    Weight: Wt Readings from Last 1 Encounters:  01/15/14 148 lb (67.132 kg)    Ideal Body Weight: 110 lbs  % Ideal Body Weight: 135% (wt from 12/2013)  Wt Readings from Last 10 Encounters:  01/15/14 148 lb (67.132 kg)  01/15/14 148 lb (67.132 kg)  09/03/13 161 lb (73.029 kg)  07/24/13 161 lb 13.1 oz (73.4 kg)  05/11/13 221 lb 6.4 oz (100.426 kg)  03/07/12 240 lb (108.863 kg)  02/06/12 234 lb (106.142 kg)  07/02/11 209 lb (94.802 kg)  07/02/11 209 lb (94.802 kg)    Usual Body Weight: 161 per previous medical records  % Usual Body Weight: 92%  BMI:  There is no weight on file to calculate BMI.  Estimated Nutritional Needs: Kcal: 1550-1750 Protein: 70-80 gram Fluid: >/=1500 ml/daily  Skin: WDL  Diet Order: NPO  EDUCATION NEEDS: -No education needs identified at this time   Intake/Output Summary (Last 24 hours) at 02/18/14 1508 Last data filed at 02/18/14 0625  Gross per 24 hour  Intake      0 ml  Output    650 ml  Net   -650 ml    Last BM: 8/16   Labs:   Recent Labs Lab 02/17/14 1642 02/17/14 2316 02/18/14 0500  NA 135* 134* 135*  K 4.9 5.2 5.3  CL 93* 94* 96  CO2 25 23 24   BUN 22 23 27*  CREATININE 1.06 0.96 1.37*  CALCIUM 10.3 10.1 9.8  MG  --  2.1  --   PHOS  --  5.1*  --   GLUCOSE 128* 146* 140*  CBG (last 3)   Recent Labs  02/17/14 2303 02/18/14 0830 02/18/14 1142  GLUCAP 154* 108* 97    Scheduled Meds: . antiseptic oral rinse  7 mL Mouth Rinse q12n4p  . aspirin EC  81 mg Oral Daily  . atorvastatin  20 mg Oral QHS  . ceFEPime (MAXIPIME) IV  1 g Intravenous STAT  . ceFEPime (MAXIPIME) IV  1 g Intravenous Q12H  . chlorhexidine  15 mL Mouth Rinse BID  . cyclobenzaprine  10 mg Oral QHS  . cyclobenzaprine  5 mg Oral TID WC  . darifenacin  7.5 mg Oral BID  . enoxaparin (LOVENOX) injection  40 mg Subcutaneous QHS  . gabapentin  800 mg Oral 4 times per day  . insulin aspart  0-9 Units Subcutaneous TID  WC  . levothyroxine  125 mcg Oral QAC breakfast  . multivitamin with minerals  1 tablet Oral Daily  . nystatin  1 Bottle Topical BID  . pantoprazole (PROTONIX) IV  40 mg Intravenous Q12H    Continuous Infusions:   Past Medical History  Diagnosis Date  . Urinary incontinence     neurogenic bladder--hx of uti's  . Thyroid disease   . Multiple sclerosis     pt is bedridden -does get up to recliner by hoyer lift; mentally alert, able to feed herself -no trouble swallowing. lives at home with husband and has 24 hour caregivers.  . Hypothyroidism   . Anxiety   . Blindness of right eye     complication of ms  . Cellulitis     hx of cellulitis in lower extremities -requiring multiple hospitalzations in the past--no problems in las 2 yrs  . Bedridden 09/02/2013  . High cholesterol   . Arthritis     old falls-with injuries to rt knee and both ankles and rt wrist--states she has some pain in these areas-probable arthritis  . Arthritis     "hands" (09/03/2013)  . UTI (lower urinary tract infection)   . Type II diabetes mellitus     diet control - no longer takes any diabetic meds  . GERD (gastroesophageal reflux disease)     nexium takes care  . Neuromuscular disorder     ms-not ambulatory-muscle spasms-and recent tremors left arm and left  leg have developed--dr. Daphane ShepherdMeyer in [place] is pt's neurologist    Past Surgical History  Procedure Laterality Date  . Cholecystectomy    . Tonsillectomy    . Orif ankle fracture Left 2005  . Wrist surgery Right   . Lymph gland excision    . Mediastinoscopy  04/24/10    for enlarged right paratracheal lymph node--surgery at [facility]-Dr. Burney--pt states she does not know the  what the pathology report showed.  . Adenoidectomy    . Fracture surgery    . Cystoscopy  07/02/2011    Procedure: CYSTOSCOPY;  Surgeon: Kathi LudwigSigmund I Tannenbaum, MD;  Location: [facility];  Service: Urology;  Laterality: N/A;  . Insertion of suprapubic catheter N/A 01/15/2014     Procedure: CYSTOSCOPY WITH BOTOX INJECTION/SUPRAPUBIC TUBE PLACEMENT;  Surgeon: Kathi LudwigSigmund I Tannenbaum, MD;  Location: [facility];  Service: Urology;  Laterality: N/A;    Lloyd HugerSarah F Latiana Tomei MS RD LDN Clinical Dietitian [phone]

## 2014-02-18 NOTE — Consult Note (Signed)
W[facility] wound consult note Reason for Consult: Stage III pressure ulcers to left and right buttocks near gluteal fold bilaterally.  Wound type:Pressure ulcers, present on admission.  Patient states they began prior to admission when she was wearing a brief at home and have failed to heal due to bed bound status. Patient has SP cath in place at this time.   Patient states she is turned and repositioned frequently at home.  Pressure Ulcer POA: Yes Measurement: 1 cm x 1 cm x 0.1 cm with 25% thin slough present in wound bed bilaterally to gluteal fold.  Wound bed:25 % slough, 75% pink and moist.  Drainage (amount, consistency, odor) Minimal, serosanguinous drainage.  Periwound: Intact.  Dressing procedure/placement/frequency: Cleanse ulcers to bilateral buttocks at gluteal fold with NS and pat gently dry.  Apply silicone bordered foam to open areas for protection, moisture absorption and to promote autolysis of minimal slough. Change ebery 3 days and PRN soilage.  Will not follow at this time.  Please re-consult if needed.  Maple Hudson RN BSN CWON Pager [phone]

## 2014-02-19 ENCOUNTER — Inpatient Hospital Stay (HOSPITAL_COMMUNITY): Payer: Medicare Other

## 2014-02-19 LAB — GLUCOSE, CAPILLARY
GLUCOSE-CAPILLARY: 81 mg/dL (ref 70–99)
Glucose-Capillary: 86 mg/dL (ref 70–99)
Glucose-Capillary: 76 mg/dL (ref 70–99)
Glucose-Capillary: 118 mg/dL — ABNORMAL HIGH (ref 70–99)
Glucose-Capillary: 66 mg/dL — ABNORMAL LOW (ref 70–99)

## 2014-02-19 LAB — CBC
HCT: 37.9 % (ref 36.0–46.0)
Hemoglobin: 12.4 g/dL (ref 12.0–15.0)
MCH: 29 pg (ref 26.0–34.0)
MCHC: 32.7 g/dL (ref 30.0–36.0)
MCV: 88.8 fL (ref 78.0–100.0)
PLATELETS: 295 10*3/uL (ref 150–400)
RBC: 4.27 MIL/uL (ref 3.87–5.11)
RDW: 14.2 % (ref 11.5–15.5)
WBC: 9.3 10*3/uL (ref 4.0–10.5)

## 2014-02-19 LAB — BASIC METABOLIC PANEL
ANION GAP: 10 (ref 5–15)
BUN: 18 mg/dL (ref 6–23)
CO2: 25 mEq/L (ref 19–32)
Calcium: 8.3 mg/dL — ABNORMAL LOW (ref 8.4–10.5)
Chloride: 103 mEq/L (ref 96–112)
Creatinine, Ser: 0.57 mg/dL (ref 0.50–1.10)
GFR calc Af Amer: >90 mL/min (ref >90)
GLUCOSE: 79 mg/dL (ref 70–99)
Potassium: 3.9 mEq/L (ref 3.7–5.3)
SODIUM: 138 meq/L (ref 137–147)

## 2014-02-19 LAB — URINE CULTURE
COLONY COUNT: NO GROWTH
Culture: NO GROWTH

## 2014-02-19 LAB — SAMPLE TO BLOOD BANK

## 2014-02-19 MED ORDER — SODIUM CHLORIDE 0.9 % IV SOLN
INTRAVENOUS | Status: AC
  Administered 2014-02-19: 16:00:00 via INTRAVENOUS

## 2014-02-19 MED ORDER — BISACODYL 10 MG RE SUPP
10.0000 mg | Freq: Once | RECTAL | Status: AC
Start: 2014-02-19 — End: 2014-02-19
  Administered 2014-02-19: 10 mg via RECTAL
  Filled 2014-02-19: qty 1

## 2014-02-19 MED ORDER — LIP MEDEX EX OINT
TOPICAL_OINTMENT | CUTANEOUS | Status: AC
  Administered 2014-02-19: 21:00:00
  Filled 2014-02-19: qty 7

## 2014-02-19 MED ORDER — PHENOL 1.4 % MT LIQD
1.0000 | OROMUCOSAL | Status: DC | PRN
  Filled 2014-02-19: qty 177

## 2014-02-19 MED ORDER — DEXTROSE 50 % IV SOLN
INTRAVENOUS | Status: AC
  Administered 2014-02-19: 50 mL
  Filled 2014-02-19: qty 50

## 2014-02-19 MED ORDER — DEXTROSE 50 % IV SOLN
50.0000 mL | Freq: Once | INTRAVENOUS | Status: AC | PRN
  Administered 2014-02-19: 50 mL via INTRAVENOUS

## 2014-02-19 NOTE — Consult Note (Signed)
[facility] wound consult note Reason for Consult: Asked to see again today for sacral pressure ulcer.  Patient is turned and assessed and sacrum is intact.  Barrier cream is in place.  SP cath continues to leak and Urology is being consulted due to underinflation.  Wound type: Sacrum at risk for skin breakdown, but intact at this time.  Stage III pressure ulcers to bilateral buttocks, assessed yesterday and present on admission.  Pressure Ulcer POA: Yes Measurement:none Wound NLZ:JQBH Drainage (amount, consistency, odor) None Periwound:No wound present Dressing procedure/placement/frequency: Continue barrier cream due to exposure to moisture and turn/reposition frequently.  Will not follow at this time.  Please re-consult if needed.  Maple Hudson RN BSN CWON Pager [phone]

## 2014-02-19 NOTE — Progress Notes (Signed)
Patient seen and examined.  X-rays show decrease small bowel distension and increased gas in colon.  She is slowly improving.  Will repeat x-rays tomorrow.

## 2014-02-19 NOTE — Progress Notes (Signed)
[facility] PROGRESS NOTE   Assessment/Plan: SBO (small bowel obstruction): - NPO, 1400 NG tube output. - ct abd and pelvis as below. Repeated abd x-ray is improved. - Surgery following. - Leukocytosis pending. - normal LDH and lactate. - Follow I and O's replete fluids. - minimize narcotics.  AKI (acute kidney injury) - most likely pre-renal. - decrease IV fluids, b-met pnending.  Multiple sclerosis / Functional quadriplegia  - With chronic bilateral LE weakness  - Wheelchair and bedbound   Malpositioned SPT due to balloon under inflation/ WBCin urine: - Now improved.  - Appriciate urology assistance. - This SPT can stay in place for 1 month. Continue monthly changes per protocol - Unlikely UTI she will be colonize due to her foley cath. Has remained afebrile, no pus or drainage from foley. - con antibiotics for now. BC and UC negative.   Leukocytosis - Unclear etiology has remained afebrile. - now resolved.  DM type 2 (diabetes mellitus, type 2) with complications of neuropathy  - 5.7 A1C - Continue Gabapentin  - Place temporarily in SSI.  Sacral decubitus ulcer, stage II : - Secondary to bed ridden state  - Wound care consulted pending.    Code Status: Full  Family Communication: Plan of care discussed with the patient  Disposition Plan: inpatient    Consultants: Urology surgery Procedures: Ct abd 8.19.2015: There is a transition to nondilated/ decompressed distal small bowel loops. This transition appears to be in the mid abdomen likely in the proximal ileum  Antibiotics: Meropenem.  HPI/Subjective: Pain improved.  Objective: Filed Vitals:   02/18/14 0623 02/18/14 1318 02/18/14 2146 02/19/14 0619  BP: 104/72 123/55 105/63 99/52  Pulse: 116 107 96 92  Temp: 97.6 F (36.4 C) 98.6 F (37 C) 97.6 F (36.4 C) 97.6 F (36.4 C)  TempSrc: Oral Oral Oral Oral  Resp: 18  18 17   SpO2: 95% 96% 97% 95%    Intake/Output Summary (Last 24  hours) at 02/19/14 0901 Last data filed at 02/19/14 1975  Gross per 24 hour  Intake      0 ml  Output    600 ml  Net   -600 ml   There were no vitals filed for this visit.  Exam:  General: Alert, awake, oriented x3, in no acute distress.  HEENT: No bruits, no goiter.  Heart: Regular rate and rhythm Lungs: Good air movement clear Abdomen: Soft, no tenderness , no rebound. No tenderness around catheter.  Data Reviewed: Basic Metabolic Panel:  Recent Labs Lab 02/17/14 1642 02/17/14 2316 02/18/14 0500  NA 135* 134* 135*  K 4.9 5.2 5.3  CL 93* 94* 96  CO2 25 23 24   GLUCOSE 128* 146* 140*  BUN 22 23 27*  CREATININE 1.06 0.96 1.37*  CALCIUM 10.3 10.1 9.8  MG  --  2.1  --   PHOS  --  5.1*  --    Liver Function Tests:  Recent Labs Lab 02/17/14 1642 02/17/14 2316 02/18/14 0500  AST 19 20 17   ALT 16 15 15   ALKPHOS 169* 167* 158*  BILITOT 0.8 0.9 1.0  PROT 7.9 7.8 7.5  ALBUMIN 3.8 3.8 3.5   No results found for this basename: LIPASE, AMYLASE,  in the last 168 hours No results found for this basename: AMMONIA,  in the last 168 hours CBC:  Recent Labs Lab 02/17/14 1642 02/17/14 2316 02/18/14 0500  WBC 22.6* 21.1* 23.4*  NEUTROABS 19.6* 18.1*  --   HGB 16.7* 16.7* 16.2*  HCT 49.9* 48.5* 48.3*  MCV 88.0 86.5 88.0  PLT 397 412* 443*   Cardiac Enzymes: No results found for this basename: CKTOTAL, CKMB, CKMBINDEX, TROPONINI,  in the last 168 hours BNP (last 3 results) No results found for this basename: PROBNP,  in the last 8760 hours CBG:  Recent Labs Lab 02/18/14 0830 02/18/14 1142 02/18/14 1623 02/18/14 2143 02/19/14 0735  GLUCAP 108* 97 94 92 86    Recent Results (from the past 240 hour(s))  URINE CULTURE     Status: None   Collection Time    02/18/14  2:11 AM      Result Value Ref Range Status   Specimen Description URINE, RANDOM   Final   Special Requests NONE   Final   Culture  Setup Time     Final   Value: 02/18/2014 09:01     Performed  at [facility]     Final   Value: NO GROWTH     Performed at [facility]   Culture     Final   Value: NO GROWTH     Performed at [facility]   Report Status 02/19/2014 FINAL   Final  CULTURE, BLOOD (ROUTINE X 2)     Status: None   Collection Time    02/18/14  9:57 AM      Result Value Ref Range Status   Specimen Description BLOOD RIGHT ARM   Final   Special Requests BOTTLES DRAWN AEROBIC AND ANAEROBIC 5CC   Final   Culture  Setup Time     Final   Value: 02/18/2014 12:21     Performed at [facility]   Culture     Final   Value:        BLOOD CULTURE RECEIVED NO GROWTH TO DATE CULTURE WILL BE HELD FOR 5 DAYS BEFORE ISSUING A FINAL NEGATIVE REPORT     Performed at [facility]   Report Status PENDING   Incomplete  CULTURE, BLOOD (ROUTINE X 2)     Status: None   Collection Time    02/18/14 10:05 AM      Result Value Ref Range Status   Specimen Description BLOOD RIGHT HAND   Final   Special Requests BOTTLES DRAWN AEROBIC ONLY 2CC   Final   Culture  Setup Time     Final   Value: 02/18/2014 12:22     Performed at [facility]   Culture     Final   Value:        BLOOD CULTURE RECEIVED NO GROWTH TO DATE CULTURE WILL BE HELD FOR 5 DAYS BEFORE ISSUING A FINAL NEGATIVE REPORT     Performed at [facility]   Report Status PENDING   Incomplete     Studies: Ct Abdomen Pelvis W Contrast  02/17/2014   ADDENDUM REPORT: 02/17/2014 20:17  ADDENDUM: The catheter noted in the pelvis is actually a suprapubic catheter which has pulled out of the bladder. On the sagittal sequence the tip of the catheter is approximately 11 mm from the dome of the bladder.   Electronically Signed   By: Kalman Jewels M.D.   On: 02/17/2014 20:17   02/17/2014   CLINICAL DATA:  Abdominal pain and distention.  EXAM: CT ABDOMEN AND PELVIS WITH CONTRAST  TECHNIQUE: Multidetector CT imaging of the abdomen and pelvis was performed using the standard  protocol following bolus administration of intravenous contrast.  CONTRAST:  10m  OMNIPAQUE IOHEXOL 300 MG/ML SOLN, 130m OMNIPAQUE IOHEXOL 300 MG/ML SOLN  COMPARISON:  03/20/2012.  FINDINGS: Lung bases:  Minimal dependent subpleural atelectasis. The heart is normal in size. No pericardial effusion. The distal esophagus is grossly normal.  CT abdomen:  The liver is unremarkable. No focal hepatic lesions or intrahepatic biliary dilatation. The gallbladder surgically absent. No common bile duct dilatation the pancreas is normal. A small duodenum diverticulum is noted near the pancreatic head. The spleen is within normal limits in size. No focal lesions. The adrenal glands and kidneys are unremarkable.  The stomach is mildly distended. The duodenum appears unremarkable. The small bowel is dilated and there are air-fluid levels consistent with obstruction. There is a transition to nondilated/ decompressed distal small bowel loops. This transition appears to be in the mid abdomen likely in the proximal ileum. It is best seen on series 2, image number 47 and series 5, image number 51. No mass is identified. This is likely due to adhesions. The colon is unremarkable. Scattered air and stool but no mass or inflammation.  CT pelvis: Stable large calcified fibroid on the left side. There is a peritoneal dialysis catheter in the pelvis. No significant free pelvic fluid collections. The bladder is unremarkable. Moderate stool in the rectum. The aorta and branch vessels are normal. No mesenteric or retroperitoneal mass or adenopathy  Bony structures: No significant findings. Moderate degenerative changes involving the hips and spine. No destructive bony changes.  IMPRESSION: Small bowel obstruction in the central mid abdomen likely due to adhesions as discussed above.  Stable large partially calcified fibroid.  Intrapelvic catheter is noted.  Electronically Signed: By: MKalman JewelsM.D. On: 02/17/2014 19:23   Dg Abd 2  Views  02/19/2014   CLINICAL DATA:  Abdominal pain.  Small bowel obstruction.  EXAM: ABDOMEN - 2 VIEW  COMPARISON:  02/18/2014  FINDINGS: Decubitus film shows no evidence for intraperitoneal free air. NG tube remains in place with tip overlying the mid stomach. The supine film shows interval increase in gaseous distention of the colon, likely representing the sigmoid segment. No definite dilated gas-filled small bowel on the current study. Stool is seen along the right and transverse colon.  Suprapubic tube is evident.  IMPRESSION: No definite gaseous small bowel dilatation on the current study although degree of gaseous colonic distention has increased in the interval.   Electronically Signed   By: EMisty StanleyM.D.   On: 02/19/2014 08:35   Dg Abd 2 Views  02/18/2014   CLINICAL DATA:  Abdominal pain  EXAM: ABDOMEN - 2 VIEW  COMPARISON:  CT abdomen and pelvis February 17, 2014  FINDINGS: Supine and left lateral decubitus images were obtained. Nasogastric tube tip and side port are in the stomach. There remain multiple loops of dilated bowel with occasional air-fluid levels. No free air. There is contrast in the urinary bladder. There is moderate stool in the colon. Left-sided calcified uterine leiomyoma noted.  IMPRESSION: There remains evidence of a degree of small bowel obstruction. Nasogastric tube tip and side port in stomach. No free air.   Electronically Signed   By: WLowella GripM.D.   On: 02/18/2014 11:43    Scheduled Meds: . antiseptic oral rinse  7 mL Mouth Rinse q12n4p  . aspirin EC  81 mg Oral Daily  . atorvastatin  20 mg Oral QHS  . ceFEPime (MAXIPIME) IV  1 g Intravenous Q12H  . chlorhexidine  15 mL Mouth Rinse BID  . cyclobenzaprine  10  mg Oral QHS  . cyclobenzaprine  5 mg Oral TID WC  . darifenacin  7.5 mg Oral BID  . enoxaparin (LOVENOX) injection  40 mg Subcutaneous QHS  . gabapentin  800 mg Oral 4 times per day  . insulin aspart  0-9 Units Subcutaneous TID WC  .  levothyroxine  125 mcg Oral QAC breakfast  . multivitamin with minerals  1 tablet Oral Daily  . nystatin  1 Bottle Topical BID  . pantoprazole (PROTONIX) IV  40 mg Intravenous Q12H   Continuous Infusions:     Charlynne Cousins  [facility] Pager [phone]. If 8PM-8AM, please contact night-coverage at [email or web address], password [name] 02/19/2014, 9:01 AM  LOS: 2 days    **Disclaimer: This note may have been dictated with voice recognition software. Similar sounding words can inadvertently be transcribed and this note may contain transcription errors which may not have been corrected upon publication of note.**

## 2014-02-19 NOTE — Progress Notes (Signed)
Patient ID: Ashley CroakCynthia A Stanley, female   DOB: 08-14-1950, 63 y.o.   MRN: [id number]    Subjective: Pt looks well today.  Only complaint is some pain around her SP tube.  Feels gassy, but hasn't passed anything yet.  Objective: Vital signs in last 24 hours: Temp:  [97.6 F (36.4 C)-98.6 F (37 C)] 97.6 F (36.4 C) (08/21 0619) Pulse Rate:  [92-107] 92 (08/21 0619) Resp:  [17-18] 17 (08/21 0619) BP: (99-123)/(52-63) 99/52 mmHg (08/21 0619) SpO2:  [95 %-97 %] 95 % (08/21 0619) Last BM Date: 02/14/14  Intake/Output from previous day: 08/20 0701 - 08/21 0700 In: -  Out: 600 [Emesis/NG output:600] Intake/Output this shift:    PE: Abd: soft, obese, NT except around her SP tube.  Few BS  Lab Results:   Recent Labs  02/17/14 2316 02/18/14 0500  WBC 21.1* 23.4*  HGB 16.7* 16.2*  HCT 48.5* 48.3*  PLT 412* 443*   BMET  Recent Labs  02/17/14 2316 02/18/14 0500  NA 134* 135*  K 5.2 5.3  CL 94* 96  CO2 23 24  GLUCOSE 146* 140*  BUN 23 27*  CREATININE 0.96 1.37*  CALCIUM 10.1 9.8   PT/INR  Recent Labs  02/17/14 2316  LABPROT 13.7  INR 1.05   CMP     Component Value Date/Time   NA 135* 02/18/2014 0500   K 5.3 02/18/2014 0500   CL 96 02/18/2014 0500   CO2 24 02/18/2014 0500   GLUCOSE 140* 02/18/2014 0500   BUN 27* 02/18/2014 0500   CREATININE 1.37* 02/18/2014 0500   CALCIUM 9.8 02/18/2014 0500   PROT 7.5 02/18/2014 0500   ALBUMIN 3.5 02/18/2014 0500   AST 17 02/18/2014 0500   ALT 15 02/18/2014 0500   ALKPHOS 158* 02/18/2014 0500   BILITOT 1.0 02/18/2014 0500   GFRNONAA 40* 02/18/2014 0500   GFRAA 47* 02/18/2014 0500   Lipase     Component Value Date/Time   LIPASE 26 06/03/2009 1411       Studies/Results: Ct Abdomen Pelvis W Contrast  02/17/2014   ADDENDUM REPORT: 02/17/2014 20:17  ADDENDUM: The catheter noted in the pelvis is actually a suprapubic catheter which has pulled out of the bladder. On the sagittal sequence the tip of the catheter is approximately 11 mm  from the dome of the bladder.   Electronically Signed   By: Loralie ChampagneMark  Gallerani M.D.   On: 02/17/2014 20:17   02/17/2014   CLINICAL DATA:  Abdominal pain and distention.  EXAM: CT ABDOMEN AND PELVIS WITH CONTRAST  TECHNIQUE: Multidetector CT imaging of the abdomen and pelvis was performed using the standard protocol following bolus administration of intravenous contrast.  CONTRAST:  50mL OMNIPAQUE IOHEXOL 300 MG/ML SOLN, 100mL OMNIPAQUE IOHEXOL 300 MG/ML SOLN  COMPARISON:  03/20/2012.  FINDINGS: Lung bases:  Minimal dependent subpleural atelectasis. The heart is normal in size. No pericardial effusion. The distal esophagus is grossly normal.  CT abdomen:  The liver is unremarkable. No focal hepatic lesions or intrahepatic biliary dilatation. The gallbladder surgically absent. No common bile duct dilatation the pancreas is normal. A small duodenum diverticulum is noted near the pancreatic head. The spleen is within normal limits in size. No focal lesions. The adrenal glands and kidneys are unremarkable.  The stomach is mildly distended. The duodenum appears unremarkable. The small bowel is dilated and there are air-fluid levels consistent with obstruction. There is a transition to nondilated/ decompressed distal small bowel loops. This transition appears to be in the  mid abdomen likely in the proximal ileum. It is best seen on series 2, image number 47 and series 5, image number 51. No mass is identified. This is likely due to adhesions. The colon is unremarkable. Scattered air and stool but no mass or inflammation.  CT pelvis: Stable large calcified fibroid on the left side. There is a peritoneal dialysis catheter in the pelvis. No significant free pelvic fluid collections. The bladder is unremarkable. Moderate stool in the rectum. The aorta and branch vessels are normal. No mesenteric or retroperitoneal mass or adenopathy  Bony structures: No significant findings. Moderate degenerative changes involving the hips and  spine. No destructive bony changes.  IMPRESSION: Small bowel obstruction in the central mid abdomen likely due to adhesions as discussed above.  Stable large partially calcified fibroid.  Intrapelvic catheter is noted.  Electronically Signed: By: Loralie Champagne M.D. On: 02/17/2014 19:23   Dg Abd 2 Views  02/18/2014   CLINICAL DATA:  Abdominal pain  EXAM: ABDOMEN - 2 VIEW  COMPARISON:  CT abdomen and pelvis February 17, 2014  FINDINGS: Supine and left lateral decubitus images were obtained. Nasogastric tube tip and side port are in the stomach. There remain multiple loops of dilated bowel with occasional air-fluid levels. No free air. There is contrast in the urinary bladder. There is moderate stool in the colon. Left-sided calcified uterine leiomyoma noted.  IMPRESSION: There remains evidence of a degree of small bowel obstruction. Nasogastric tube tip and side port in stomach. No free air.   Electronically Signed   By: Bretta Bang M.D.   On: 02/18/2014 11:43    Anti-infectives: Anti-infectives   Start     Dose/Rate Route Frequency Ordered Stop   02/18/14 1000  ceFEPIme (MAXIPIME) 1 g in dextrose 5 % 50 mL IVPB     1 g 100 mL/hr over 30 Minutes Intravenous Every 12 hours 02/17/14 2137     02/17/14 2145  ceFEPIme (MAXIPIME) 1 g in dextrose 5 % 50 mL IVPB     1 g 100 mL/hr over 30 Minutes Intravenous STAT 02/17/14 2136 02/18/14 2145       Assessment/Plan  1. PSBO 2. Chronic SP tube, malpositioned and repositioned 3. MS  Plan: 1. Will await abdominal films and blood work.  She had a decent amount of stool on her film yesterday.  May try a suppository today to stimulate some function.  No acute need for surgical intervention.   LOS: 2 days    Ayona Yniguez E 02/19/2014, 8:11 AM Pager: [phone]

## 2014-02-19 NOTE — Progress Notes (Signed)
Pt incontinent of urine despite suprapubic catheter, MD notified. Will continue to monitor pt.

## 2014-02-19 NOTE — Clinical Documentation Improvement (Addendum)
  Patient with Multiple Sclerosis, functional quadriplegia, bed and wheelchair bound admitted for SBO and displaced suprapubic catheter. Conflicting information is documented in the current medical record.  "Stage II sacral decubitus ulcer" documented in H&P.  WOC nurse assessment describes ulcer as "Stage III pressure ulcers to bilateral buttocks".  If you agree with the WOC Nurse assessment and documentation, please clarify the stage and location of the patient's pressure ulcer in your progress notes and discharge summary.   K+ 2.9 on 02/21/14.  Potassium Chloride 10 meq IVPB x 5 ordered on 02/21/14.  Please document a diagnosis, including treatment, representative of the abnormal lab results.  Thank You,  Jerral Ralph ,RN Clinical Documentation Specialist:  [phone] [facility]- Health Information Management

## 2014-02-20 ENCOUNTER — Other Ambulatory Visit (HOSPITAL_COMMUNITY): Payer: Medicare Other

## 2014-02-20 ENCOUNTER — Inpatient Hospital Stay (HOSPITAL_COMMUNITY): Payer: Medicare Other

## 2014-02-20 LAB — GLUCOSE, CAPILLARY
GLUCOSE-CAPILLARY: 71 mg/dL (ref 70–99)
Glucose-Capillary: 61 mg/dL — ABNORMAL LOW (ref 70–99)
Glucose-Capillary: 114 mg/dL — ABNORMAL HIGH (ref 70–99)
Glucose-Capillary: 78 mg/dL (ref 70–99)
Glucose-Capillary: 78 mg/dL (ref 70–99)
Glucose-Capillary: 75 mg/dL (ref 70–99)
Glucose-Capillary: 85 mg/dL (ref 70–99)
Glucose-Capillary: 98 mg/dL (ref 70–99)

## 2014-02-20 MED ORDER — DEXTROSE-NACL 5-0.45 % IV SOLN
INTRAVENOUS | Status: DC
  Administered 2014-02-20: 12:00:00 via INTRAVENOUS

## 2014-02-20 MED ORDER — DEXTROSE 50 % IV SOLN
1.0000 | Freq: Once | INTRAVENOUS | Status: AC
  Administered 2014-02-20: 50 mL via INTRAVENOUS

## 2014-02-20 MED ORDER — ACETAMINOPHEN 650 MG RE SUPP: 650.0000 mg | Freq: Four times a day (QID) | RECTAL | Status: DC | PRN

## 2014-02-20 MED ORDER — LEVOTHYROXINE SODIUM 100 MCG IV SOLR
62.5000 ug | Freq: Every day | INTRAVENOUS | Status: DC
  Administered 2014-02-21 – 2014-02-22 (×2): 62.5 ug via INTRAVENOUS
  Administered 2014-02-23: 11:00:00 via INTRAVENOUS
  Administered 2014-02-24: 62.5 ug via INTRAVENOUS
  Filled 2014-02-20 (×4): qty 5

## 2014-02-20 MED ORDER — DEXTROSE 50 % IV SOLN
INTRAVENOUS | Status: AC
Start: 2014-02-20 — End: 2014-02-20
  Filled 2014-02-20: qty 50

## 2014-02-20 MED ORDER — LACTATED RINGERS IV BOLUS (SEPSIS): 1000.0000 mL | Freq: Three times a day (TID) | INTRAVENOUS | Status: DC | PRN

## 2014-02-20 MED ORDER — MAGIC MOUTHWASH
15.0000 mL | Freq: Four times a day (QID) | ORAL | Status: DC | PRN
  Administered 2014-02-21: 15 mL via ORAL
  Filled 2014-02-20: qty 15

## 2014-02-20 MED ORDER — ALUM & MAG HYDROXIDE-SIMETH 200-200-20 MG/5ML PO SUSP
30.0000 mL | Freq: Four times a day (QID) | ORAL | Status: DC | PRN
  Administered 2014-02-21: 30 mL via ORAL
  Filled 2014-02-20: qty 30

## 2014-02-20 MED ORDER — DEXTROSE-NACL 5-0.45 % IV SOLN
INTRAVENOUS | Status: DC
  Administered 2014-02-20: 04:00:00 via INTRAVENOUS

## 2014-02-20 MED ORDER — BISACODYL 10 MG RE SUPP: 10.0000 mg | Freq: Two times a day (BID) | RECTAL | Status: DC | PRN

## 2014-02-20 MED ORDER — M.V.I. ADULT IV INJ
INTRAVENOUS | Status: DC
  Filled 2014-02-20 (×2): qty 1000

## 2014-02-20 MED ORDER — M.V.I. ADULT IV INJ: INJECTION | Freq: Once | INTRAVENOUS | Status: DC

## 2014-02-20 MED ORDER — MENTHOL 3 MG MT LOZG: 1.0000 | LOZENGE | OROMUCOSAL | Status: DC | PRN

## 2014-02-20 MED ORDER — LIP MEDEX EX OINT
1.0000 "application " | TOPICAL_OINTMENT | Freq: Two times a day (BID) | CUTANEOUS | Status: DC
  Administered 2014-02-20 – 2014-02-24 (×9): 1 via TOPICAL

## 2014-02-20 MED ORDER — BISACODYL 10 MG RE SUPP
10.0000 mg | Freq: Every day | RECTAL | Status: DC
  Administered 2014-02-20 – 2014-02-24 (×4): 10 mg via RECTAL
  Filled 2014-02-20 (×4): qty 1

## 2014-02-20 MED ORDER — DIPHENHYDRAMINE HCL 50 MG/ML IJ SOLN: 25.0000 mg | Freq: Four times a day (QID) | INTRAMUSCULAR | Status: DC | PRN

## 2014-02-20 NOTE — Progress Notes (Addendum)
[facility] PROGRESS NOTE   Assessment/Plan: SBO (small bowel obstruction): - NPO, 300 NG tube output. - ct abd and pelvis as below. Repeated abd x-ray is improved. - Surgery following. - Leukocytosis resolved. - minimize narcotics.  AKI (acute kidney injury) - most likely pre-renal. - change IV fluids to D5 and 0.45%NS  Hypokalemia: - Resolved with D5/0.45% and KCL.  Multiple sclerosis / Functional quadriplegia  - With chronic bilateral LE weakness  - Wheelchair and bedbound   Malpositioned SPT due to balloon under inflation/ WBCin urine: - Now improved.  - Appriciate urology assistance. - This SPT can stay in place for 1 month. Continue monthly changes per protocol - Unlikely UTI she will be colonize due to her foley cath. Has remained afebrile, no pus or drainage from foley. - D/c antibiotics. BC and UC negative.   Leukocytosis - Unclear etiology has remained afebrile. - now resolved.  DM type 2 (diabetes mellitus, type 2) with complications of neuropathy  - 5.7 A1C - Continue Gabapentin. - Place temporarily in SSI.  Sacral decubitus ulcer, stage II : - Secondary to bed ridden state  - Wound care consulted pending.   Code Status: Full  Family Communication: Plan of care discussed with the patient  Disposition Plan: inpatient    Consultants: Urology surgery Procedures: Ct abd 8.19.2015: There is a transition to nondilated/ decompressed distal small bowel loops. This transition appears to be in the mid abdomen likely in the proximal ileum  Antibiotics: Meropenem.  HPI/Subjective: Pain improved.  Objective: Filed Vitals:   02/19/14 0619 02/19/14 1405 02/19/14 2134 02/20/14 0605  BP: 99/52 108/47 128/62 127/52  Pulse: 92 93 83 82  Temp: 97.6 F (36.4 C) 97.7 F (36.5 C) 97.6 F (36.4 C) 98 F (36.7 C)  TempSrc: Oral Oral Oral Oral  Resp: 17 18 18 18   SpO2: 95% 94% 96% 99%    Intake/Output Summary (Last 24 hours) at 02/20/14  1001 Last data filed at 02/20/14 0700  Gross per 24 hour  Intake 1201.25 ml  Output    300 ml  Net 901.25 ml   There were no vitals filed for this visit.  Exam:  General: Alert, awake, oriented x3, in no acute distress.  HEENT: No bruits, no goiter.  Heart: Regular rate and rhythm Lungs: Good air movement clear Abdomen: Soft, no tenderness , no rebound. No tenderness around catheter.abdominal pain improved.  Data Reviewed: Basic Metabolic Panel:  Recent Labs Lab 02/17/14 1642 02/17/14 2316 02/18/14 0500 02/19/14 0855  NA 135* 134* 135* 138  K 4.9 5.2 5.3 3.9  CL 93* 94* 96 103  CO2 25 23 24 25   GLUCOSE 128* 146* 140* 79  BUN 22 23 27* 18  CREATININE 1.06 0.96 1.37* 0.57  CALCIUM 10.3 10.1 9.8 8.3*  MG  --  2.1  --   --   PHOS  --  5.1*  --   --    Liver Function Tests:  Recent Labs Lab 02/17/14 1642 02/17/14 2316 02/18/14 0500  AST 19 20 17   ALT 16 15 15   ALKPHOS 169* 167* 158*  BILITOT 0.8 0.9 1.0  PROT 7.9 7.8 7.5  ALBUMIN 3.8 3.8 3.5   No results found for this basename: LIPASE, AMYLASE,  in the last 168 hours No results found for this basename: AMMONIA,  in the last 168 hours CBC:  Recent Labs Lab 02/17/14 1642 02/17/14 2316 02/18/14 0500 02/19/14 0855  WBC 22.6* 21.1* 23.4* 9.3  NEUTROABS 19.6* 18.1*  --   --  HGB 16.7* 16.7* 16.2* 12.4  HCT 49.9* 48.5* 48.3* 37.9  MCV 88.0 86.5 88.0 88.8  PLT 397 412* 443* 295   Cardiac Enzymes: No results found for this basename: CKTOTAL, CKMB, CKMBINDEX, TROPONINI,  in the last 168 hours BNP (last 3 results) No results found for this basename: PROBNP,  in the last 8760 hours CBG:  Recent Labs Lab 02/19/14 1946 02/19/14 2011 02/20/14 0002 02/20/14 0401 02/20/14 0436  GLUCAP 66* 118* 71 61* 114*    Recent Results (from the past 240 hour(s))  URINE CULTURE     Status: None   Collection Time    02/18/14  2:11 AM      Result Value Ref Range Status   Specimen Description URINE, RANDOM   Final    Special Requests NONE   Final   Culture  Setup Time     Final   Value: 02/18/2014 09:01     Performed at [facility] Count     Final   Value: NO GROWTH     Performed at [facility]   Culture     Final   Value: NO GROWTH     Performed at [facility]   Report Status 02/19/2014 FINAL   Final  CULTURE, BLOOD (ROUTINE X 2)     Status: None   Collection Time    02/18/14  9:57 AM      Result Value Ref Range Status   Specimen Description BLOOD RIGHT ARM   Final   Special Requests BOTTLES DRAWN AEROBIC AND ANAEROBIC 5CC   Final   Culture  Setup Time     Final   Value: 02/18/2014 12:21     Performed at [facility]   Culture     Final   Value:        BLOOD CULTURE RECEIVED NO GROWTH TO DATE CULTURE WILL BE HELD FOR 5 DAYS BEFORE ISSUING A FINAL NEGATIVE REPORT     Performed at [facility]   Report Status PENDING   Incomplete  CULTURE, BLOOD (ROUTINE X 2)     Status: None   Collection Time    02/18/14 10:05 AM      Result Value Ref Range Status   Specimen Description BLOOD RIGHT HAND   Final   Special Requests BOTTLES DRAWN AEROBIC ONLY 2CC   Final   Culture  Setup Time     Final   Value: 02/18/2014 12:22     Performed at [facility]   Culture     Final   Value:        BLOOD CULTURE RECEIVED NO GROWTH TO DATE CULTURE WILL BE HELD FOR 5 DAYS BEFORE ISSUING A FINAL NEGATIVE REPORT     Performed at [facility]   Report Status PENDING   Incomplete     Studies: Dg Abd 2 Views  02/19/2014   CLINICAL DATA:  Abdominal pain.  Small bowel obstruction.  EXAM: ABDOMEN - 2 VIEW  COMPARISON:  02/18/2014  FINDINGS: Decubitus film shows no evidence for intraperitoneal free air. NG tube remains in place with tip overlying the mid stomach. The supine film shows interval increase in gaseous distention of the colon, likely representing the sigmoid segment. No definite dilated gas-filled small bowel on the current study. Stool is seen  along the right and transverse colon.  Suprapubic tube is evident.  IMPRESSION: No definite gaseous small bowel dilatation on the current study although degree of  gaseous colonic distention has increased in the interval.   Electronically Signed   By: Kennith Center M.D.   On: 02/19/2014 08:35   Dg Abd 2 Views  02/18/2014   CLINICAL DATA:  Abdominal pain  EXAM: ABDOMEN - 2 VIEW  COMPARISON:  CT abdomen and pelvis February 17, 2014  FINDINGS: Supine and left lateral decubitus images were obtained. Nasogastric tube tip and side port are in the stomach. There remain multiple loops of dilated bowel with occasional air-fluid levels. No free air. There is contrast in the urinary bladder. There is moderate stool in the colon. Left-sided calcified uterine leiomyoma noted.  IMPRESSION: There remains evidence of a degree of small bowel obstruction. Nasogastric tube tip and side port in stomach. No free air.   Electronically Signed   By: Bretta Bang M.D.   On: 02/18/2014 11:43    Scheduled Meds: . antiseptic oral rinse  7 mL Mouth Rinse q12n4p  . aspirin EC  81 mg Oral Daily  . atorvastatin  20 mg Oral QHS  . bisacodyl  10 mg Rectal Daily  . ceFEPime (MAXIPIME) IV  1 g Intravenous Q12H  . chlorhexidine  15 mL Mouth Rinse BID  . cyclobenzaprine  10 mg Oral QHS  . cyclobenzaprine  5 mg Oral TID WC  . darifenacin  7.5 mg Oral BID  . enoxaparin (LOVENOX) injection  40 mg Subcutaneous QHS  . gabapentin  800 mg Oral 4 times per day  . insulin aspart  0-9 Units Subcutaneous TID WC  . levothyroxine  125 mcg Oral QAC breakfast  . lip balm  1 application Topical BID  . multivitamin with minerals  1 tablet Oral Daily  . nystatin  1 Bottle Topical BID  . pantoprazole (PROTONIX) IV  40 mg Intravenous Q12H   Continuous Infusions:     Marinda Elk  [facility] Pager [phone]. If 8PM-8AM, please contact night-coverage at [email or web address], password [facility] 02/20/2014, 10:01 AM  LOS: 3 days     **Disclaimer: This note may have been dictated with voice recognition software. Similar sounding words can inadvertently be transcribed and this note may contain transcription errors which may not have been corrected upon publication of note.**

## 2014-02-20 NOTE — Progress Notes (Signed)
[facility]  [address] [place], [place] [address] Phone: [phone] FAX: [phone]    [name] [id number] March 30, 1951  CARE TEAM:  PCP: Alvester Chou, NP  Outpatient Care Team: Patient Care Team: Alvester Chou, NP as PCP - General (Nurse Practitioner)  Inpatient Treatment Team: Treatment Team: Attending Provider: Charlynne Cousins, MD; Attending Physician: Robbie Lis, MD; Consulting Physician: Nolon Nations, MD; Rounding Team: Suzan Garibaldi, MD; Registered Nurse: Claris Pong, RN; Technician: Jenell Milliner, NT; Registered Nurse: Jordan Likes, RN; Registered Nurse: Jeanie Sewer, RN; Technician: Patrice Paradise, [place]   Subjective:  Feels a little better Scant flatus ?leaking around suprapubic tube  Objective:  Vital signs:  Filed Vitals:   02/19/14 0619 02/19/14 1405 02/19/14 2134 02/20/14 0605  BP: 99/52 108/47 128/62 127/52  Pulse: 92 93 83 82  Temp: 97.6 F (36.4 C) 97.7 F (36.5 C) 97.6 F (36.4 C) 98 F (36.7 C)  TempSrc: Oral Oral Oral Oral  Resp: 17 18 18 18   SpO2: 95% 94% 96% 99%    Last BM Date: 02/14/14  Intake/Output   Yesterday:  08/21 0701 - 08/22 0700 In: 601.3 [I.V.:601.3] Out: 300 [Emesis/NG output:300] This shift:     Bowel function:  Flatus: scant  BM: ?smear  Drain: NGT bilious  Physical Exam:  General: Pt awake/alert/oriented x4 in no acute distress Eyes: PERRL, normal EOM.  Sclera clear.  No icterus Neuro: CN II-XII intact w/o focal sensory/motor deficits.  Plegia stale Lymph: No head/neck/groin lymphadenopathy Psych:  No delerium/psychosis/paranoia HENT: Normocephalic, Mucus membranes moist.  No thrush Neck: Supple, No tracheal deviation Chest: No chest wall pain w good excursion CV:  Pulses intact.  Regular rhythm MS: Normal AROM mjr joints.  No obvious deformity Abdomen: Soft.  Moderately distended.  Mildly tender diffuse.  No evidence of  peritonitis.  No incarcerated hernias. Ext:  SCDs BLE.  No mjr edema.  No cyanosis Skin: No petechiae / purpura   Problem List:   Principal Problem:   SBO (small bowel obstruction) Active Problems:   Multiple sclerosis   GERD (gastroesophageal reflux disease)   Hypothyroidism   Blindness of right eye   UTI (urinary tract infection)   DM type 2 (diabetes mellitus, type 2)   Sacral decubitus ulcer, stage II   Bedridden   Leukocytosis   Dyslipidemia   Neuropathic pain   AKI (acute kidney injury)   Assessment  [name]  63 y.o. female       PSBO in pt w h/o chronic constipation & MS / bedridden state  Plan:  -NGT -stop PO meds - won't work w ileus/SBO - defer to primary service how to adjust -check Xrays -add PR supp -pt will need aggressive Miralax bowel regimen once this SBO resolves -VTE prophylaxis- SCDs, etc -mobilize as tolerated to help recovery  The patient is stable.  There is no evidence of peritonitis, acute abdomen, nor shock.  There is no strong evidence of failure of improvement nor decline with current non-operative management.  Higher risk of surgery = wait longer.  Pt wants to hold off on surgery anyway.  There is no need for surgery at the present moment.  We will continue to follow.   Adin Hector, M.D., F.A.C.S. Gastrointestinal and Minimally Invasive Surgery [facility] [address] [place], [place] [address] [phone] Main / Paging   02/20/2014   Results:   Labs: Results  for orders placed during the hospital encounter of 02/17/14 (from the past 48 hour(s))  GLUCOSE, CAPILLARY     Status: Abnormal   Collection Time    02/18/14  8:30 AM      Result Value Ref Range   Glucose-Capillary 108 (*) 70 - 99 mg/dL   Comment 1 Notify RN    LACTIC ACID, PLASMA     Status: None   Collection Time    02/18/14  9:57 AM      Result Value Ref Range   Lactic Acid, Venous 1.7  0.5 - 2.2 mmol/L   LACTATE DEHYDROGENASE     Status: None   Collection Time    02/18/14  9:57 AM      Result Value Ref Range   LDH 159  94 - 250 U/L  CULTURE, BLOOD (ROUTINE X 2)     Status: None   Collection Time    02/18/14  9:57 AM      Result Value Ref Range   Specimen Description BLOOD RIGHT ARM     Special Requests BOTTLES DRAWN AEROBIC AND ANAEROBIC 5CC     Culture  Setup Time       Value: 02/18/2014 12:21     Performed at [facility]   Culture       Value:        BLOOD CULTURE RECEIVED NO GROWTH TO DATE CULTURE WILL BE HELD FOR 5 DAYS BEFORE ISSUING A FINAL NEGATIVE REPORT     Performed at [facility]   Report Status PENDING    CULTURE, BLOOD (ROUTINE X 2)     Status: None   Collection Time    02/18/14 10:05 AM      Result Value Ref Range   Specimen Description BLOOD RIGHT HAND     Special Requests BOTTLES DRAWN AEROBIC ONLY 2CC     Culture  Setup Time       Value: 02/18/2014 12:22     Performed at [facility]   Culture       Value:        BLOOD CULTURE RECEIVED NO GROWTH TO DATE CULTURE WILL BE HELD FOR 5 DAYS BEFORE ISSUING A FINAL NEGATIVE REPORT     Performed at [facility]   Report Status PENDING    GLUCOSE, CAPILLARY     Status: None   Collection Time    02/18/14 11:42 AM      Result Value Ref Range   Glucose-Capillary 97  70 - 99 mg/dL   Comment 1 Notify RN    GLUCOSE, CAPILLARY     Status: None   Collection Time    02/18/14  4:23 PM      Result Value Ref Range   Glucose-Capillary 94  70 - 99 mg/dL   Comment 1 Notify RN    GLUCOSE, CAPILLARY     Status: None   Collection Time    02/18/14  9:43 PM      Result Value Ref Range   Glucose-Capillary 92  70 - 99 mg/dL   Comment 1 Notify RN    GLUCOSE, CAPILLARY     Status: None   Collection Time    02/19/14  7:35 AM      Result Value Ref Range   Glucose-Capillary 86  70 - 99 mg/dL   Comment 1 Notify RN    SAMPLE TO BLOOD BANK     Status: None   Collection Time    02/19/14  8:54 AM       Result Value Ref Range   Blood Bank Specimen SAMPLE AVAILABLE FOR TESTING     Sample Expiration 26/83/4196    BASIC METABOLIC PANEL     Status: Abnormal   Collection Time    02/19/14  8:55 AM      Result Value Ref Range   Sodium 138  137 - 147 mEq/L   Potassium 3.9  3.7 - 5.3 mEq/L   Comment: DELTA CHECK NOTED     RESULTS VERIFIED VIA RECOLLECT   Chloride 103  96 - 112 mEq/L   CO2 25  19 - 32 mEq/L   Glucose, Bld 79  70 - 99 mg/dL   BUN 18  6 - 23 mg/dL   Creatinine, Ser 0.57  0.50 - 1.10 mg/dL   Comment: DELTA CHECK NOTED     RESULTS VERIFIED VIA RECOLLECT   Calcium 8.3 (*) 8.4 - 10.5 mg/dL   GFR calc non Af Amer >90  >90 mL/min   GFR calc Af Amer >90  >90 mL/min   Comment: (NOTE)     The eGFR has been calculated using the CKD EPI equation.     This calculation has not been validated in all clinical situations.     eGFR's persistently <90 mL/min signify possible Chronic Kidney     Disease.   Anion gap 10  5 - 15  CBC     Status: None   Collection Time    02/19/14  8:55 AM      Result Value Ref Range   WBC 9.3  4.0 - 10.5 K/uL   RBC 4.27  3.87 - 5.11 MIL/uL   Hemoglobin 12.4  12.0 - 15.0 g/dL   Comment: REPEATED TO VERIFY     DELTA CHECK NOTED     RESULTS VERIFIED VIA RECOLLECT   HCT 37.9  36.0 - 46.0 %   MCV 88.8  78.0 - 100.0 fL   MCH 29.0  26.0 - 34.0 pg   MCHC 32.7  30.0 - 36.0 g/dL   RDW 14.2  11.5 - 15.5 %   Platelets 295  150 - 400 K/uL   Comment: REPEATED TO VERIFY     DELTA CHECK NOTED     RESULTS VERIFIED VIA RECOLLECT  GLUCOSE, CAPILLARY     Status: None   Collection Time    02/19/14 11:52 AM      Result Value Ref Range   Glucose-Capillary 81  70 - 99 mg/dL   Comment 1 Notify RN    GLUCOSE, CAPILLARY     Status: None   Collection Time    02/19/14  4:29 PM      Result Value Ref Range   Glucose-Capillary 76  70 - 99 mg/dL  GLUCOSE, CAPILLARY     Status: Abnormal   Collection Time    02/19/14  7:46 PM      Result Value Ref Range    Glucose-Capillary 66 (*) 70 - 99 mg/dL   Comment 1 Notify RN    GLUCOSE, CAPILLARY     Status: Abnormal   Collection Time    02/19/14  8:11 PM      Result Value Ref Range   Glucose-Capillary 118 (*) 70 - 99 mg/dL   Comment 1 Notify RN    GLUCOSE, CAPILLARY     Status: None   Collection Time    02/20/14 12:02 AM      Result Value Ref Range   Glucose-Capillary 71  70 - 99 mg/dL   Comment 1 Notify RN    GLUCOSE, CAPILLARY     Status: Abnormal   Collection Time    02/20/14  4:01 AM      Result Value Ref Range   Glucose-Capillary 61 (*) 70 - 99 mg/dL  GLUCOSE, CAPILLARY     Status: Abnormal   Collection Time    02/20/14  4:36 AM      Result Value Ref Range   Glucose-Capillary 114 (*) 70 - 99 mg/dL    Imaging / Studies: Dg Abd 2 Views  02/19/2014   CLINICAL DATA:  Abdominal pain.  Small bowel obstruction.  EXAM: ABDOMEN - 2 VIEW  COMPARISON:  02/18/2014  FINDINGS: Decubitus film shows no evidence for intraperitoneal free air. NG tube remains in place with tip overlying the mid stomach. The supine film shows interval increase in gaseous distention of the colon, likely representing the sigmoid segment. No definite dilated gas-filled small bowel on the current study. Stool is seen along the right and transverse colon.  Suprapubic tube is evident.  IMPRESSION: No definite gaseous small bowel dilatation on the current study although degree of gaseous colonic distention has increased in the interval.   Electronically Signed   By: Misty Stanley M.D.   On: 02/19/2014 08:35   Dg Abd 2 Views  02/18/2014   CLINICAL DATA:  Abdominal pain  EXAM: ABDOMEN - 2 VIEW  COMPARISON:  CT abdomen and pelvis February 17, 2014  FINDINGS: Supine and left lateral decubitus images were obtained. Nasogastric tube tip and side port are in the stomach. There remain multiple loops of dilated bowel with occasional air-fluid levels. No free air. There is contrast in the urinary bladder. There is moderate stool in the colon.  Left-sided calcified uterine leiomyoma noted.  IMPRESSION: There remains evidence of a degree of small bowel obstruction. Nasogastric tube tip and side port in stomach. No free air.   Electronically Signed   By: Lowella Grip M.D.   On: 02/18/2014 11:43    Medications / Allergies: per chart  Antibiotics: Anti-infectives   Start     Dose/Rate Route Frequency Ordered Stop   02/18/14 1000  ceFEPIme (MAXIPIME) 1 g in dextrose 5 % 50 mL IVPB     1 g 100 mL/hr over 30 Minutes Intravenous Every 12 hours 02/17/14 2137     02/17/14 2145  ceFEPIme (MAXIPIME) 1 g in dextrose 5 % 50 mL IVPB     1 g 100 mL/hr over 30 Minutes Intravenous STAT 02/17/14 2136 02/18/14 2145       Note: Portions of this report may have been transcribed using voice recognition software. Every effort was made to ensure accuracy; however, inadvertent computerized transcription errors may be present.   Any transcriptional errors that result from this process are unintentional.

## 2014-02-21 ENCOUNTER — Inpatient Hospital Stay (HOSPITAL_COMMUNITY): Payer: Medicare Other

## 2014-02-21 LAB — BASIC METABOLIC PANEL
ANION GAP: 14 (ref 5–15)
Anion gap: 12 (ref 5–15)
BUN: 7 mg/dL (ref 6–23)
BUN: 5 mg/dL — ABNORMAL LOW (ref 6–23)
CALCIUM: 8.5 mg/dL (ref 8.4–10.5)
CALCIUM: 8.8 mg/dL (ref 8.4–10.5)
CO2: 26 meq/L (ref 19–32)
CO2: 23 mEq/L (ref 19–32)
CREATININE: 0.5 mg/dL (ref 0.50–1.10)
CREATININE: 0.43 mg/dL — AB (ref 0.50–1.10)
Chloride: 104 mEq/L (ref 96–112)
Chloride: 102 mEq/L (ref 96–112)
GFR calc Af Amer: >90 mL/min (ref >90)
GFR calc non Af Amer: >90 mL/min (ref >90)
GLUCOSE: 87 mg/dL (ref 70–99)
Glucose, Bld: 95 mg/dL (ref 70–99)
Potassium: 2.9 mEq/L — CL (ref 3.7–5.3)
Potassium: 3.6 mEq/L — ABNORMAL LOW (ref 3.7–5.3)
Sodium: 142 mEq/L (ref 137–147)
Sodium: 139 mEq/L (ref 137–147)

## 2014-02-21 LAB — GLUCOSE, CAPILLARY
GLUCOSE-CAPILLARY: 90 mg/dL (ref 70–99)
GLUCOSE-CAPILLARY: 99 mg/dL (ref 70–99)
GLUCOSE-CAPILLARY: 85 mg/dL (ref 70–99)
GLUCOSE-CAPILLARY: 90 mg/dL (ref 70–99)
Glucose-Capillary: 81 mg/dL (ref 70–99)

## 2014-02-21 MED ORDER — POTASSIUM CHLORIDE 2 MEQ/ML IV SOLN
INTRAVENOUS | Status: DC
  Administered 2014-02-21 – 2014-02-22 (×2): via INTRAVENOUS
  Filled 2014-02-21 (×2): qty 1000

## 2014-02-21 MED ORDER — POTASSIUM CHLORIDE 10 MEQ/100ML IV SOLN
10.0000 meq | INTRAVENOUS | Status: AC
  Administered 2014-02-21 (×5): 10 meq via INTRAVENOUS
  Filled 2014-02-21 (×5): qty 100

## 2014-02-21 NOTE — Progress Notes (Signed)
CRITICAL VALUE ALERT  Critical value received: K= 2.9   Date of notification:  02/21/2014    Time of notification:  7:12am  Critical value read back:Yes.    Nurse who received alert:  Shayne Alken, RN  MD notified (1st page):  David Stall   Time of first page:  8:01a  MD notified (2nd page):  Time of second page:  Responding MD:  David Stall  Time MD responded:  8:03a

## 2014-02-21 NOTE — Progress Notes (Signed)
Patient ID: [name], female   DOB: July 17, 1950, 63 y.o.   MRN: [id number]  General Surgery - [facility] - Progress Note  Subjective: Patient without complaints.  Passing lots of flatus, moderate BM with suppository yesterday per patient.  Denies pain.  Denies nausea or emesis.  Objective: Vital signs in last 24 hours: Temp:  [97.7 F (36.5 C)] 97.7 F (36.5 C) 03-18-23 0512) Pulse Rate:  [78-80] 78 03-18-23 0512) Resp:  [18] 18 2023-03-18 0512) BP: (121-135)/(57-61) 135/57 mmHg 03/18/23 0512) SpO2:  [96 %-97 %] 97 % 03/18/2023 0512) Last BM Date: 02/19/14  Intake/Output from previous day: 08/22 0701 - 2023/03/18 0700 In: 1800 [I.V.:1800] Out: -   Exam: HEENT - clear, not icteric Neck - soft Chest - clear bilaterally Cor - RRR, no murmur Abd - soft, obese; no distension; suprapubic tube site clear with dressing; active BS present; minimal tenderness to palpation; no mass; no guarding Ext - no significant edema  Lab Results:   Recent Labs  02/19/14 0855  WBC 9.3  HGB 12.4  HCT 37.9  PLT 295     Recent Labs  02/19/14 0855 March 17, 2014 0515  NA 138 142  K 3.9 2.9*  CL 103 104  CO2 25 26  GLUCOSE 79 87  BUN 18 7  CREATININE 0.57 0.50  CALCIUM 8.3* 8.5    Studies/Results: Dg Abd 2 Views  March 17, 2014   CLINICAL DATA:  Abdominal pain ; possible extraluminal gas adjacent to the liver on a previous study  EXAM: ABDOMEN - 2 VIEW  COMPARISON:  Acute abdominal series of February 20, 2014  FINDINGS: The upright film reveals considerable stool burden within the colon. There are loops of mildly distended gas-filled small bowel in the mid abdomen. There is subtle lucency that projects along the lower surface of the liver which could reflect extraluminal gas. On the decubitus film definite extraluminal gas collections are not demonstrated but positioning is limited.  There is diffuse osteopenia. There surgical clips in the gallbladder fossa.  IMPRESSION: A small amount of  extraluminal gas is not excluded. Further evaluation with noncontrast CT scanning of the abdomen and pelvis would likely be the most useful next imaging step.  These results will be called to the ordering clinician or representative by the Radiologist Assistant, and communication documented in the PACS or zVision Dashboard.   Electronically Signed   By: David  [place]   On: 17-Mar-2014 09:16   Dg Abd Acute W/chest  02/20/2014   CLINICAL DATA:  Abdominal pain, evaluate bowel perforation  EXAM: ACUTE ABDOMEN SERIES (ABDOMEN 2 VIEW & CHEST 1 VIEW)  COMPARISON:  Prior acute abdominal series 821 2015  FINDINGS: Nasogastric tube is present. The tip overlies the upper stomach. The proximal side hole his likely just within the GE junction. Stable cardiac and mediastinal contours. The lungs remain clear. No free air under the diaphragm on the semi-erect view. There may be a small volume of extraluminal gas on the lateral decubitus view. A Foley catheter projects over the anatomic pelvis. Slightly decreased gaseous distension of the colon. Surgical clips in the right upper quadrant suggest prior cholecystectomy.  IMPRESSION: 1. Possible extraluminal gas adjacent to the liver on the lateral decubitus view. Recommend further evaluation with CT scan of the abdomen and pelvis. 2. Decrease gaseous distension of the colon. 3. Nasogastric tube in good position. 4. No acute cardiopulmonary process.   Electronically Signed   By: Malachy Moan M.D.   On: 02/20/2014 13:04  Assessment / Plan: 1.  Small bowel obstruction  Clinically improving - no evidence of acute intra-abdominal process  Continue NG today - NPO except for ice chips  Mobilize if at all possible - up to chair at least  Will follow with you - no role for surgical intervention at this point  Velora Heckler, MD, [facility] Office: [phone]  02/21/2014

## 2014-02-21 NOTE — Progress Notes (Signed)
[facility] PROGRESS NOTE   Assessment/Plan: SBO (small bowel obstruction): - NPO,  NG tube output none documented - ct abd and pelvis as below. Repeated abd x-ray is improved. - Surgery following. - Leukocytosis resolved. - minimize narcotics. - ambulate pt.  AKI (acute kidney injury): - resolved. - most likely pre-renal. - change IV fluids to D5 and 0.45%NS with K.  Multiple sclerosis / Functional quadriplegia  - With chronic bilateral LE weakness  - Wheelchair and bedbound   Malpositioned SPT due to balloon under inflation/ WBCin urine: - Now improved.  - Appriciate urology assistance. - This SPT can stay in place for 1 month. Continue monthly changes per protocol - Unlikely UTI she will be colonize due to her foley cath. Has remained afebrile, no pus or drainage from foley. - D/c antibiotics. BC and UC negative.   Leukocytosis - Unclear etiology has remained afebrile. Probably due to SBO. - now resolved.  DM type 2 (diabetes mellitus, type 2) with complications of neuropathy  - 5.7 A1C - Continue Gabapentin. - Place temporarily in SSI.  Sacral decubitus ulcer, stage II : - Secondary to bed ridden state  - Wound care consulted Continue barrier cream due to exposure to moisture and turn/reposition frequently.  Will not follow at this time.    Code Status: Full  Family Communication: Plan of care discussed with the patient  Disposition Plan: inpatient    Consultants: Urology surgery Procedures: Ct abd 8.19.2015: There is a transition to nondilated/ decompressed distal small bowel loops. This transition appears to be in the mid abdomen likely in the proximal ileum  Antibiotics: Meropenem.  HPI/Subjective: Pain improved.no complains  Objective: Filed Vitals:   02/20/14 0605 02/20/14 1000 02/20/14 2215 02/21/14 0512  BP: 127/52 147/86 121/61 135/57  Pulse: 82 81 80 78  Temp: 98 F (36.7 C) 97.9 F (36.6 C) 97.7 F (36.5 C) 97.7 F (36.5  C)  TempSrc: Oral Oral Oral Oral  Resp: SpO2: 99% 100% 96% 97%    Intake/Output Summary (Last 24 hours) at 02/21/14 1046 Last data filed at 02/21/14 0700  Gross per 24 hour  Intake   1800 ml  Output      0 ml  Net   1800 ml   There were no vitals filed for this visit.  Exam:  General: Alert, awake, oriented x3, in no acute distress.  HEENT: No bruits, no goiter.  Heart: Regular rate and rhythm Lungs: Good air movement clear Abdomen: Soft, no tenderness , no rebound. No tenderness around catheter.abdominal pain improved.  Data Reviewed: Basic Metabolic Panel:  Recent Labs Lab 02/17/14 1642 02/17/14 2316 02/18/14 0500 02/19/14 0855 02/21/14 0515  NA 135* 134* 135* 138 142  K 4.9 5.2 5.3 3.9 2.9*  CL 93* 94* 96 103 104  CO2 GLUCOSE 128* 146* 140* 79 87  BUN 22 23 27* 18 7  CREATININE 1.06 0.96 1.37* 0.57 0.50  CALCIUM 10.3 10.1 9.8 8.3* 8.5  MG  --  2.1  --   --   --   PHOS  --  5.1*  --   --   --    Liver Function Tests:  Recent Labs Lab 02/17/14 1642 02/17/14 2316 02/18/14 0500  AST ALT ALKPHOS 169* 167* 158*  BILITOT 0.8 0.9 1.0  PROT 7.9 7.8 7.5  ALBUMIN 3.8 3.8 3.5   No results found for this  basename: LIPASE, AMYLASE,  in the last 168 hours No results found for this basename: AMMONIA,  in the last 168 hours CBC:  Recent Labs Lab 02/17/14 1642 02/17/14 2316 02/18/14 0500 02/19/14 0855  WBC 22.6* 21.1* 23.4* 9.3  NEUTROABS 19.6* 18.1*  --   --   HGB 16.7* 16.7* 16.2* 12.4  HCT 49.9* 48.5* 48.3* 37.9  MCV 88.0 86.5 88.0 88.8  PLT 397 412* 443* 295   Cardiac Enzymes: No results found for this basename: CKTOTAL, CKMB, CKMBINDEX, TROPONINI,  in the last 168 hours BNP (last 3 results) No results found for this basename: PROBNP,  in the last 8760 hours CBG:  Recent Labs Lab 02/20/14 1210 02/20/14 1717 02/20/14 2151 02/21/14 0543 02/21/14 0745  GLUCAP 75 85 98 90 99    Recent Results  (from the past 240 hour(s))  URINE CULTURE     Status: None   Collection Time    02/18/14  2:11 AM      Result Value Ref Range Status   Specimen Description URINE, RANDOM   Final   Special Requests NONE   Final   Culture  Setup Time     Final   Value: 02/18/2014 09:01     Performed at [facility] Count     Final   Value: NO GROWTH     Performed at [facility]   Culture     Final   Value: NO GROWTH     Performed at [facility]   Report Status 02/19/2014 FINAL   Final  CULTURE, BLOOD (ROUTINE X 2)     Status: None   Collection Time    02/18/14  9:57 AM      Result Value Ref Range Status   Specimen Description BLOOD RIGHT ARM   Final   Special Requests BOTTLES DRAWN AEROBIC AND ANAEROBIC 5CC   Final   Culture  Setup Time     Final   Value: 02/18/2014 12:21     Performed at [facility]   Culture     Final   Value:        BLOOD CULTURE RECEIVED NO GROWTH TO DATE CULTURE WILL BE HELD FOR 5 DAYS BEFORE ISSUING A FINAL NEGATIVE REPORT     Performed at [facility]   Report Status PENDING   Incomplete  CULTURE, BLOOD (ROUTINE X 2)     Status: None   Collection Time    02/18/14 10:05 AM      Result Value Ref Range Status   Specimen Description BLOOD RIGHT HAND   Final   Special Requests BOTTLES DRAWN AEROBIC ONLY 2CC   Final   Culture  Setup Time     Final   Value: 02/18/2014 12:22     Performed at [facility]   Culture     Final   Value:        BLOOD CULTURE RECEIVED NO GROWTH TO DATE CULTURE WILL BE HELD FOR 5 DAYS BEFORE ISSUING A FINAL NEGATIVE REPORT     Performed at [facility]   Report Status PENDING   Incomplete     Studies: Dg Abd 2 Views  02/21/2014   CLINICAL DATA:  Abdominal pain ; possible extraluminal gas adjacent to the liver on a previous study  EXAM: ABDOMEN - 2 VIEW  COMPARISON:  Acute abdominal series of February 20, 2014  FINDINGS: The upright film reveals considerable stool burden within the  colon.  There are loops of mildly distended gas-filled small bowel in the mid abdomen. There is subtle lucency that projects along the lower surface of the liver which could reflect extraluminal gas. On the decubitus film definite extraluminal gas collections are not demonstrated but positioning is limited.  There is diffuse osteopenia. There surgical clips in the gallbladder fossa.  IMPRESSION: A small amount of extraluminal gas is not excluded. Further evaluation with noncontrast CT scanning of the abdomen and pelvis would likely be the most useful next imaging step.  These results will be called to the ordering clinician or representative by the Radiologist Assistant, and communication documented in the PACS or zVision Dashboard.   Electronically Signed   By: David  [place]   On: 02/21/2014 09:16   Dg Abd Acute W/chest  02/20/2014   CLINICAL DATA:  Abdominal pain, evaluate bowel perforation  EXAM: ACUTE ABDOMEN SERIES (ABDOMEN 2 VIEW & CHEST 1 VIEW)  COMPARISON:  Prior acute abdominal series 821 2015  FINDINGS: Nasogastric tube is present. The tip overlies the upper stomach. The proximal side hole his likely just within the GE junction. Stable cardiac and mediastinal contours. The lungs remain clear. No free air under the diaphragm on the semi-erect view. There may be a small volume of extraluminal gas on the lateral decubitus view. A Foley catheter projects over the anatomic pelvis. Slightly decreased gaseous distension of the colon. Surgical clips in the right upper quadrant suggest prior cholecystectomy.  IMPRESSION: 1. Possible extraluminal gas adjacent to the liver on the lateral decubitus view. Recommend further evaluation with CT scan of the abdomen and pelvis. 2. Decrease gaseous distension of the colon. 3. Nasogastric tube in good position. 4. No acute cardiopulmonary process.   Electronically Signed   By: Malachy Moan M.D.   On: 02/20/2014 13:04    Scheduled Meds: . antiseptic oral rinse  7 mL  Mouth Rinse q12n4p  . bisacodyl  10 mg Rectal Daily  . chlorhexidine  15 mL Mouth Rinse BID  . cyclobenzaprine  10 mg Oral QHS  . cyclobenzaprine  5 mg Oral TID WC  . darifenacin  7.5 mg Oral BID  . banana bag IV 1000 mL   Intravenous Q24H  . enoxaparin (LOVENOX) injection  40 mg Subcutaneous QHS  . gabapentin  800 mg Oral 4 times per day  . insulin aspart  0-9 Units Subcutaneous TID WC  . levothyroxine  62.5 mcg Intravenous Daily  . lip balm  1 application Topical BID  . nystatin  1 Bottle Topical BID  . pantoprazole (PROTONIX) IV  40 mg Intravenous Q12H  . potassium chloride  10 mEq Intravenous Q1 Hr x 5   Continuous Infusions: . dextrose 5 % and 0.45% NaCl 100 mL/hr at 02/20/14 1138     FELIZ Rosine Beat  [facility] Pager [phone]. If 8PM-8AM, please contact night-coverage at [email or web address], password [facility] 02/21/2014, 10:46 AM  LOS: 4 days    **Disclaimer: This note may have been dictated with voice recognition software. Similar sounding words can inadvertently be transcribed and this note may contain transcription errors which may not have been corrected upon publication of note.**

## 2014-02-22 DIAGNOSIS — G35 Multiple sclerosis: Secondary | ICD-10-CM

## 2014-02-22 LAB — GLUCOSE, CAPILLARY
GLUCOSE-CAPILLARY: 99 mg/dL (ref 70–99)
Glucose-Capillary: 97 mg/dL (ref 70–99)
Glucose-Capillary: 96 mg/dL (ref 70–99)
Glucose-Capillary: 100 mg/dL — ABNORMAL HIGH (ref 70–99)

## 2014-02-22 LAB — BASIC METABOLIC PANEL WITH GFR
Anion gap: 10 (ref 5–15)
BUN: 4 mg/dL — ABNORMAL LOW (ref 6–23)
CO2: 25 meq/L (ref 19–32)
Calcium: 8.8 mg/dL (ref 8.4–10.5)
Chloride: 104 meq/L (ref 96–112)
Creatinine, Ser: 0.44 mg/dL — ABNORMAL LOW (ref 0.50–1.10)
GFR calc Af Amer: >90 mL/min
GFR calc non Af Amer: >90 mL/min
Glucose, Bld: 93 mg/dL (ref 70–99)
Potassium: 4.2 meq/L (ref 3.7–5.3)
Sodium: 139 meq/L (ref 137–147)

## 2014-02-22 NOTE — Progress Notes (Signed)
[facility] PROGRESS NOTE   Assessment/Plan: SBO (small bowel obstruction): - Allow clear advance as tolerate it. - ct abd and pelvis as below. Repeated abd x-ray is improved. - Surgery following. - Leukocytosis resolved. - minimize narcotics.  AKI (acute kidney injury): - resolved. - most likely pre-renal. - KVO D5 and 0.45%NS with K.  Multiple sclerosis / Functional quadriplegia  - With chronic bilateral LE weakness. - Wheelchair and bed bound.  Malpositioned SPT due to balloon under inflation/ WBCin urine: - Now improved.  - Appriciate urology assistance. - This SPT can stay in place for 1 month. Continue monthly changes per protocol. - Unlikely UTI she will be colonize due to her foley cath. Has remained afebrile, no pus or drainage from foley. - D/c antibiotics. BC and UC negative.   Leukocytosis - Unclear etiology has remained afebrile. Probably due to SBO. - now resolved.  DM type 2 (diabetes mellitus, type 2) with complications of neuropathy  - 5.7 A1C - Continue Gabapentin. - Place temporarily in SSI.  Sacral decubitus ulcer, stage II : - Secondary to bed ridden state  - Wound care consulted Continue barrier cream due to exposure to moisture and turn/reposition frequently.  Will not follow at this time.    Code Status: Full  Family Communication: Plan of care discussed with the patient  Disposition Plan: inpatient    Consultants: Urology surgery Procedures: Ct abd 8.19.2015: There is a transition to nondilated/ decompressed distal small bowel loops. This transition appears to be in the mid abdomen likely in the proximal ileum  Antibiotics: Meropenem.  HPI/Subjective: Pain improved.no complains  Objective: Filed Vitals:   02/21/14 0512 02/21/14 1344 02/21/14 2058 02/22/14 0512  BP: 135/57 144/67 139/61 131/65  Pulse: 78 79 86 75  Temp: 97.7 F (36.5 C) 98 F (36.7 C) 97.8 F (36.6 C) 97.6 F (36.4 C)  TempSrc: Oral Oral Oral  Oral  Resp: 18 18 18 18   SpO2: 97% 99% 99% 96%    Intake/Output Summary (Last 24 hours) at 02/22/14 4166 Last data filed at 02/21/14 2120  Gross per 24 hour  Intake 1162.5 ml  Output    100 ml  Net 1062.5 ml   There were no vitals filed for this visit.  Exam:  General: Alert, awake, oriented x3, in no acute distress.  HEENT: No bruits, no goiter.  Heart: Regular rate and rhythm Lungs: Good air movement clear Abdomen: Soft, no tenderness , no rebound. No tenderness around catheter.  Data Reviewed: Basic Metabolic Panel:  Recent Labs Lab 02/17/14 2316 02/18/14 0500 02/19/14 0855 02/21/14 0515 02/21/14 1130 02/22/14 0520  NA 134* 135* 138 142 139 139  K 5.2 5.3 3.9 2.9* 3.6* 4.2  CL 94* 96 103 104 102 104  CO2 23 24 25 26 23 25   GLUCOSE 146* 140* 79 87 95 93  BUN 23 27* 18 7 5* 4*  CREATININE 0.96 1.37* 0.57 0.50 0.43* 0.44*  CALCIUM 10.1 9.8 8.3* 8.5 8.8 8.8  MG 2.1  --   --   --   --   --   PHOS 5.1*  --   --   --   --   --    Liver Function Tests:  Recent Labs Lab 02/17/14 1642 02/17/14 2316 02/18/14 0500  AST 19 20 17   ALT 16 15 15   ALKPHOS 169* 167* 158*  BILITOT 0.8 0.9 1.0  PROT 7.9 7.8 7.5  ALBUMIN 3.8 3.8 3.5   No results found for this  basename: LIPASE, AMYLASE,  in the last 168 hours No results found for this basename: AMMONIA,  in the last 168 hours CBC:  Recent Labs Lab 02/17/14 1642 02/17/14 2316 02/18/14 0500 02/19/14 0855  WBC 22.6* 21.1* 23.4* 9.3  NEUTROABS 19.6* 18.1*  --   --   HGB 16.7* 16.7* 16.2* 12.4  HCT 49.9* 48.5* 48.3* 37.9  MCV 88.0 86.5 88.0 88.8  PLT 397 412* 443* 295   Cardiac Enzymes: No results found for this basename: CKTOTAL, CKMB, CKMBINDEX, TROPONINI,  in the last 168 hours BNP (last 3 results) No results found for this basename: PROBNP,  in the last 8760 hours CBG:  Recent Labs Lab 02/21/14 0745 02/21/14 1140 02/21/14 1703 02/21/14 2140 02/22/14 0735  GLUCAP 99 81 85 90 97    Recent Results  (from the past 240 hour(s))  URINE CULTURE     Status: None   Collection Time    02/18/14  2:11 AM      Result Value Ref Range Status   Specimen Description URINE, RANDOM   Final   Special Requests NONE   Final   Culture  Setup Time     Final   Value: 02/18/2014 09:01     Performed at [facility] Count     Final   Value: NO GROWTH     Performed at [facility]   Culture     Final   Value: NO GROWTH     Performed at [facility]   Report Status 02/19/2014 FINAL   Final  CULTURE, BLOOD (ROUTINE X 2)     Status: None   Collection Time    02/18/14  9:57 AM      Result Value Ref Range Status   Specimen Description BLOOD RIGHT ARM   Final   Special Requests BOTTLES DRAWN AEROBIC AND ANAEROBIC 5CC   Final   Culture  Setup Time     Final   Value: 02/18/2014 12:21     Performed at [facility]   Culture     Final   Value:        BLOOD CULTURE RECEIVED NO GROWTH TO DATE CULTURE WILL BE HELD FOR 5 DAYS BEFORE ISSUING A FINAL NEGATIVE REPORT     Performed at [facility]   Report Status PENDING   Incomplete  CULTURE, BLOOD (ROUTINE X 2)     Status: None   Collection Time    02/18/14 10:05 AM      Result Value Ref Range Status   Specimen Description BLOOD RIGHT HAND   Final   Special Requests BOTTLES DRAWN AEROBIC ONLY 2CC   Final   Culture  Setup Time     Final   Value: 02/18/2014 12:22     Performed at [facility]   Culture     Final   Value:        BLOOD CULTURE RECEIVED NO GROWTH TO DATE CULTURE WILL BE HELD FOR 5 DAYS BEFORE ISSUING A FINAL NEGATIVE REPORT     Performed at [facility]   Report Status PENDING   Incomplete     Studies: Dg Abd 2 Views  02/21/2014   CLINICAL DATA:  Abdominal pain ; possible extraluminal gas adjacent to the liver on a previous study  EXAM: ABDOMEN - 2 VIEW  COMPARISON:  Acute abdominal series of February 20, 2014  FINDINGS: The upright film reveals considerable stool burden within the  colon.  There are loops of mildly distended gas-filled small bowel in the mid abdomen. There is subtle lucency that projects along the lower surface of the liver which could reflect extraluminal gas. On the decubitus film definite extraluminal gas collections are not demonstrated but positioning is limited.  There is diffuse osteopenia. There surgical clips in the gallbladder fossa.  IMPRESSION: A small amount of extraluminal gas is not excluded. Further evaluation with noncontrast CT scanning of the abdomen and pelvis would likely be the most useful next imaging step.  These results will be called to the ordering clinician or representative by the Radiologist Assistant, and communication documented in the PACS or zVision Dashboard.   Electronically Signed   By: David  [place]   On: 02/21/2014 09:16    Scheduled Meds: . antiseptic oral rinse  7 mL Mouth Rinse q12n4p  . bisacodyl  10 mg Rectal Daily  . chlorhexidine  15 mL Mouth Rinse BID  . cyclobenzaprine  10 mg Oral QHS  . cyclobenzaprine  5 mg Oral TID WC  . darifenacin  7.5 mg Oral BID  . enoxaparin (LOVENOX) injection  40 mg Subcutaneous QHS  . gabapentin  800 mg Oral 4 times per day  . insulin aspart  0-9 Units Subcutaneous TID WC  . levothyroxine  62.5 mcg Intravenous Daily  . lip balm  1 application Topical BID  . nystatin  1 Bottle Topical BID  . pantoprazole (PROTONIX) IV  40 mg Intravenous Q12H   Continuous Infusions: . dextrose 5 % and 0.45% NaCl 1,000 mL with potassium chloride 40 mEq infusion 75 mL/hr at 02/22/14 0120     Marinda Elk  [facility] Pager [phone]. If 8PM-8AM, please contact night-coverage at [email or web address], password [facility] 02/22/2014, 9:21 AM  LOS: 5 days    **Disclaimer: This note may have been dictated with voice recognition software. Similar sounding words can inadvertently be transcribed and this note may contain transcription errors which may not have been corrected upon publication of  note.**

## 2014-02-22 NOTE — Progress Notes (Signed)
Subjective: Alert. No distress. Oriented. Normal mental status. So she is having stools and passing flatus. Denies nausea or abdominal cramps. Slightly soft slower.. She says she's been hungry for 3 days. NG output minimal. Potassium 4.2. Creatinine 0.44. BUN 4.  Objective: Vital signs in last 24 hours: Temp:  [97.6 F (36.4 C)-98 F (36.7 C)] 97.6 F (36.4 C) (08/24 0512) Pulse Rate:  [75-86] 75 (08/24 0512) Resp:  [18] 18 (08/24 0512) BP: (131-144)/(61-67) 131/65 mmHg (08/24 0512) SpO2:  [96 %-99 %] 96 % (08/24 0512) Last BM Date: 02/20/14  Intake/Output from previous day: 03/17/2023 0701 - 08/24 0700 In: 1162.5 [I.V.:662.5; IV Piggyback:500] Out: 100 [Emesis/NG output:100] Intake/Output this shift:    General appearance: alert. Cooperative. Mental status normal. No distress. GI: Obese. Soft and nontender. Not distended. Bowel sounds present. No mass or hernia. SP tube site clean.  Lab Results:   Recent Labs  02/19/14 0855  WBC 9.3  HGB 12.4  HCT 37.9  PLT 295   BMET  Recent Labs  March 16, 2014 1130 02/22/14 0520  NA 139 139  K 3.6* 4.2  CL 102 104  CO2 23 25  GLUCOSE 95 93  BUN 5* 4*  CREATININE 0.43* 0.44*  CALCIUM 8.8 8.8   PT/INR No results found for this basename: LABPROT, INR,  in the last 72 hours ABG No results found for this basename: PHART, PCO2, PO2, HCO3,  in the last 72 hours  Studies/Results: Dg Abd 2 Views  2014-03-16   CLINICAL DATA:  Abdominal pain ; possible extraluminal gas adjacent to the liver on a previous study  EXAM: ABDOMEN - 2 VIEW  COMPARISON:  Acute abdominal series of February 20, 2014  FINDINGS: The upright film reveals considerable stool burden within the colon. There are loops of mildly distended gas-filled small bowel in the mid abdomen. There is subtle lucency that projects along the lower surface of the liver which could reflect extraluminal gas. On the decubitus film definite extraluminal gas collections are not demonstrated  but positioning is limited.  There is diffuse osteopenia. There surgical clips in the gallbladder fossa.  IMPRESSION: A small amount of extraluminal gas is not excluded. Further evaluation with noncontrast CT scanning of the abdomen and pelvis would likely be the most useful next imaging step.  These results will be called to the ordering clinician or representative by the Radiologist Assistant, and communication documented in the PACS or zVision Dashboard.   Electronically Signed   By: David  [place]   On: 03-16-14 09:16   Dg Abd Acute W/chest  02/20/2014   CLINICAL DATA:  Abdominal pain, evaluate bowel perforation  EXAM: ACUTE ABDOMEN SERIES (ABDOMEN 2 VIEW & CHEST 1 VIEW)  COMPARISON:  Prior acute abdominal series 821 2015  FINDINGS: Nasogastric tube is present. The tip overlies the upper stomach. The proximal side hole his likely just within the GE junction. Stable cardiac and mediastinal contours. The lungs remain clear. No free air under the diaphragm on the semi-erect view. There may be a small volume of extraluminal gas on the lateral decubitus view. A Foley catheter projects over the anatomic pelvis. Slightly decreased gaseous distension of the colon. Surgical clips in the right upper quadrant suggest prior cholecystectomy.  IMPRESSION: 1. Possible extraluminal gas adjacent to the liver on the lateral decubitus view. Recommend further evaluation with CT scan of the abdomen and pelvis. 2. Decrease gaseous distension of the colon. 3. Nasogastric tube in good position. 4. No acute cardiopulmonary process.   Electronically Signed  By: Malachy Moan M.D.   On: 02/20/2014 13:04    Anti-infectives: Anti-infectives   Start     Dose/Rate Route Frequency Ordered Stop   02/18/14 1000  ceFEPIme (MAXIPIME) 1 g in dextrose 5 % 50 mL IVPB  Status:  Discontinued     1 g 100 mL/hr over 30 Minutes Intravenous Every 12 hours 02/17/14 2137 02/20/14 1002   02/17/14 2145  ceFEPIme (MAXIPIME) 1 g in dextrose 5 %  50 mL IVPB     1 g 100 mL/hr over 30 Minutes Intravenous STAT 02/17/14 2136 02/18/14 2145      Assessment/Plan:  Small bowel obstruction.   Resolving. No evidence of acute intra-abdominal process. Discontinue NG. Begin clear liquids.  Would mobilize as much as possible.Involve PT. Will follow. No acute surgical problem identified.  Severe multiple sclerosis  Status post suprapubic tube with recent reinsertion for dislodgment  History cholecystectomy     LOS: 5 days    Maisie Hauser M 02/22/2014

## 2014-02-22 NOTE — Progress Notes (Signed)
PT Cancellation Note  Patient Details Name: [name] MRN: [id number] DOB: 1951/03/17   Cancelled Treatment:    Reason Eval/Treat Not Completed: PT screened, no needs identified, will sign off; chart reviewed, spoke with RN and pt; Pt reports she is truly bed bound and uses a hoyer lift at home for transfers; No PT needs.  Drucilla Chalet, PT Pager: [phone] 02/22/2014    Drucilla Chalet 02/22/2014, 11:07 AM

## 2014-02-23 ENCOUNTER — Inpatient Hospital Stay (HOSPITAL_COMMUNITY): Payer: Medicare Other

## 2014-02-23 LAB — BASIC METABOLIC PANEL
Anion gap: 10 (ref 5–15)
BUN: 7 mg/dL (ref 6–23)
CO2: 27 mEq/L (ref 19–32)
Calcium: 9.4 mg/dL (ref 8.4–10.5)
Chloride: 104 mEq/L (ref 96–112)
Creatinine, Ser: 0.65 mg/dL (ref 0.50–1.10)
GLUCOSE: 111 mg/dL — AB (ref 70–99)
Potassium: 4.4 mEq/L (ref 3.7–5.3)
Sodium: 141 mEq/L (ref 137–147)

## 2014-02-23 LAB — GLUCOSE, CAPILLARY
GLUCOSE-CAPILLARY: 102 mg/dL — AB (ref 70–99)
GLUCOSE-CAPILLARY: 101 mg/dL — AB (ref 70–99)
GLUCOSE-CAPILLARY: 125 mg/dL — AB (ref 70–99)
Glucose-Capillary: 100 mg/dL — ABNORMAL HIGH (ref 70–99)

## 2014-02-23 MED ORDER — IOHEXOL 300 MG/ML  SOLN
50.0000 mL | Freq: Once | INTRAMUSCULAR | Status: AC | PRN
  Administered 2014-02-23: 20 mL

## 2014-02-23 NOTE — Progress Notes (Signed)
Subjective: The patient reports continued drainage from urethra. PA irrigated foley and could not get irrigant back.  Review of history with [facility] shows that she did well for 1 month post intravesical Botox injection and suprapubic tube placement. No tube trauma. However, her problems began the night that her tube was changed by Home Health Nurse at 1 month post op. She noted several general malaise, Nausea, vomiting. She now has urine leaking from her urethra.   Objective: Vital signs in last 24 hours: Temp:  [97.1 F (36.2 C)-98.4 F (36.9 C)] 97.1 F (36.2 C) (08/25 1400) Pulse Rate:  [68-94] 68 (08/25 1400) Resp:  [16-18] 18 (08/25 1400) BP: (111-120)/(63-74) 118/74 mmHg (08/25 1400) SpO2:  [94 %-100 %] 100 % (08/25 1400) Weight:  [69.3 kg (152 lb 12.5 oz)] 69.3 kg (152 lb 12.5 oz) (08/25 0900)A  Intake/Output from previous day: 08/24 0701 - 08/25 0700 In: 480 [P.O.:480] Out: -  Intake/Output this shift:    Past Medical History  Diagnosis Date  . Urinary incontinence     neurogenic bladder--hx of uti's  . Thyroid disease   . Multiple sclerosis     pt is bedridden -does get up to recliner by hoyer lift; mentally alert, able to feed herself -no trouble swallowing. lives at home with husband and has 24 hour caregivers.  . Hypothyroidism   . Anxiety   . Blindness of right eye     complication of ms  . Cellulitis     hx of cellulitis in lower extremities -requiring multiple hospitalzations in the past--no problems in las 2 yrs  . Bedridden 09/02/2013  . High cholesterol   . Arthritis     old falls-with injuries to rt knee and both ankles and rt wrist--states she has some pain in these areas-probable arthritis  . Arthritis     "hands" (09/03/2013)  . UTI (lower urinary tract infection)   . Type II diabetes mellitus     diet control - no longer takes any diabetic meds  . GERD (gastroesophageal reflux disease)     nexium takes care  . Neuromuscular disorder     ms-not  ambulatory-muscle spasms-and recent tremors left arm and left  leg have developed--dr. Daphane Shepherd in [place] is pt's neurologist    Physical Exam:  Lungs - Normal respiratory effort, chest expands symmetrically.  Abdomen - Soft, non-tender & non-distended.  Lab Results: No results found for this basename: WBC, HGB, HCT,  in the last 72 hours BMET  Recent Labs  02/22/14 0520 02/23/14 0530  NA 139 141  K 4.2 4.4  CL 104 104  CO2 25 27  GLUCOSE 93 111*  BUN 4* 7  CREATININE 0.44* 0.65  CALCIUM 8.8 9.4   No results found for this basename: LABURIN,  in the last 72 hours Results for orders placed during the hospital encounter of 02/17/14  URINE CULTURE     Status: None   Collection Time    02/18/14  2:11 AM      Result Value Ref Range Status   Specimen Description URINE, RANDOM   Final   Special Requests NONE   Final   Culture  Setup Time     Final   Value: 02/18/2014 09:01     Performed at [facility] Count     Final   Value: NO GROWTH     Performed at [facility]   Culture     Final   Value: NO GROWTH  Performed at [facility]   Report Status 02/19/2014 FINAL   Final  CULTURE, BLOOD (ROUTINE X 2)     Status: None   Collection Time    02/18/14  9:57 AM      Result Value Ref Range Status   Specimen Description BLOOD RIGHT ARM   Final   Special Requests BOTTLES DRAWN AEROBIC AND ANAEROBIC 5CC   Final   Culture  Setup Time     Final   Value: 02/18/2014 12:21     Performed at [facility]   Culture     Final   Value:        BLOOD CULTURE RECEIVED NO GROWTH TO DATE CULTURE WILL BE HELD FOR 5 DAYS BEFORE ISSUING A FINAL NEGATIVE REPORT     Performed at [facility]   Report Status PENDING   Incomplete  CULTURE, BLOOD (ROUTINE X 2)     Status: None   Collection Time    02/18/14 10:05 AM      Result Value Ref Range Status   Specimen Description BLOOD RIGHT HAND   Final   Special Requests BOTTLES DRAWN AEROBIC ONLY  2CC   Final   Culture  Setup Time     Final   Value: 02/18/2014 12:22     Performed at [facility]   Culture     Final   Value:        BLOOD CULTURE RECEIVED NO GROWTH TO DATE CULTURE WILL BE HELD FOR 5 DAYS BEFORE ISSUING A FINAL NEGATIVE REPORT     Performed at [facility]   Report Status PENDING   Incomplete    Studies/Results: incontinence. Suprapubic bladder catheter placement 01/15/2014.  Recent repositioning of bladder catheter with leakage. Evaluate for  catheter position.  EXAM:  CYSTOGRAM  TECHNIQUE:  After catheterization of the urinary bladder following sterile  technique the bladder was filled with approximately 20 ml of  Omnipaque 300 by hand injection. Serial spot images were obtained  during bladder filling.  FLUOROSCOPY TIME: 23 seconds.  COMPARISON: radiographs 02/21/2014. Pelvic CT 02/17/2014.  FINDINGS:  The suprapubic bladder catheter tip is extravesicular within the  peritoneal cavity. The small amount of injected contrast outlines  bowel loops. The catheter was reconnected to the drainage bag.  IMPRESSION:  The suprapubic bladder catheter tip is outside of the bladder lumen.  These results were called by telephone at the time of interpretation  on 02/23/2014 at 4:19 pm to [facility] , who verbally  acknowledged these results.  Electronically Signed  By: Roxy Horseman M.D.  On: 02/23/2014 16:19  Assessment: S-p tube out. I have discussed this with the patient. She willneed foley catheter drainage, and d/c s-p tube. Anticipate drainage from abdominal wound for 24-48 hrs. Once stabilized, she will undergo elective cysto and repeat suprapubic tube placement in [facility].  Plan: 1. D/c s-p tube  2. Foley to straight drain 3. RTC upon discharge for repeat surgical planning for s-p tube placement 4. All future s-p tube changes to be done in the office-even though pt is bed and wheelchair bound.  ( no longer trust HHN to change tube).    Jethro Bolus I 02/23/2014, 8:47 PM

## 2014-02-23 NOTE — Progress Notes (Signed)
Subjective: Patient reports leaking urine from urethra and no urine draining from SPT.  On 02/17/14 a consult was requested for this pt due to malposition of her SP noted on CT scan.  This was addressed and her tube had been functioning without issue until today. She denies bladder fullness/pain.      Per consult on 02/17/14:  [name] is a 63 y.o. with MS and NGB who has been managed with a SPT for the last month. Yesterday, it was exchanged for the first time. There were no issues. Today, she was admitted for nausea/vomiting and CT abd/pelv showed a SBO as well as a SPT which had pulled back into the track. She has continued to drain yellow urine. She is not having any bladder fullness. She has not leaked out her urethra. No fevers. On the CT scan, her bladder is not overly filled.    Objective: Vital signs in last 24 hours: Temp:  [97.7 F (36.5 C)-98.4 F (36.9 C)] 97.8 F (36.6 C) (08/25 0851) Pulse Rate:  [76-94] 76 (08/25 0851) Resp:  [16-18] 16 (08/25 0851) BP: (111-120)/(63-72) 120/65 mmHg (08/25 0851) SpO2:  [94 %-100 %] 100 % (08/25 0851) Weight:  [69.3 kg (152 lb 12.5 oz)] 69.3 kg (152 lb 12.5 oz) (08/25 0900)  Intake/Output from previous day: 08/24 0701 - 08/25 0700 In: 480 [P.O.:480] Out: -  Intake/Output this shift: Total I/O In: 240 [P.O.:240] Out: 100 [Urine:100]  Physical Exam:  General:alert, cooperative and no distress GI: soft; SP site c/d/i with no erythema Sediment noted in foley tubing   Lab Results: No results found for this basename: HGB, HCT,  in the last 72 hours BMET  Recent Labs  02/22/14 0520 02/23/14 0530  NA 139 141  K 4.2 4.4  CL 104 104  CO2 25 27  GLUCOSE 93 111*  BUN 4* 7  CREATININE 0.44* 0.65  CALCIUM 8.8 9.4   No results found for this basename: LABPT, INR,  in the last 72 hours No results found for this basename: LABURIN,  in the last 72 hours Results for orders placed during the hospital encounter of 02/17/14   URINE CULTURE     Status: None   Collection Time    02/18/14  2:11 AM      Result Value Ref Range Status   Specimen Description URINE, RANDOM   Final   Special Requests NONE   Final   Culture  Setup Time     Final   Value: 02/18/2014 09:01     Performed at [facility] Count     Final   Value: NO GROWTH     Performed at [facility]   Culture     Final   Value: NO GROWTH     Performed at [facility]   Report Status 02/19/2014 FINAL   Final  CULTURE, BLOOD (ROUTINE X 2)     Status: None   Collection Time    02/18/14  9:57 AM      Result Value Ref Range Status   Specimen Description BLOOD RIGHT ARM   Final   Special Requests BOTTLES DRAWN AEROBIC AND ANAEROBIC 5CC   Final   Culture  Setup Time     Final   Value: 02/18/2014 12:21     Performed at [facility]   Culture     Final   Value:        BLOOD CULTURE RECEIVED NO GROWTH TO  DATE CULTURE WILL BE HELD FOR 5 DAYS BEFORE ISSUING A FINAL NEGATIVE REPORT     Performed at [facility]   Report Status PENDING   Incomplete  CULTURE, BLOOD (ROUTINE X 2)     Status: None   Collection Time    02/18/14 10:05 AM      Result Value Ref Range Status   Specimen Description BLOOD RIGHT HAND   Final   Special Requests BOTTLES DRAWN AEROBIC ONLY 2CC   Final   Culture  Setup Time     Final   Value: 02/18/2014 12:22     Performed at [facility]   Culture     Final   Value:        BLOOD CULTURE RECEIVED NO GROWTH TO DATE CULTURE WILL BE HELD FOR 5 DAYS BEFORE ISSUING A FINAL NEGATIVE REPORT     Performed at [facility]   Report Status PENDING   Incomplete    Studies/Results: No results found.  Procedure: foley balloon checked to ensure proper inflation.  12cc in balloon.  Easily deflated and re-inflated.  SPT repositioned and 60cc sterile saline easily instilled.  Fluid could not be returned.  Tube repositioned several times with no fluid return.  Foley reattached to bag.    Assessment/Plan:   Urethral urine leakage in pt with SPT that is not draining properly--will check cystogram via tube to eval tube position.    LOS: 6 days   [name] 02/23/2014, 3:02 PM

## 2014-02-23 NOTE — Progress Notes (Addendum)
[facility] PROGRESS NOTE   Assessment/Plan: SBO (small bowel obstruction): - Advance diet - ct abd and pelvis as below. Repeated abd x-ray is improved. - Surgery following. - Leukocytosis resolved. - minimize narcotics.  AKI (acute kidney injury): - resolved. - most likely pre-renal. - KVO D5 and 0.45%NS with K.  Multiple sclerosis / Functional quadriplegia  - With chronic bilateral LE weakness. - Wheelchair and bed bound. - back to SNF when stable.  Malpositioned SPT due to balloon under inflation/ WBCin urine: - Now improved.  - Appriciate urology assistance. - This SPT can stay in place for 1 month. Continue monthly changes per protocol. - Pt is having significant drainage from around her suprapubic catheter. - re-consult urology.   Leukocytosis - Unclear etiology has remained afebrile. Probably due to SBO. - now resolved.  DM type 2 (diabetes mellitus, type 2) with complications of neuropathy  - 5.7 A1C - Continue Gabapentin. - Place temporarily in SSI.  Sacral decubitus ulcer, stage II : - Secondary to bed ridden state  - Wound care consulted Continue barrier cream due to exposure to moisture and turn/reposition frequently.  Will not follow at this time.  Code Status: Full  Family Communication: Plan of care discussed with the patient  Disposition Plan: inpatient  Consultants: Urology surgery Procedures: Ct abd 8.19.2015: There is a transition to nondilated/ decompressed distal small bowel loops. This transition appears to be in the mid abdomen likely in the proximal ileum  Antibiotics: Meropenem.  HPI/Subjective: Pain improved, significant drainage around catheter.  Objective: Filed Vitals:   02/22/14 2145 02/23/14 0633 02/23/14 0851 02/23/14 0900  BP: 111/72 117/63 120/65   Pulse: 94 85 76   Temp: 98.4 F (36.9 C) 97.7 F (36.5 C) 97.8 F (36.6 C)   TempSrc: Oral Oral Oral   Resp: Height:      Weight:    69.3 kg (152  lb 12.5 oz)  SpO2: 94% 99% 100%     Intake/Output Summary (Last 24 hours) at 02/23/14 1228 Last data filed at 02/23/14 1100  Gross per 24 hour  Intake    480 ml  Output     50 ml  Net    430 ml   Filed Weights   02/22/14 1300 02/23/14 0900  Weight: 68 kg (149 lb 14.6 oz) 69.3 kg (152 lb 12.5 oz)    Exam:  General: Alert, awake, oriented x3, in no acute distress.  HEENT: No bruits, no goiter.  Heart: Regular rate and rhythm Lungs: Good air movement clear Abdomen: Soft, no tenderness , no rebound. No tenderness around catheter.  Data Reviewed: Basic Metabolic Panel:  Recent Labs Lab 02/17/14 2316  02/19/14 0855 02/21/14 0515 02/21/14 1130 02/22/14 0520 02/23/14 0530  NA 134*  < > 138 142 139 139 141  K 5.2  < > 3.9 2.9* 3.6* 4.2 4.4  CL 94*  < > 103 104 102 104 104  CO2 23  < > GLUCOSE 146*  < > 79 87 95 93 111*  BUN 23  < > 18 7 5* 4* 7  CREATININE 0.96  < > 0.57 0.50 0.43* 0.44* 0.65  CALCIUM 10.1  < > 8.3* 8.5 8.8 8.8 9.4  MG 2.1  --   --   --   --   --   --   PHOS 5.1*  --   --   --   --   --   --   < > =  values in this interval not displayed. Liver Function Tests:  Recent Labs Lab 02/17/14 1642 02/17/14 2316 02/18/14 0500  AST ALT ALKPHOS 169* 167* 158*  BILITOT 0.8 0.9 1.0  PROT 7.9 7.8 7.5  ALBUMIN 3.8 3.8 3.5   No results found for this basename: LIPASE, AMYLASE,  in the last 168 hours No results found for this basename: AMMONIA,  in the last 168 hours CBC:  Recent Labs Lab 02/17/14 1642 02/17/14 2316 02/18/14 0500 02/19/14 0855  WBC 22.6* 21.1* 23.4* 9.3  NEUTROABS 19.6* 18.1*  --   --   HGB 16.7* 16.7* 16.2* 12.4  HCT 49.9* 48.5* 48.3* 37.9  MCV 88.0 86.5 88.0 88.8  PLT 397 412* 443* 295   Cardiac Enzymes: No results found for this basename: CKTOTAL, CKMB, CKMBINDEX, TROPONINI,  in the last 168 hours BNP (last 3 results) No results found for this basename: PROBNP,  in the last 8760  hours CBG:  Recent Labs Lab 02/22/14 1140 02/22/14 1641 02/22/14 2132 02/23/14 0746 02/23/14 1217  GLUCAP 96 100* 99 102* 100*    Recent Results (from the past 240 hour(s))  URINE CULTURE     Status: None   Collection Time    02/18/14  2:11 AM      Result Value Ref Range Status   Specimen Description URINE, RANDOM   Final   Special Requests NONE   Final   Culture  Setup Time     Final   Value: 02/18/2014 09:01     Performed at [facility] Count     Final   Value: NO GROWTH     Performed at [facility]   Culture     Final   Value: NO GROWTH     Performed at [facility]   Report Status 02/19/2014 FINAL   Final  CULTURE, BLOOD (ROUTINE X 2)     Status: None   Collection Time    02/18/14  9:57 AM      Result Value Ref Range Status   Specimen Description BLOOD RIGHT ARM   Final   Special Requests BOTTLES DRAWN AEROBIC AND ANAEROBIC 5CC   Final   Culture  Setup Time     Final   Value: 02/18/2014 12:21     Performed at [facility]   Culture     Final   Value:        BLOOD CULTURE RECEIVED NO GROWTH TO DATE CULTURE WILL BE HELD FOR 5 DAYS BEFORE ISSUING A FINAL NEGATIVE REPORT     Performed at [facility]   Report Status PENDING   Incomplete  CULTURE, BLOOD (ROUTINE X 2)     Status: None   Collection Time    02/18/14 10:05 AM      Result Value Ref Range Status   Specimen Description BLOOD RIGHT HAND   Final   Special Requests BOTTLES DRAWN AEROBIC ONLY 2CC   Final   Culture  Setup Time     Final   Value: 02/18/2014 12:22     Performed at [facility]   Culture     Final   Value:        BLOOD CULTURE RECEIVED NO GROWTH TO DATE CULTURE WILL BE HELD FOR 5 DAYS BEFORE ISSUING A FINAL NEGATIVE REPORT     Performed at [facility]   Report Status PENDING   Incomplete  Studies: No results found.  Scheduled Meds: . antiseptic oral rinse  7 mL Mouth Rinse q12n4p  . bisacodyl  10 mg Rectal Daily  .  chlorhexidine  15 mL Mouth Rinse BID  . cyclobenzaprine  10 mg Oral QHS  . cyclobenzaprine  5 mg Oral TID WC  . darifenacin  7.5 mg Oral BID  . enoxaparin (LOVENOX) injection  40 mg Subcutaneous QHS  . gabapentin  800 mg Oral 4 times per day  . insulin aspart  0-9 Units Subcutaneous TID WC  . levothyroxine  62.5 mcg Intravenous Daily  . lip balm  1 application Topical BID  . nystatin  1 Bottle Topical BID  . pantoprazole (PROTONIX) IV  40 mg Intravenous Q12H   Continuous Infusions:     Marinda Elk  [facility] Pager [phone]. If 8PM-8AM, please contact night-coverage at [email or web address], password [facility] 02/23/2014, 12:28 PM  LOS: 6 days    **Disclaimer: This note may have been dictated with voice recognition software. Similar sounding words can inadvertently be transcribed and this note may contain transcription errors which may not have been corrected upon publication of note.**

## 2014-02-23 NOTE — Progress Notes (Signed)
  Subjective: She is eating without issue, no Bm yesterday, she is suppose to get a suppository later this Am.    Objective: Vital signs in last 24 hours: Temp:  [97.7 F (36.5 C)-98.4 F (36.9 C)] 97.8 F (36.6 C) (08/25 0851) Pulse Rate:  [76-94] 76 (08/25 0851) Resp:  [16-20] 16 (08/25 0851) BP: (111-120)/(57-72) 120/65 mmHg (08/25 0851) SpO2:  [94 %-100 %] 100 % (08/25 0851) Weight:  [68 kg (149 lb 14.6 oz)-69.3 kg (152 lb 12.5 oz)] 69.3 kg (152 lb 12.5 oz) (08/25 0900) Last BM Date: 02/20/14 480 Po recorded yesterday Afebrile VSS Soft diet Labs OK No film Intake/Output from previous day: 08/24 0701 - 08/25 0700 In: 480 [P.O.:480] Out: -  Intake/Output this shift:    General appearance: alert, cooperative, no distress and complains about catheter no being right. GI: soft, non-tender; bowel sounds very active; no masses,  no organomegaly and suprapubic catheter in place.  Lab Results:  No results found for this basename: WBC, HGB, HCT, PLT,  in the last 72 hours  BMET  Recent Labs  02/22/14 0520 02/23/14 0530  NA 139 141  K 4.2 4.4  CL 104 104  CO2 25 27  GLUCOSE 93 111*  BUN 4* 7  CREATININE 0.44* 0.65  CALCIUM 8.8 9.4   PT/INR No results found for this basename: LABPROT, INR,  in the last 72 hours   Recent Labs Lab 02/17/14 1642 02/17/14 2316 02/18/14 0500  AST 19 20 17   ALT 16 15 15   ALKPHOS 169* 167* 158*  BILITOT 0.8 0.9 1.0  PROT 7.9 7.8 7.5  ALBUMIN 3.8 3.8 3.5     Lipase     Component Value Date/Time   LIPASE 26 06/03/2009 1411     Studies/Results: No results found.  Medications: . antiseptic oral rinse  7 mL Mouth Rinse q12n4p  . bisacodyl  10 mg Rectal Daily  . chlorhexidine  15 mL Mouth Rinse BID  . cyclobenzaprine  10 mg Oral QHS  . cyclobenzaprine  5 mg Oral TID WC  . darifenacin  7.5 mg Oral BID  . enoxaparin (LOVENOX) injection  40 mg Subcutaneous QHS  . gabapentin  800 mg Oral 4 times per day  . insulin aspart  0-9  Units Subcutaneous TID WC  . levothyroxine  62.5 mcg Intravenous Daily  . lip balm  1 application Topical BID  . nystatin  1 Bottle Topical BID  . pantoprazole (PROTONIX) IV  40 mg Intravenous Q12H    Assessment/Plan SBO Acute renal insuffiencey Multiple sclerosis with functional quadriplegia Suprapubic cathether Prior cholecystectomy Hypothyroid    Plan:  From our standpoint she is tolerating PO's, we would like to see her have another bowel movement.      LOS: 6 days    Crawford Tamura 02/23/2014

## 2014-02-23 NOTE — Progress Notes (Signed)
General surgery attending note:  I've interviewed and examined this patient this afternoon. I agree with the assessment and treatment plan outlined by Mr. [name], [name]. The patient just had another bowel movement and feels very good from a GI standpoint. Tolerating regular diet.  Abdominal exam is benign.  Assessment/plan: SBO versus motility disorder, resolved Acute renal insufficiency Severe multiple sclerosis Suprapubic catheter Prior cholecystectomy Hypothyroid  We will sign off. Please reconsult as necessary.   [name]. Derrell Lolling, M.D., [facility] General and Minimally invasive Surgery Breast and Colorectal Surgery Office:   [phone] Pager:   [phone]

## 2014-02-24 LAB — CULTURE, BLOOD (ROUTINE X 2)
CULTURE: NO GROWTH
Culture: NO GROWTH

## 2014-02-24 LAB — CREATININE, SERUM
Creatinine, Ser: 0.58 mg/dL (ref 0.50–1.10)
GFR calc non Af Amer: >90 mL/min (ref >90)

## 2014-02-24 LAB — GLUCOSE, CAPILLARY
GLUCOSE-CAPILLARY: 96 mg/dL (ref 70–99)
GLUCOSE-CAPILLARY: 89 mg/dL (ref 70–99)

## 2014-02-24 MED ORDER — LEVOTHYROXINE SODIUM 125 MCG PO TABS
125.0000 ug | ORAL_TABLET | Freq: Every day | ORAL | Status: DC
  Filled 2014-02-24: qty 1

## 2014-02-24 MED ORDER — PANTOPRAZOLE SODIUM 40 MG PO TBEC
40.0000 mg | DELAYED_RELEASE_TABLET | Freq: Two times a day (BID) | ORAL | Status: DC
  Administered 2014-02-24: 40 mg via ORAL
  Filled 2014-02-24: qty 1

## 2014-02-24 NOTE — Progress Notes (Signed)
Patient is stable at discharge. Patient verbalized understanding of discharge instructions.

## 2014-02-24 NOTE — Progress Notes (Signed)
NUTRITION FOLLOW UP  Intervention:   - Pt eating well without any nutritional concerns, RD signing off   Nutrition Dx:   Inadequate oral intake related to inability to eat as evidenced by NPO status - resolved, diet advanced and pt eating well    Goal:   Pt to meet >/= 90% of their estimated nutrition needs - likely met based on pt's reported PO intake    Assessment:   Pt is 63 yo female with known MS and chronic bilateral LE weakness, presenting to [facility] ED 02/17/2014 with main concern of 2 days duration of progressively worsening generalized abdominal pain, constant and sharp, 10/10 in severity, non radiating, associated with nausea, vomiting (3 episodes, non bloody) and distension, poor oral intake, and with no specific alleviating factors   8/20: -Pt resting during assessment, has NGT placed on suction with 250 ml output. NPO with diet advancement per MD d/t partial SBO. No surgical indications per MD  -WOC evaluated pt on 8/20, noted pt with stage III PU on right and left buttocks r/t pt's severe MS and subsequent bed bound/wheel bound state  -Pt with 13 lbs weight loss from 08/2013 to 12/2013 per previous medical records (8% body weight loss in 3 months, severe for time frame). No current weight, recommended pt be re-weighed with appropriate scale  -Evaluated by RD in 08/2013, was experiencing poor PO intake. Ensure Complete had been ordered BID for skin integrity and nutrient replenishment  -Pt with poor PO intake pta d/t abd distension, nausea and vomiting per MD notes.  -Suspect pt at nutrition risk d/t poor PO pta, hx of unintentional weight loss, and pressure ulcers. Will monitor diet advancement and recommended supplements as warranted/tolerated  8/26: -NGT d/c 8/24 -Surgery notes yesterday documented pt's small bowel obstruction as resolved  -Diet advanced to low sodium heart healthy this morning -Met with pt who reports she's been eating 75% of her meals and denies any nutritional  concerns   Height: Ht Readings from Last 1 Encounters:  02/22/14 5' 2"  (1.575 m)    Weight Status:   Wt Readings from Last 1 Encounters:  02/24/14 152 lb 8.9 oz (69.2 kg)  Admit wt         149 lb 14.6 oz (68 kg)  Re-estimated needs:  Kcal: 1550-1750  Protein: 70-80 gram  Fluid: >/=1500 ml/daily   Skin: stage 2 sacral pressure ulcer  Diet Order: Criss Rosales   Intake/Output Summary (Last 24 hours) at 02/24/14 1033 Last data filed at 02/24/14 1000  Gross per 24 hour  Intake    480 ml  Output   1575 ml  Net  -1095 ml    Last BM: 8/22   Labs:   Recent Labs Lab 02/17/14 2316  02/21/14 1130 02/22/14 0520 02/23/14 0530 02/24/14 0530  NA 134*  < > 139 139 141  --   K 5.2  < > 3.6* 4.2 4.4  --   CL 94*  < > 102 104 104  --   CO2 23  < > 23 25 27   --   BUN 23  < > 5* 4* 7  --   CREATININE 0.96  < > 0.43* 0.44* 0.65 0.58  CALCIUM 10.1  < > 8.8 8.8 9.4  --   MG 2.1  --   --   --   --   --   PHOS 5.1*  --   --   --   --   --   GLUCOSE 146*  < >  95 93 111*  --   < > = values in this interval not displayed.  CBG (last 3)   Recent Labs  02/23/14 1621 02/23/14 2121 02/24/14 0738  GLUCAP 101* 125* 96    Scheduled Meds: . antiseptic oral rinse  7 mL Mouth Rinse q12n4p  . bisacodyl  10 mg Rectal Daily  . chlorhexidine  15 mL Mouth Rinse BID  . cyclobenzaprine  10 mg Oral QHS  . cyclobenzaprine  5 mg Oral TID WC  . darifenacin  7.5 mg Oral BID  . enoxaparin (LOVENOX) injection  40 mg Subcutaneous QHS  . gabapentin  800 mg Oral 4 times per day  . insulin aspart  0-9 Units Subcutaneous TID WC  . levothyroxine  62.5 mcg Intravenous Daily  . lip balm  1 application Topical BID  . nystatin  1 Bottle Topical BID  . pantoprazole  40 mg Oral BID     Carlis Stable MS, RD, LDN [phone] Pager [phone] Weekend/After Hours Pager

## 2014-02-24 NOTE — Progress Notes (Signed)
Clinical Social Work  Patient needed assistance with transportation home. CSW arranged transportation via [employer] per patient request who is aware of no guarantee of payment. Patient reports family is at home and aware that she is ready to DC. CSW verified address with patient. [facility] request #: [id number].  CSW is signing off but available if needed.  [place], [place] [phone]

## 2014-03-11 NOTE — Discharge Summary (Addendum)
Physician Discharge Summary  [name] ZOX:[id number] DOB: [birth year] DOA: 02/17/2014  PCP: [name], NP  Admit date: 02/17/2014 Discharge date: 02/24/2014  Time spent: >45 minutes   Discharge Condition: stable Diet recommendation: carb modified, heart healthy   Discharge Diagnoses:  Principal Problem:   SBO (small bowel obstruction) Active Problems:   Multiple sclerosis   GERD (gastroesophageal reflux disease)   Hypothyroidism   Blindness of right eye   UTI (urinary tract infection)   DM type 2 (diabetes mellitus, type 2)   Sacral decubitus ulcer, stage II   Bedridden   Leukocytosis   Dyslipidemia   Neuropathic pain   AKI (acute kidney injury)   History of present illness:  Pt is 63 yo female with known MS and chronic bilateral LE weakness, presenting to [facility] ED 02/17/2014 with main concern of 2 days duration of progressively worsening generalized abdominal pain, constant and sharp, 10/10 in severity, non radiating, associated with nausea, vomiting (3 episodes, non bloody) and distension, poor oral intake, and with no specific alleviating factors. Pt denies fevers, chills, no specific urinary concerns.No diarrhea or constipation. No blood in stool or urine.   Hospital Course:  SBO (small bowel obstruction):  - resolved with conservative management - ct abd and pelvis reveals SBO suspected to be from adhesions - Repeat abd x-ray is improved.   - Leukocytosis resolved.  - now tolerating a regular diet  AKI (acute kidney injury):  - resolved with IVF and therefore likely prerenal  Multiple sclerosis / Functional quadriplegia  - With chronic bilateral LE weakness.  - Wheelchair and bed bound.  - back to SNF when stable.   Malpositioned SPT due to balloon under inflation/ WBCin urine:  - catheter repositioned by urology but subsequently began having drainage from around her suprapubic catheter.  -urology decided to remove catheter and place foley.  - RTC in 1 wk  for repeat suprapubic tube placement   DM type 2 (diabetes mellitus, type 2) with complications of neuropathy  - 5.7 A1C   Sacral decubitus ulcer, stage II :  - Secondary to bed ridden state  - Wound care consulted Continue barrier cream due to exposure to moisture and turn/reposition frequently.   Consultations:  Urology   General surgery  Discharge Exam: Filed Weights   02/22/14 1300 02/23/14 0900 02/24/14 0500  Weight: 68 kg (149 lb 14.6 oz) 69.3 kg (152 lb 12.5 oz) 69.2 kg (152 lb 8.9 oz)   Filed Vitals:   02/24/14 0543  BP: 106/56  Pulse: 95  Temp: 98.1 F (36.7 C)  Resp: 18    General: AAO x 3, no distress Cardiovascular: RRR, no murmurs  Respiratory: clear to auscultation bilaterally GI: soft, non-tender, non-distended, bowel sound positive  Discharge Instructions You were cared for by a hospitalist during your hospital stay. If you have any questions about your discharge medications or the care you received while you were in the hospital after you are discharged, you can call the unit and asked to speak with the hospitalist on call if the hospitalist that took care of you is not available. Once you are discharged, your primary care physician will handle any further medical issues. Please note that NO REFILLS for any discharge medications will be authorized once you are discharged, as it is imperative that you return to your primary care physician (or establish a relationship with a primary care physician if you do not have one) for your aftercare needs so that they can reassess your need  for medications and monitor your lab values.  Discharge Instructions   Diet - low sodium heart healthy    Complete by:  As directed      Diet - low sodium heart healthy    Complete by:  As directed      Increase activity slowly    Complete by:  As directed      Increase activity slowly    Complete by:  As directed             Medication List         acetaminophen 500 MG  tablet  Commonly known as:  TYLENOL  Take 1,000 mg by mouth at bedtime as needed for mild pain.     aspirin EC 81 MG tablet  Take 81 mg by mouth daily.     atorvastatin 20 MG tablet  Commonly known as:  LIPITOR  Take 20 mg by mouth at bedtime.     CRANBERRY PO  Take 1 tablet by mouth daily as needed (for urinary pain).     cyclobenzaprine 5 MG tablet  Commonly known as:  FLEXERIL  Take 5-10 mg by mouth See admin instructions. Pt takes 5 mg three times daily and then 10 mg nightly at bedtime.     darifenacin 7.5 MG 24 hr tablet  Commonly known as:  ENABLEX  Take 7.5 mg by mouth 2 (two) times daily.     diphenhydrAMINE 25 MG tablet  Commonly known as:  BENADRYL  Take 50 mg by mouth every 6 (six) hours as needed for itching.     docusate sodium 100 MG capsule  Commonly known as:  COLACE  Take 100 mg by mouth daily as needed for mild constipation.     esomeprazole 40 MG capsule  Commonly known as:  NEXIUM  Take 40 mg by mouth every morning.     furosemide 20 MG tablet  Commonly known as:  LASIX  Take 20 mg by mouth 2 (two) times daily.     gabapentin 400 MG capsule  Commonly known as:  NEURONTIN  Take 800 mg by mouth 4 (four) times daily.     GAS-X EXTRA STRENGTH 125 MG chewable tablet  Generic drug:  simethicone  Chew 125 mg by mouth every 6 (six) hours as needed for flatulence.     levothyroxine 125 MCG tablet  Commonly known as:  SYNTHROID  Take 1 tablet (125 mcg total) by mouth daily before breakfast.     liver oil-zinc oxide 40 % ointment  Commonly known as:  DESITIN  Apply 1 application topically as needed for irritation.     meclizine 25 MG tablet  Commonly known as:  ANTIVERT  Take 25 mg by mouth 3 (three) times daily as needed for dizziness.     mineral oil-hydrophilic petrolatum ointment  Apply 1 application topically as needed for dry skin.     multivitamin with minerals Tabs tablet  Take 1 tablet by mouth daily.     nitrofurantoin 50 MG capsule   Commonly known as:  MACRODANTIN  Take 50 mg by mouth daily.     nystatin cream  Commonly known as:  MYCOSTATIN  Apply 1 application topically as needed for dry skin.     nystatin 100000 UNIT/GM Powd  Apply 1 Bottle topically 2 (two) times daily. To perineal/groin area and right great toe as needed for yeast     traMADol-acetaminophen 37.5-325 MG per tablet  Commonly known as:  ULTRACET  Take 1  tablet by mouth every 6 (six) hours as needed.     trolamine salicylate 10 % cream  Commonly known as:  ASPERCREME  Apply 1 application topically as needed for muscle pain.       Allergies  Allergen Reactions  . Avelox [Moxifloxacin Hcl In Nacl] Hives and Other (See Comments)    Panic attack  . Ciprofloxacin Hcl Hives and Other (See Comments)    Panic attack  . Penicillins Hives  . Shrimp [Shellfish Allergy] Other (See Comments)    flushing       Follow-up Information   Follow up with TANNENBAUM, SIGMUND I, MD In 1 week.   Specialty:  Urology   Contact information:   [address] [place] [address] [phone]        The results of significant diagnostics from this hospitalization (including imaging, microbiology, ancillary and laboratory) are listed below for reference.    Significant Diagnostic Studies: Ct Abdomen Pelvis W Contrast  02/17/2014   ADDENDUM REPORT: 02/17/2014 20:17  ADDENDUM: The catheter noted in the pelvis is actually a suprapubic catheter which has pulled out of the bladder. On the sagittal sequence the tip of the catheter is approximately 11 mm from the dome of the bladder.   Electronically Signed   By: Loralie Champagne M.D.   On: 02/17/2014 20:17   02/17/2014   CLINICAL DATA:  Abdominal pain and distention.  EXAM: CT ABDOMEN AND PELVIS WITH CONTRAST  TECHNIQUE: Multidetector CT imaging of the abdomen and pelvis was performed using the standard protocol following bolus administration of intravenous contrast.  CONTRAST:  45mL OMNIPAQUE IOHEXOL 300 MG/ML  SOLN, OMNIPAQUE IOHEXOL 300 MG/ML SOLN  COMPARISON:  03/20/2012.  FINDINGS: Lung bases:  Minimal dependent subpleural atelectasis. The heart is normal in size. No pericardial effusion. The distal esophagus is grossly normal.  CT abdomen:  The liver is unremarkable. No focal hepatic lesions or intrahepatic biliary dilatation. The gallbladder surgically absent. No common bile duct dilatation the pancreas is normal. A small duodenum diverticulum is noted near the pancreatic head. The spleen is within normal limits in size. No focal lesions. The adrenal glands and kidneys are unremarkable.  The stomach is mildly distended. The duodenum appears unremarkable. The small bowel is dilated and there are air-fluid levels consistent with obstruction. There is a transition to nondilated/ decompressed distal small bowel loops. This transition appears to be in the mid abdomen likely in the proximal ileum. It is best seen on series 2, image number 47 and series 5, image number 51. No mass is identified. This is likely due to adhesions. The colon is unremarkable. Scattered air and stool but no mass or inflammation.  CT pelvis: Stable large calcified fibroid on the left side. There is a peritoneal dialysis catheter in the pelvis. No significant free pelvic fluid collections. The bladder is unremarkable. Moderate stool in the rectum. The aorta and branch vessels are normal. No mesenteric or retroperitoneal mass or adenopathy  Bony structures: No significant findings. Moderate degenerative changes involving the hips and spine. No destructive bony changes.  IMPRESSION: Small bowel obstruction in the central mid abdomen likely due to adhesions as discussed above.  Stable large partially calcified fibroid.  Intrapelvic catheter is noted.  Electronically Signed: By: Loralie Champagne M.D. On: 02/17/2014 19:23   Dg Cystogram  02/23/2014   CLINICAL DATA:  Multiple sclerosis with chronic urinary incontinence. Suprapubic bladder  catheter placement 01/15/2014. Recent repositioning of bladder catheter with leakage. Evaluate for catheter position.  EXAM: CYSTOGRAM  TECHNIQUE: After catheterization of the urinary bladder following sterile technique the bladder was filled with approximately 20 ml of Omnipaque 300 by hand injection. Serial spot images were obtained during bladder filling.  FLUOROSCOPY TIME:  23 seconds.  COMPARISON:  radiographs 02/21/2014.  Pelvic CT 02/17/2014.  FINDINGS: The suprapubic bladder catheter tip is extravesicular within the peritoneal cavity. The small amount of injected contrast outlines bowel loops. The catheter was reconnected to the drainage bag.  IMPRESSION: The suprapubic bladder catheter tip is outside of the bladder lumen.  These results were called by telephone at the time of interpretation on 02/23/2014 at 4:19 pm to [facility] , who verbally acknowledged these results.   Electronically Signed   By: Roxy Horseman M.D.   On: 02/23/2014 16:19   Dg Abd 2 Views  02/21/2014   CLINICAL DATA:  Abdominal pain ; possible extraluminal gas adjacent to the liver on a previous study  EXAM: ABDOMEN - 2 VIEW  COMPARISON:  Acute abdominal series of February 20, 2014  FINDINGS: The upright film reveals considerable stool burden within the colon. There are loops of mildly distended gas-filled small bowel in the mid abdomen. There is subtle lucency that projects along the lower surface of the liver which could reflect extraluminal gas. On the decubitus film definite extraluminal gas collections are not demonstrated but positioning is limited.  There is diffuse osteopenia. There surgical clips in the gallbladder fossa.  IMPRESSION: A small amount of extraluminal gas is not excluded. Further evaluation with noncontrast CT scanning of the abdomen and pelvis would likely be the most useful next imaging step.  These results will be called to the ordering clinician or representative by the Radiologist Assistant, and  communication documented in the PACS or zVision Dashboard.   Electronically Signed   By: David  [place]   On: 02/21/2014 09:16   Dg Abd 2 Views  02/19/2014   CLINICAL DATA:  Abdominal pain.  Small bowel obstruction.  EXAM: ABDOMEN - 2 VIEW  COMPARISON:  02/18/2014  FINDINGS: Decubitus film shows no evidence for intraperitoneal free air. NG tube remains in place with tip overlying the mid stomach. The supine film shows interval increase in gaseous distention of the colon, likely representing the sigmoid segment. No definite dilated gas-filled small bowel on the current study. Stool is seen along the right and transverse colon.  Suprapubic tube is evident.  IMPRESSION: No definite gaseous small bowel dilatation on the current study although degree of gaseous colonic distention has increased in the interval.   Electronically Signed   By: Kennith Center M.D.   On: 02/19/2014 08:35   Dg Abd 2 Views  02/18/2014   CLINICAL DATA:  Abdominal pain  EXAM: ABDOMEN - 2 VIEW  COMPARISON:  CT abdomen and pelvis February 17, 2014  FINDINGS: Supine and left lateral decubitus images were obtained. Nasogastric tube tip and side port are in the stomach. There remain multiple loops of dilated bowel with occasional air-fluid levels. No free air. There is contrast in the urinary bladder. There is moderate stool in the colon. Left-sided calcified uterine leiomyoma noted.  IMPRESSION: There remains evidence of a degree of small bowel obstruction. Nasogastric tube tip and side port in stomach. No free air.   Electronically Signed   By: Bretta Bang M.D.   On: 02/18/2014 11:43   Dg Abd Acute W/chest  02/20/2014   CLINICAL DATA:  Abdominal pain, evaluate bowel perforation  EXAM: ACUTE ABDOMEN SERIES (ABDOMEN 2 VIEW &  CHEST 1 VIEW)  COMPARISON:  Prior acute abdominal series 821 2015  FINDINGS: Nasogastric tube is present. The tip overlies the upper stomach. The proximal side hole his likely just within the GE junction. Stable cardiac  and mediastinal contours. The lungs remain clear. No free air under the diaphragm on the semi-erect view. There may be a small volume of extraluminal gas on the lateral decubitus view. A Foley catheter projects over the anatomic pelvis. Slightly decreased gaseous distension of the colon. Surgical clips in the right upper quadrant suggest prior cholecystectomy.  IMPRESSION: 1. Possible extraluminal gas adjacent to the liver on the lateral decubitus view. Recommend further evaluation with CT scan of the abdomen and pelvis. 2. Decrease gaseous distension of the colon. 3. Nasogastric tube in good position. 4. No acute cardiopulmonary process.   Electronically Signed   By: Malachy Moan M.D.   On: 02/20/2014 13:04    Microbiology: No results found for this or any previous visit (from the past 240 hour(s)).   Labs: Basic Metabolic Panel: No results found for this basename: NA, K, CL, CO2, GLUCOSE, BUN, CREATININE, CALCIUM, MG, PHOS,  in the last 168 hours Liver Function Tests: No results found for this basename: AST, ALT, ALKPHOS, BILITOT, PROT, ALBUMIN,  in the last 168 hours No results found for this basename: LIPASE, AMYLASE,  in the last 168 hours No results found for this basename: AMMONIA,  in the last 168 hours CBC: No results found for this basename: WBC, NEUTROABS, HGB, HCT, MCV, PLT,  in the last 168 hours Cardiac Enzymes: No results found for this basename: CKTOTAL, CKMB, CKMBINDEX, TROPONINI,  in the last 168 hours BNP: BNP (last 3 results) No results found for this basename: PROBNP,  in the last 8760 hours CBG: No results found for this basename: GLUCAP,  in the last 168 hours     Signed:  Calvert Cantor, MD [facility]  02/24/2014, 5:47PM

## 2014-03-19 ENCOUNTER — Other Ambulatory Visit: Payer: Self-pay | Admitting: Urology

## 2014-03-23 ENCOUNTER — Encounter (HOSPITAL_COMMUNITY): Payer: Self-pay | Admitting: *Deleted

## 2014-04-05 ENCOUNTER — Encounter (HOSPITAL_COMMUNITY): Payer: Self-pay | Admitting: *Deleted

## 2014-04-08 MED ORDER — GENTAMICIN SULFATE 40 MG/ML IJ SOLN
290.0000 mg | INTRAVENOUS | Status: DC
  Filled 2014-04-08: qty 7.25

## 2014-04-09 ENCOUNTER — Ambulatory Visit (HOSPITAL_COMMUNITY): Payer: Medicare Other | Admitting: Anesthesiology

## 2014-04-09 ENCOUNTER — Encounter (HOSPITAL_COMMUNITY): Payer: Self-pay | Admitting: *Deleted

## 2014-04-09 ENCOUNTER — Ambulatory Visit (HOSPITAL_COMMUNITY)
Admission: RE | Admit: 2014-04-09 | Discharge: 2014-04-09 | Disposition: A | Discharge status: Home / Self Care | Payer: Medicare Other | Source: Ambulatory Visit | Attending: Urology | Admitting: Urology

## 2014-04-09 ENCOUNTER — Encounter (HOSPITAL_COMMUNITY): Payer: Medicare Other | Admitting: Anesthesiology

## 2014-04-09 ENCOUNTER — Encounter (HOSPITAL_COMMUNITY)
Admission: RE | Disposition: A | Discharge status: Home / Self Care | Payer: Self-pay | Source: Ambulatory Visit | Attending: Urology

## 2014-04-09 DIAGNOSIS — N3946 Mixed incontinence: Secondary | ICD-10-CM | POA: Insufficient documentation

## 2014-04-09 DIAGNOSIS — E119 Type 2 diabetes mellitus without complications: Secondary | ICD-10-CM | POA: Insufficient documentation

## 2014-04-09 DIAGNOSIS — F329 Major depressive disorder, single episode, unspecified: Secondary | ICD-10-CM | POA: Diagnosis not present

## 2014-04-09 DIAGNOSIS — R32 Unspecified urinary incontinence: Secondary | ICD-10-CM | POA: Insufficient documentation

## 2014-04-09 DIAGNOSIS — E039 Hypothyroidism, unspecified: Secondary | ICD-10-CM | POA: Insufficient documentation

## 2014-04-09 DIAGNOSIS — Z993 Dependence on wheelchair: Secondary | ICD-10-CM | POA: Insufficient documentation

## 2014-04-09 DIAGNOSIS — Z435 Encounter for attention to cystostomy: Secondary | ICD-10-CM | POA: Diagnosis not present

## 2014-04-09 DIAGNOSIS — Z888 Allergy status to other drugs, medicaments and biological substances status: Secondary | ICD-10-CM | POA: Diagnosis not present

## 2014-04-09 DIAGNOSIS — N39498 Other specified urinary incontinence: Secondary | ICD-10-CM

## 2014-04-09 DIAGNOSIS — Z881 Allergy status to other antibiotic agents status: Secondary | ICD-10-CM | POA: Insufficient documentation

## 2014-04-09 DIAGNOSIS — Z6827 Body mass index (BMI) 27.0-27.9, adult: Secondary | ICD-10-CM | POA: Diagnosis not present

## 2014-04-09 DIAGNOSIS — N3000 Acute cystitis without hematuria: Secondary | ICD-10-CM | POA: Insufficient documentation

## 2014-04-09 DIAGNOSIS — K219 Gastro-esophageal reflux disease without esophagitis: Secondary | ICD-10-CM | POA: Diagnosis not present

## 2014-04-09 DIAGNOSIS — F419 Anxiety disorder, unspecified: Secondary | ICD-10-CM | POA: Diagnosis not present

## 2014-04-09 DIAGNOSIS — Z880 Allergy status to penicillin: Secondary | ICD-10-CM | POA: Diagnosis not present

## 2014-04-09 DIAGNOSIS — M1990 Unspecified osteoarthritis, unspecified site: Secondary | ICD-10-CM | POA: Insufficient documentation

## 2014-04-09 DIAGNOSIS — G35 Multiple sclerosis: Secondary | ICD-10-CM | POA: Diagnosis present

## 2014-04-09 DIAGNOSIS — G35D Multiple sclerosis, unspecified: Secondary | ICD-10-CM

## 2014-04-09 HISTORY — PX: INSERTION OF SUPRAPUBIC CATHETER: SHX5870

## 2014-04-09 LAB — BASIC METABOLIC PANEL
ANION GAP: 13 (ref 5–15)
BUN: 20 mg/dL (ref 6–23)
CALCIUM: 9.9 mg/dL (ref 8.4–10.5)
CO2: 30 mEq/L (ref 19–32)
Chloride: 97 mEq/L (ref 96–112)
Creatinine, Ser: 0.52 mg/dL (ref 0.50–1.10)
GFR calc Af Amer: >90 mL/min (ref >90)
Glucose, Bld: 98 mg/dL (ref 70–99)
POTASSIUM: 3.8 meq/L (ref 3.7–5.3)
Sodium: 140 mEq/L (ref 137–147)

## 2014-04-09 LAB — CBC
HCT: 44.7 % (ref 36.0–46.0)
Hemoglobin: 14.8 g/dL (ref 12.0–15.0)
MCH: 29.7 pg (ref 26.0–34.0)
MCHC: 33.1 g/dL (ref 30.0–36.0)
MCV: 89.8 fL (ref 78.0–100.0)
Platelets: 329 10*3/uL (ref 150–400)
RBC: 4.98 MIL/uL (ref 3.87–5.11)
RDW: 13.9 % (ref 11.5–15.5)
WBC: 8.7 10*3/uL (ref 4.0–10.5)

## 2014-04-09 LAB — GLUCOSE, CAPILLARY: Glucose-Capillary: 116 mg/dL — ABNORMAL HIGH (ref 70–99)

## 2014-04-09 SURGERY — INSERTION, SUPRAPUBIC CATHETER
Anesthesia: General

## 2014-04-09 MED ORDER — HYDROMORPHONE HCL 1 MG/ML IJ SOLN
INTRAMUSCULAR | Status: AC
  Filled 2014-04-09: qty 1

## 2014-04-09 MED ORDER — TRAMADOL-ACETAMINOPHEN 37.5-325 MG PO TABS
1.0000 | ORAL_TABLET | Freq: Four times a day (QID) | ORAL | Status: DC | PRN
  Administered 2014-04-09: 1 via ORAL
  Filled 2014-04-09: qty 1

## 2014-04-09 MED ORDER — DEXAMETHASONE SODIUM PHOSPHATE 10 MG/ML IJ SOLN
INTRAMUSCULAR | Status: AC
  Filled 2014-04-09: qty 1

## 2014-04-09 MED ORDER — FENTANYL CITRATE 0.05 MG/ML IJ SOLN
INTRAMUSCULAR | Status: AC
  Filled 2014-04-09: qty 2

## 2014-04-09 MED ORDER — DEXAMETHASONE SODIUM PHOSPHATE 10 MG/ML IJ SOLN
INTRAMUSCULAR | Status: DC | PRN
  Administered 2014-04-09: 10 mg via INTRAVENOUS

## 2014-04-09 MED ORDER — LIDOCAINE-EPINEPHRINE (PF) 1 %-1:200000 IJ SOLN
INTRAMUSCULAR | Status: DC | PRN
  Administered 2014-04-09: 10 mL

## 2014-04-09 MED ORDER — MIDAZOLAM HCL 5 MG/5ML IJ SOLN
INTRAMUSCULAR | Status: DC | PRN
  Administered 2014-04-09: 2 mg via INTRAVENOUS

## 2014-04-09 MED ORDER — TRAMADOL-ACETAMINOPHEN 37.5-325 MG PO TABS: 1.0000 | ORAL_TABLET | Freq: Four times a day (QID) | ORAL | Status: DC | PRN

## 2014-04-09 MED ORDER — ONDANSETRON HCL 4 MG/2ML IJ SOLN
INTRAMUSCULAR | Status: DC | PRN
  Administered 2014-04-09: 4 mg via INTRAVENOUS

## 2014-04-09 MED ORDER — OXYCODONE HCL 5 MG PO TABS
5.0000 mg | ORAL_TABLET | Freq: Once | ORAL | Status: DC | PRN
Start: 2014-04-09 — End: 2014-04-09

## 2014-04-09 MED ORDER — LIDOCAINE HCL (CARDIAC) 20 MG/ML IV SOLN
INTRAVENOUS | Status: AC
  Filled 2014-04-09: qty 5

## 2014-04-09 MED ORDER — MEPERIDINE HCL 50 MG/ML IJ SOLN: 6.2500 mg | INTRAMUSCULAR | Status: DC | PRN

## 2014-04-09 MED ORDER — GENTAMICIN SULFATE 40 MG/ML IJ SOLN
340.0000 mg | Freq: Once | INTRAVENOUS | Status: AC
  Administered 2014-04-09: 340 mg via INTRAVENOUS
  Filled 2014-04-09: qty 8.5

## 2014-04-09 MED ORDER — MIDAZOLAM HCL 2 MG/2ML IJ SOLN
INTRAMUSCULAR | Status: AC
  Filled 2014-04-09: qty 2

## 2014-04-09 MED ORDER — OXYCODONE HCL 5 MG PO TABS: 5.0000 mg | ORAL_TABLET | Freq: Once | ORAL | Status: DC | PRN

## 2014-04-09 MED ORDER — HYDROMORPHONE HCL 1 MG/ML IJ SOLN: 0.2500 mg | INTRAMUSCULAR | Status: DC | PRN

## 2014-04-09 MED ORDER — PROMETHAZINE HCL 25 MG/ML IJ SOLN: 6.2500 mg | INTRAMUSCULAR | Status: DC | PRN

## 2014-04-09 MED ORDER — SODIUM CHLORIDE 0.9 % IR SOLN
Status: DC | PRN
  Administered 2014-04-09: 3000 mL via INTRAVESICAL

## 2014-04-09 MED ORDER — LIDOCAINE HCL (CARDIAC) 20 MG/ML IV SOLN
INTRAVENOUS | Status: DC | PRN
  Administered 2014-04-09: 100 mg via INTRAVENOUS

## 2014-04-09 MED ORDER — FENTANYL CITRATE 0.05 MG/ML IJ SOLN
INTRAMUSCULAR | Status: DC | PRN
  Administered 2014-04-09: 25 ug via INTRAVENOUS

## 2014-04-09 MED ORDER — OXYCODONE HCL 5 MG/5ML PO SOLN: 5.0000 mg | Freq: Once | ORAL | Status: DC | PRN

## 2014-04-09 MED ORDER — LACTATED RINGERS IV SOLN
INTRAVENOUS | Status: DC
  Administered 2014-04-09: 12:00:00 via INTRAVENOUS
  Administered 2014-04-09: 1000 mL via INTRAVENOUS

## 2014-04-09 MED ORDER — LIDOCAINE-EPINEPHRINE (PF) 1 %-1:200000 IJ SOLN
INTRAMUSCULAR | Status: AC
  Filled 2014-04-09: qty 10

## 2014-04-09 MED ORDER — PROPOFOL 10 MG/ML IV BOLUS
INTRAVENOUS | Status: DC | PRN
  Administered 2014-04-09: 120 mg via INTRAVENOUS

## 2014-04-09 MED ORDER — ONDANSETRON HCL 4 MG/2ML IJ SOLN
INTRAMUSCULAR | Status: AC
  Filled 2014-04-09: qty 2

## 2014-04-09 MED ORDER — PROPOFOL 10 MG/ML IV BOLUS
INTRAVENOUS | Status: AC
  Filled 2014-04-09: qty 20

## 2014-04-09 MED ORDER — HYDROMORPHONE HCL 1 MG/ML IJ SOLN
0.2500 mg | INTRAMUSCULAR | Status: DC | PRN
  Administered 2014-04-09: 0.25 mg via INTRAVENOUS

## 2014-04-09 SURGICAL SUPPLY — 22 items
BAG URINE DRAINAGE (UROLOGICAL SUPPLIES) ×3 IMPLANT
BAG URINE LEG 500ML (DRAIN) ×3 IMPLANT
BLADE SURG 15 STRL LF DISP TIS (BLADE) ×1 IMPLANT
BLADE SURG 15 STRL SS (BLADE) ×3
CATH SILASTIC FOLEY 18FRX5CC (CATHETERS) ×2 IMPLANT
COVER SURGICAL LIGHT HANDLE (MISCELLANEOUS) ×6 IMPLANT
ELECT REM PT RETURN 9FT ADLT (ELECTROSURGICAL) ×3
ELECTRODE REM PT RTRN 9FT ADLT (ELECTROSURGICAL) ×1 IMPLANT
GLOVE SURG SS PI 8.0 STRL IVOR (GLOVE) ×3 IMPLANT
GOWN STRL REUS W/TWL XL LVL3 (GOWN DISPOSABLE) ×6 IMPLANT
KIT SUPRAPUBIC CATH (MISCELLANEOUS) ×1 IMPLANT
MANIFOLD NEPTUNE II (INSTRUMENTS) ×3 IMPLANT
NEEDLE HYPO 22GX1.5 SAFETY (NEEDLE) IMPLANT
NS IRRIG 1000ML POUR BTL (IV SOLUTION) ×3 IMPLANT
PACK CYSTO (CUSTOM PROCEDURE TRAY) ×3 IMPLANT
PENCIL BUTTON HOLSTER BLD 10FT (ELECTRODE) IMPLANT
PLUG CATH AND CAP STER (CATHETERS) IMPLANT
SPONGE DRAIN TRACH 4X4 STRL 2S (GAUZE/BANDAGES/DRESSINGS) ×2 IMPLANT
SUT ETHILON 3 0 PS 1 (SUTURE) ×3 IMPLANT
TAPE CLOTH SURG 4X10 WHT LF (GAUZE/BANDAGES/DRESSINGS) ×2 IMPLANT
TOWEL OR 17X26 10 PK STRL BLUE (TOWEL DISPOSABLE) ×3 IMPLANT
WATER STERILE IRR 3000ML UROMA (IV SOLUTION) ×3 IMPLANT

## 2014-04-09 NOTE — Anesthesia Preprocedure Evaluation (Addendum)
Anesthesia Evaluation  Patient identified by MRN, date of birth, ID band Patient awake    Reviewed: Allergy & Precautions, H&P , NPO status , Patient's Chart, lab work & pertinent test results  Airway Mallampati: II TM Distance: >3 FB Neck ROM: Full    Dental  (+) Dental Advisory Given   Pulmonary neg pulmonary ROS,  breath sounds clear to auscultation        Cardiovascular negative cardio ROS  Rhythm:Regular Rate:Normal     Neuro/Psych PSYCHIATRIC DISORDERS Anxiety MS  Neuromuscular disease    GI/Hepatic Neg liver ROS, GERD-  Medicated,  Endo/Other  diabetes, Type 2Hypothyroidism   Renal/GU negative Renal ROS     Musculoskeletal negative musculoskeletal ROS (+)   Abdominal (+) + obese,   Peds  Hematology negative hematology ROS (+)   Anesthesia Other Findings   Reproductive/Obstetrics negative OB ROS                           Anesthesia Physical  Anesthesia Plan  ASA: III  Anesthesia Plan: General   Post-op Pain Management:    Induction: Intravenous  Airway Management Planned: LMA  Additional Equipment:   Intra-op Plan:   Post-operative Plan: Extubation in OR  Informed Consent: I have reviewed the patients History and Physical, chart, labs and discussed the procedure including the risks, benefits and alternatives for the proposed anesthesia with the patient or authorized representative who has indicated his/her understanding and acceptance.   Dental advisory given  Plan Discussed with: CRNA  Anesthesia Plan Comments:         Anesthesia Quick Evaluation                                  Anesthesia Evaluation  Patient identified by MRN, date of birth, ID band Patient awake    Reviewed: Allergy & Precautions, H&P , NPO status , Patient's Chart, lab work & pertinent test results  Airway Mallampati: II TM Distance: >3 FB Neck ROM: Full    Dental No notable  dental hx.    Pulmonary neg pulmonary ROS,  clear to auscultation  Pulmonary exam normal       Cardiovascular neg cardio ROS Regular Normal    Neuro/Psych PSYCHIATRIC DISORDERS Anxiety  Neuromuscular disease (Multiple sclerosis. bedridden) Negative Neurological ROS  Negative Psych ROS   GI/Hepatic negative GI ROS, Neg liver ROS, GERD-  ,  Endo/Other  Negative Endocrine ROSHypothyroidism   Renal/GU negative Renal ROS  Genitourinary negative   Musculoskeletal negative musculoskeletal ROS (+)   Abdominal   Peds negative pediatric ROS (+)  Hematology negative hematology ROS (+)   Anesthesia Other Findings   Reproductive/Obstetrics negative OB ROS                          Anesthesia Physical Anesthesia Plan  ASA: II  Anesthesia Plan: General   Post-op Pain Management:    Induction: Intravenous  Airway Management Planned: LMA  Additional Equipment:   Intra-op Plan:   Post-operative Plan: Extubation in OR  Informed Consent: I have reviewed the patients History and Physical, chart, labs and discussed the procedure including the risks, benefits and alternatives for the proposed anesthesia with the patient or authorized representative who has indicated his/her understanding and acceptance.   Dental advisory given  Plan Discussed with: CRNA  Anesthesia Plan Comments:  Anesthesia Quick Evaluation

## 2014-04-09 NOTE — H&P (Signed)
Reason For Visit S/p tube change   Active Problems Problems  1. Multiple sclerosis (340)   Assessed By: Jethro Bolus (Urology); Last Assessed: 18 Mar 2014 2. Urinary incontinence without sensory awareness (788.34)   Assessed By: Jetta Lout (Urology); Last Assessed: 27 Jan 2014  History of Present Illness    63 year old female with multiple sclerosis returns today to discuss getting s/p tube put back in. She was seen in the ER on 02/17/14 for bilateral LE weakness, abdominal pain, & N/V. CT scan showed displaced s/p tube, therefore it was removed. A regular foley was placed. Home health had changed the s/p tube on 02/16/14 as the 1st change, but it was not properly placed into the bladder. S/p cysto/Botox/s/p tube placement on on 01/15/14.    History of chronic Foley catheterization complicated by chronic urinary tract infection.      Foley catheter was removed 1 year ago, and since that time, the patient has complained of urge and urge incontinence. She wears a diaper constantly. The patient denies skin breakdown. She is considering having suprapubic tube placement. She did have cystostomy, and placement of 20 French silicone suprapubic tube in December 2012, but this was complicated by abscess of the suprapubic tube site. At that time, she was irrigating the suprapubic tube site twice weekly with acetic acid. Note her past medical history of anxiety, depression, diabetes, and multiple sclerosis. She is wheelchair-bound. She is being considered for suprapubic tube and Botox bladder injection versus ileal loop.    Urodynamics is accomplished on 11/11/13, in the sitting position. The maximum cystometric capacity is 250 cc. The bladder is unstable, with first contraction occurring at 123 cc. Maximum unstable bladder contraction pressure is 11-21 cm of water with leakage. The patient can feel her bladder being filled.    The patient did not leak for leak point pressure  determination, although cough and Valsalva were extremely weak.    Pressure flow study: The volume voided was approximately 150 cc, with maximum detrusor pressure of 21 cm of water. PVRs approximately 100 cc. EMG shows increased external sphincter dyssynergia.    Study was performed with the patient and her wheelchair, therefore x-ray was not utilized. There was a large amount of artifact during the early part of the study. The patient appears to have a maximum capacity of 200-250 cc. No stress incontinence that is identified. Her cough and Valsalva are exceptionally weak. She does have mild instability. During filling, 3 separate involuntary voiding episodes were recorded. She did leak. Her detrusor leak point pressures ranged from 11-21 cm of water. EMG activity was increased. The patient did not empty well, leaving it up proximally 75-100 cc.   Past Medical History Problems  1. History of Anxiety (300.00) 2. History of Arthritis (V13.4) 3. History of depression (V11.8) 4. History of diabetes mellitus (V12.29) 5. History of esophageal reflux (V12.79) 6. History of hypothyroidism (V12.29) 7. History of Multiple sclerosis (340)  Surgical History Problems  1. History of Ankle Surgery 2. History of Bladder Cystotomy With Drainage 3. History of Bladder Cystotomy With Drainage 4. History of Cystourethroscopy With Irrigation And Evacuation Of Clots 5. History of Tonsillectomy 6. History of Urologic Surgery 7. History of Wrist Surgery  Current Meds 1. Acetaminophen 500 MG Oral Tablet;  Therapy: (Recorded:30Oct2012) to Recorded 2. Amantadine HCl - 100 MG Oral Tablet; prn;  Therapy: (Recorded:12Mar2015) to Recorded 3. Aspirin 81 MG Oral Tablet;  Therapy: (Recorded:30Oct2012) to Recorded 4. Atorvastatin Calcium 20 MG Oral Tablet;  Therapy: 30Oct2012  to Recorded 5. Baclofen TABS;  Therapy: (Recorded:30Oct2012) to Recorded 6. Cranberry TABS;  Therapy: (Recorded:31Jan2013) to  Recorded 7. Cyclobenzaprine HCl - 10 MG Oral Tablet;  Therapy: 21May2015 to Recorded 8. Enablex 7.5 MG Oral Tablet Extended Release 24 Hour;  Therapy: (Recorded:30Oct2012) to Recorded 9. Furosemide 20 MG Oral Tablet;  Therapy: 06Mar2012 to Recorded 10. Gabapentin 400 MG Oral Capsule;   Therapy: 19Jul2013 to Recorded 11. Ibuprofen 800 MG Oral Tablet;   Therapy: (Recorded:30Oct2012) to Recorded 12. Levothyroxine Sodium 125 MCG Oral Tablet;   Therapy: 26Feb2014 to Recorded 13. Meclizine HCl - 25 MG Oral Tablet; prn;   Therapy: 04Jan2013 to Recorded 14. Metamucil CAPS;   Therapy: (Recorded:31Jan2013) to Recorded 15. Multi-Day Vitamins TABS;   Therapy: (Recorded:30Oct2012) to Recorded 16. NexIUM 40 MG Oral Capsule Delayed Release;   Therapy: (Recorded:30Oct2012) to Recorded 17. Nitrofurantoin 50 MG CAPS; Pt takes 50 mg q day for supression.  Rx prescribed by Dr.   Redmond Schoolripp;   Therapy: (Recorded:30Apr2015) to Recorded 18. Sertraline HCl - 25 MG Oral Tablet;   Therapy: 23Oct2012 to Recorded  Allergies Medication  1. Avelox TABS 2. Cipro TABS 3. Penicillins 4. Trimethoprim TABS  Family History Problems  1. Family history of Death In The Family Mother : Father   62 yo COPD 2. Family history of Family Health Status - Father's Age : Father 3. Family history of Hypertension (V17.49) : Father 4. Family history of Type 2 Diabetes Mellitus : Father 5. Family history of Urinary Calculus : Father  Social History Problems  1. Denied: History of Alcohol Use 2. Denied: History of Caffeine Use 3. Marital History - Currently Married 4. Never A Smoker 5. Physical Disability Affecting Ability To Work  Review of Systems Genitourinary, constitutional, skin, eye, otolaryngeal, hematologic/lymphatic, cardiovascular, pulmonary, endocrine, musculoskeletal, gastrointestinal, neurological and psychiatric system(s) were reviewed and pertinent findings if present are noted.  Genitourinary:  incontinence.    Vitals Vital Signs [Data Includes: Last 1 Day]  Recorded: 17Sep2015 10:54AM  Blood Pressure: 136 / 85 Temperature: 97.6 F Heart Rate: 102  Procedure 18 fr foley replaced today without difficulty. Foley irrigated with return & was hooked to an overnight bag.     Assessment Assessed  1. Catheter-associated urinary tract infection (996.64,599.0) 2. Multiple sclerosis (340) 3. Urinary incontinence with continuous leakage (788.37)  63 yo female now 1 month post ER evaluation of leaking S-P tube following replacement hy Home Health, with finding of s-p tube outside of the bladder.She is post cysto, s-p tube and Botox injection, and is doing well from Botox, with resolution of her incontnence, but necessity of a foley catheter. We have discussed problems with long term foley catheters, including urethral erosion, and we both agree that she should have another s-p tube placed. She will have her S-P tube changed by her NP, Marletta LorJulie Barr.   Plan Acute cystitis without hematuria  1. BUN & CREATININE; Status:Canceled - Specimen/Data Collection;  Urge and stress incontinence  2. Follow-up Office  Follow-up  Status: Canceled - Date of Service Urinary incontinence without sensory awareness  3. Cath, simple, wIinsert Temp Cath; Status:Complete;   Done: 17Sep2015  Schedule S-p tube.   Signatures Electronically signed by : Jethro BolusSigmund Samarion Ehle, M.D.; Mar 18 2014 11:53AM EST

## 2014-04-09 NOTE — Op Note (Signed)
Pre-operative diagnosis :   Multiple sclerosis, post Botox and suprapubic tube placement, with malposition of suprapubic tube replacement.  Postoperative diagnosis:  Same  Operation:  Cystourethroscopy, replacement of super pubic catheter  Surgeon:  S. Patsi Searsannenbaum, MD  First assistant:  None  Anesthesia:  general  Preparation:  After appropriate preanesthesia, the patient was brought the operating room, placed on the operating table in the dorsal supine position where general LMA anesthesia was introduced. She was replaced in the dorsal lithotomy position with pubis was prepped with Betadine solution and draped in usual fashion. A  Review history:  63 year old female with multiple sclerosis returns today to discuss getting s/p tube put back in. She was seen in the ER on 02/17/14 for bilateral LE weakness, abdominal pain, & N/V. CT scan showed displaced s/p tube, therefore it was removed. A regular foley was placed. Home health had changed the s/p tube on 02/16/14 as the 1st change, but it was not properly placed into the bladder. S/p cysto/Botox/s/p tube placement on on 01/15/14.      Statement of  Likelihood of Success: Excellent. TIME-OUT observed.:  Procedure:  Cystourethroscopy was accomplished with the 12 lens, and showed normal appearing urethra, a normal-appearing bladder base. Abdominal inspection revealed a morbidly obese abdomen, with previous subcutaneous site identified just the left of the midline. The bladder was filled with fluid as much as possible. The patient is status post Botox injection bladder, but still has uninhibited bladder contractions with spontaneous bladder drainage.  The Lowsley tractor was placed, and the bladder is identified. Cutdown is accomplished right over the Lowsley tractor, so that it is delivered into the wound. An 18-gauge Silastic catheter is then passed antegrade, through the bladder, and through the urethra. Using a 70 lens, cystourethroscopy is  accomplished to follow the catheter back in the bladder. 10 cc of sterile water placed in the balloon, and the balloon was snugged up against the bladder wall. Remaining bladder appears normal. The bladder is drained of fluid.   3-0 Vicryl to suture the suprapubic insertion site with a single suture in the subcutaneous tissue, and a single suture in the skin. 10 cc of Xylocaine 1% with epinephrine 1 200,000 is injected into the superior site.  The patient tolerated the procedure well, and was awakened, and taken to recovery room in good condition.

## 2014-04-09 NOTE — Discharge Instructions (Signed)
CYSTOSCOPY HOME CARE INSTRUCTIONS ° °Activity: °Rest for the remainder of the day.  Do not drive or operate equipment today.  You may resume normal activities in one to two days as instructed by your physician.  ° °Meals: °Drink plenty of liquids and eat light foods such as gelatin or soup this evening.  You may return to a normal meal plan tomorrow. ° °Return to Work: °You may return to work in one to two days or as instructed by your physician. ° °Special Instructions / Symptoms: °Call your physician if any of these symptoms occur: ° ° -persistent or heavy bleeding ° -bleeding which continues after first few urination ° -large blood clots that are difficult to pass ° -urine stream diminishes or stops completely ° -fever equal to or higher than 101 degrees Farenheit. ° -cloudy urine with a strong, foul odor ° -severe pain ° °Females should always wipe from front to back after elimination.  You may feel some burning pain when you urinate.  This should disappear with time.  Applying moist heat to the lower abdomen or a hot tub bath may help relieve the pain. \ ° °Follow-Up / Date of Return Visit to Your Physician:   °Call for an appointment to arrange follow-up. ° °Patient Signature:  ________________________________________________________ ° °Nurse's Signature:  ________________________________________________________ ° °

## 2014-04-09 NOTE — Interval H&P Note (Signed)
History and Physical Interval Note:  04/09/2014 8:54 AM  [name]  has presented today for surgery, with the diagnosis of URINARY RETENTION   The various methods of treatment have been discussed with the patient and family. After consideration of risks, benefits and other options for treatment, the patient has consented to  Procedure(s): SUPRAPUBIC TUBE PLACEMENT  (N/A) as a surgical intervention .  The patient's history has been reviewed, patient examined, no change in status, stable for surgery.  I have reviewed the patient's chart and labs.  Questions were answered to the patient's satisfaction.     Jethro BolusANNENBAUM, Tayshawn Purnell I

## 2014-04-09 NOTE — Progress Notes (Signed)
Patient back from PACU and SP dressing saturated with dark red blood 3/4 of dressing. Few blood clots coming out bottom of dressing. Urine yellow draining into drainage bag. RN marked drainage and no increase in 30 minutes. VSS. Pain 4/10. Called Dr. Patsi Sears and informed him of above. He stated to remove dressing and place new one. If no increase in bleeding patient may be d/c home.

## 2014-04-09 NOTE — Transfer of Care (Signed)
Immediate Anesthesia Transfer of Care Note  Patient: [name]  Procedure(s) Performed: Procedure(s): SUPRAPUBIC TUBE PLACEMENT  (N/A)  Patient Location: PACU  Anesthesia Type:General  Level of Consciousness: sedated  Airway & Oxygen Therapy: Patient Spontanous Breathing and Patient connected to face mask oxygen  Post-op Assessment: Report given to PACU RN and Post -op Vital signs reviewed and stable  Post vital signs: Reviewed and stable  Complications: No apparent anesthesia complications

## 2014-04-09 NOTE — Anesthesia Postprocedure Evaluation (Signed)
Anesthesia Post Note  Patient: [name]  Procedure(s) Performed: Procedure(s) (LRB): SUPRAPUBIC TUBE PLACEMENT  (N/A)  Anesthesia type: General  Patient location: PACU  Post pain: Pain level controlled  Post assessment: Post-op Vital signs reviewed  Last Vitals: BP 135/76  Pulse 90  Temp(Src) 36.3 C (Oral)  Resp 15  Ht 5\' 2"  (1.575 m)  Wt 151 lb (68.493 kg)  BMI 27.61 kg/m2  SpO2 100%  Post vital signs: Reviewed  Level of consciousness: sedated  Complications: No apparent anesthesia complications

## 2014-04-12 ENCOUNTER — Encounter (HOSPITAL_COMMUNITY): Payer: Self-pay | Admitting: Urology

## 2014-07-29 ENCOUNTER — Encounter (HOSPITAL_BASED_OUTPATIENT_CLINIC_OR_DEPARTMENT_OTHER): Payer: Self-pay

## 2014-07-29 ENCOUNTER — Emergency Department (HOSPITAL_BASED_OUTPATIENT_CLINIC_OR_DEPARTMENT_OTHER)
Admission: EM | Admit: 2014-07-29 | Discharge: 2014-07-29 | Disposition: A | Discharge status: Home / Self Care | Payer: Medicare Other | Attending: Emergency Medicine | Admitting: Emergency Medicine

## 2014-07-29 DIAGNOSIS — G709 Myoneural disorder, unspecified: Secondary | ICD-10-CM | POA: Diagnosis not present

## 2014-07-29 DIAGNOSIS — T839XXA Unspecified complication of genitourinary prosthetic device, implant and graft, initial encounter: Secondary | ICD-10-CM

## 2014-07-29 DIAGNOSIS — Z7982 Long term (current) use of aspirin: Secondary | ICD-10-CM | POA: Insufficient documentation

## 2014-07-29 DIAGNOSIS — H5441 Blindness, right eye, normal vision left eye: Secondary | ICD-10-CM | POA: Diagnosis not present

## 2014-07-29 DIAGNOSIS — M549 Dorsalgia, unspecified: Secondary | ICD-10-CM | POA: Insufficient documentation

## 2014-07-29 DIAGNOSIS — Z880 Allergy status to penicillin: Secondary | ICD-10-CM | POA: Diagnosis not present

## 2014-07-29 DIAGNOSIS — G35 Multiple sclerosis: Secondary | ICD-10-CM | POA: Diagnosis not present

## 2014-07-29 DIAGNOSIS — E780 Pure hypercholesterolemia: Secondary | ICD-10-CM | POA: Diagnosis not present

## 2014-07-29 DIAGNOSIS — Z87440 Personal history of urinary (tract) infections: Secondary | ICD-10-CM | POA: Diagnosis not present

## 2014-07-29 DIAGNOSIS — T83098A Other mechanical complication of other indwelling urethral catheter, initial encounter: Secondary | ICD-10-CM | POA: Diagnosis not present

## 2014-07-29 DIAGNOSIS — M1990 Unspecified osteoarthritis, unspecified site: Secondary | ICD-10-CM | POA: Insufficient documentation

## 2014-07-29 DIAGNOSIS — Z79899 Other long term (current) drug therapy: Secondary | ICD-10-CM | POA: Insufficient documentation

## 2014-07-29 DIAGNOSIS — F419 Anxiety disorder, unspecified: Secondary | ICD-10-CM | POA: Insufficient documentation

## 2014-07-29 DIAGNOSIS — E119 Type 2 diabetes mellitus without complications: Secondary | ICD-10-CM | POA: Insufficient documentation

## 2014-07-29 DIAGNOSIS — K219 Gastro-esophageal reflux disease without esophagitis: Secondary | ICD-10-CM | POA: Insufficient documentation

## 2014-07-29 DIAGNOSIS — E039 Hypothyroidism, unspecified: Secondary | ICD-10-CM | POA: Insufficient documentation

## 2014-07-29 DIAGNOSIS — R32 Unspecified urinary incontinence: Secondary | ICD-10-CM | POA: Diagnosis present

## 2014-07-29 LAB — URINE MICROSCOPIC-ADD ON

## 2014-07-29 LAB — URINALYSIS, ROUTINE W REFLEX MICROSCOPIC
Bilirubin Urine: NEGATIVE
Glucose, UA: NEGATIVE mg/dL
KETONES UR: NEGATIVE mg/dL
NITRITE: NEGATIVE
Protein, ur: NEGATIVE mg/dL
Specific Gravity, Urine: 1.015 (ref 1.005–1.030)
UROBILINOGEN UA: 1 mg/dL (ref 0.0–1.0)
pH: 7 (ref 5.0–8.0)

## 2014-07-29 NOTE — ED Notes (Signed)
Here for suprapubic reinsertion.  Per EMS and pt, home health nurse attempted x 3 to change suprapubic cath without success.

## 2014-07-29 NOTE — ED Provider Notes (Signed)
CSN: [id number]     Arrival date & time 07/29/14  1740 History   First MD Initiated Contact with Patient 07/29/14 1753     Chief Complaint  Patient presents with  . Urinary Incontinence    The history is provided by the patient.  Foley catheter complications  Patient reports her suprapubic catheter is dislodged.  She reports that she has chronic SP foley due to h/o multiple sclerosis.  She reports that she gets it changed monthly - today it was to be changed but after 3 failed attempts she was sent for evaluation . The foley is still in place but there is no urine flow and she is urinating around the site.  No fever/vomiting.  She reports mild back pain.  No abdominal pain. Her course is worsening.  Nothing improves her symptoms.    Past Medical History  Diagnosis Date  . Urinary incontinence     neurogenic bladder--hx of uti's  . Thyroid disease   . Multiple sclerosis     pt is bedridden -does get up to recliner by hoyer lift; mentally alert, able to feed herself -no trouble swallowing. lives at home with husband and has 24 hour caregivers.  . Hypothyroidism   . Anxiety   . Blindness of right eye     complication of ms  . Cellulitis     hx of cellulitis in lower extremities -requiring multiple hospitalzations in the past--no problems in las 2 yrs  . Bedridden 09/02/2013  . High cholesterol   . UTI (lower urinary tract infection)   . Type II diabetes mellitus     diet control - no longer takes any diabetic meds  . GERD (gastroesophageal reflux disease)     nexium takes care  . Arthritis     old falls-with injuries to rt knee and both ankles and rt wrist--states she has some pain in these areas-probable arthritis  . Arthritis     "hands" (09/03/2013)  . Neuromuscular disorder     ms-not ambulatory-muscle spasms-and recent tremors left arm and left  leg have developed--dr. Daphane Shepherd in [place] is pt's neurologist   Past Surgical History  Procedure Laterality Date  .  Cholecystectomy    . Tonsillectomy    . Orif ankle fracture Left 2005  . Wrist surgery Right   . Lymph gland excision    . Mediastinoscopy  04/24/10    for enlarged right paratracheal lymph node--surgery at [facility]-Dr. Burney--pt states she does not know the  what the pathology report showed.  . Adenoidectomy    . Fracture surgery    . Cystoscopy  07/02/2011    Procedure: CYSTOSCOPY;  Surgeon: Kathi Ludwig, MD;  Location: [facility];  Service: Urology;  Laterality: N/A;  . Insertion of suprapubic catheter N/A 01/15/2014    Procedure: CYSTOSCOPY WITH BOTOX INJECTION/SUPRAPUBIC TUBE PLACEMENT;  Surgeon: Kathi Ludwig, MD;  Location: [facility];  Service: Urology;  Laterality: N/A;  . Insertion of suprapubic catheter N/A 04/09/2014    Procedure: SUPRAPUBIC TUBE PLACEMENT ;  Surgeon: Kathi Ludwig, MD;  Location: [facility];  Service: Urology;  Laterality: N/A;   No family history on file. History  Substance Use Topics  . Smoking status: Never Smoker   . Smokeless tobacco: Never Used  . Alcohol Use: No   OB History    No data available     Review of Systems  Constitutional: Negative for fever.  Gastrointestinal: Negative for abdominal pain.  Genitourinary: Positive for difficulty urinating.  Musculoskeletal: Positive for back pain.  All other systems reviewed and are negative.     Allergies  Avelox; Ciprofloxacin hcl; Penicillins; and Shrimp  Home Medications   Prior to Admission medications   Medication Sig Start Date End Date Taking? Authorizing Provider  aspirin EC 81 MG tablet Take 81 mg by mouth daily.    Historical Provider, MD  atorvastatin (LIPITOR) 20 MG tablet Take 20 mg by mouth at bedtime.    Historical Provider, MD  CRANBERRY PO Take 1 tablet by mouth daily as needed (for urinary pain).     Historical Provider, MD  cyclobenzaprine (FLEXERIL) 5 MG tablet Take 5-10 mg by mouth See admin instructions. Pt takes 5 mg three times daily and then 10 mg nightly at  bedtime.    Historical Provider, MD  darifenacin (ENABLEX) 7.5 MG 24 hr tablet Take 7.5 mg by mouth 2 (two) times daily.    Historical Provider, MD  diphenhydrAMINE (BENADRYL) 25 MG tablet Take 50 mg by mouth every 6 (six) hours as needed for itching.    Historical Provider, MD  docusate sodium (COLACE) 100 MG capsule Take 100 mg by mouth daily as needed for mild constipation.    Historical Provider, MD  esomeprazole (NEXIUM) 40 MG capsule Take 40 mg by mouth every morning.     Historical Provider, MD  furosemide (LASIX) 20 MG tablet Take 20 mg by mouth 2 (two) times daily.    Historical Provider, MD  gabapentin (NEURONTIN) 400 MG capsule Take 800 mg by mouth 4 (four) times daily.     Historical Provider, MD  levothyroxine (SYNTHROID) 125 MCG tablet Take 1 tablet (125 mcg total) by mouth daily before breakfast. 05/11/13   Jeralyn Bennett, MD  liver oil-zinc oxide (DESITIN) 40 % ointment Apply 1 application topically as needed for irritation.    Historical Provider, MD  meclizine (ANTIVERT) 25 MG tablet Take 25 mg by mouth 3 (three) times daily as needed for dizziness.    Historical Provider, MD  mineral oil-hydrophilic petrolatum (AQUAPHOR) ointment Apply 1 application topically as needed for dry skin.    Historical Provider, MD  Multiple Vitamin (MULTIVITAMIN WITH MINERALS) TABS tablet Take 1 tablet by mouth daily.    Historical Provider, MD  nystatin (MYCOSTATIN/NYSTOP) 100000 UNIT/GM POWD Apply 1 Bottle topically 2 (two) times daily. To perineal/groin area and right great toe as needed for yeast    Historical Provider, MD  nystatin cream (MYCOSTATIN) Apply 1 application topically as needed for dry skin.    Historical Provider, MD  simethicone (GAS-X EXTRA STRENGTH) 125 MG chewable tablet Chew 125 mg by mouth every 6 (six) hours as needed for flatulence.    Historical Provider, MD  traMADol-acetaminophen (ULTRACET) 37.5-325 MG per tablet Take 1 tablet by mouth every 6 (six) hours as needed. 04/09/14    Kathi Ludwig, MD  trolamine salicylate (ASPERCREME) 10 % cream Apply 1 application topically as needed for muscle pain.    Historical Provider, MD   BP 160/109 mmHg  Pulse 105  Temp(Src) 97.6 F (36.4 C) (Oral)  Resp 18  Ht  (1.575 m)  Wt 156 lb (70.761 kg)  BMI 28.53 kg/m2  SpO2 100% Physical Exam CONSTITUTIONAL: chronically ill appearing HEAD: Normocephalic/atraumatic EYES: EOMI ENMT: Mucous membranes moist NECK: supple no meningeal signs CV: S1/S2 noted, no murmurs/rubs/gallops noted LUNGS: Lungs are clear to auscultation bilaterally, no apparent distress ABDOMEN: soft, obese, nontender, no rebound or guarding.  SP catheter noted with small amt of blood and  discharge noted around site NEURO: Pt is awake/alert/appropriate SKIN: warm, color normal PSYCH: no abnormalities of mood noted, alert and oriented to situation  ED Course  Procedures  6:18 PM Will consult urology Pt stable at this time 6:59 PM D/w dr Emelda Fear with urology He reviewed chart He recommends pulling SP catheter He recommends urethral catheter and f/u with dr Patsi Sears next week Pt agreeable with plan 7:31 PM uretheral Foley placed by nursing No complications  Advised to f/u with urology soon Urine culture sent  Labs Review Labs Reviewed  URINE CULTURE  URINALYSIS, ROUTINE W REFLEX MICROSCOPIC     MDM   Final diagnoses:  Complication of Foley catheter, initial encounter    Nursing notes including past medical history and social history reviewed and considered in documentation Previous records reviewed and considered     Joya Gaskins, MD 07/29/14 1932

## 2014-07-29 NOTE — ED Notes (Signed)
MD at bedside. 

## 2014-07-30 LAB — URINE CULTURE: Colony Count: >=100000

## 2014-08-12 ENCOUNTER — Other Ambulatory Visit: Payer: Self-pay | Admitting: Urology

## 2014-08-12 ENCOUNTER — Encounter (HOSPITAL_COMMUNITY): Payer: Self-pay | Admitting: *Deleted

## 2014-08-16 ENCOUNTER — Encounter (HOSPITAL_COMMUNITY): Payer: Self-pay | Admitting: *Deleted

## 2014-08-18 MED ORDER — GENTAMICIN SULFATE 40 MG/ML IJ SOLN
5.0000 mg/kg | INTRAVENOUS | Status: AC
  Administered 2014-08-19: 290 mg via INTRAVENOUS
  Filled 2014-08-18: qty 7.25

## 2014-08-19 ENCOUNTER — Encounter (HOSPITAL_COMMUNITY)
Admission: RE | Disposition: A | Discharge status: Home / Self Care | Payer: Self-pay | Source: Ambulatory Visit | Attending: Urology

## 2014-08-19 ENCOUNTER — Encounter (HOSPITAL_COMMUNITY): Payer: Self-pay | Admitting: *Deleted

## 2014-08-19 ENCOUNTER — Ambulatory Visit (HOSPITAL_COMMUNITY): Payer: Medicare Other | Admitting: Anesthesiology

## 2014-08-19 ENCOUNTER — Ambulatory Visit (HOSPITAL_COMMUNITY)
Admission: RE | Admit: 2014-08-19 | Discharge: 2014-08-19 | Disposition: A | Discharge status: Home / Self Care | Payer: Medicare Other | Source: Ambulatory Visit | Attending: Urology | Admitting: Urology

## 2014-08-19 DIAGNOSIS — Z888 Allergy status to other drugs, medicaments and biological substances status: Secondary | ICD-10-CM | POA: Diagnosis not present

## 2014-08-19 DIAGNOSIS — F419 Anxiety disorder, unspecified: Secondary | ICD-10-CM | POA: Diagnosis not present

## 2014-08-19 DIAGNOSIS — E119 Type 2 diabetes mellitus without complications: Secondary | ICD-10-CM | POA: Diagnosis not present

## 2014-08-19 DIAGNOSIS — Z87440 Personal history of urinary (tract) infections: Secondary | ICD-10-CM | POA: Diagnosis not present

## 2014-08-19 DIAGNOSIS — Z7982 Long term (current) use of aspirin: Secondary | ICD-10-CM | POA: Insufficient documentation

## 2014-08-19 DIAGNOSIS — Z993 Dependence on wheelchair: Secondary | ICD-10-CM | POA: Diagnosis not present

## 2014-08-19 DIAGNOSIS — F329 Major depressive disorder, single episode, unspecified: Secondary | ICD-10-CM | POA: Diagnosis not present

## 2014-08-19 DIAGNOSIS — Z880 Allergy status to penicillin: Secondary | ICD-10-CM | POA: Insufficient documentation

## 2014-08-19 DIAGNOSIS — N318 Other neuromuscular dysfunction of bladder: Secondary | ICD-10-CM | POA: Diagnosis not present

## 2014-08-19 DIAGNOSIS — Z466 Encounter for fitting and adjustment of urinary device: Secondary | ICD-10-CM | POA: Diagnosis not present

## 2014-08-19 DIAGNOSIS — G35 Multiple sclerosis: Secondary | ICD-10-CM | POA: Diagnosis not present

## 2014-08-19 DIAGNOSIS — Z883 Allergy status to other anti-infective agents status: Secondary | ICD-10-CM | POA: Insufficient documentation

## 2014-08-19 DIAGNOSIS — K219 Gastro-esophageal reflux disease without esophagitis: Secondary | ICD-10-CM | POA: Insufficient documentation

## 2014-08-19 DIAGNOSIS — E039 Hypothyroidism, unspecified: Secondary | ICD-10-CM | POA: Insufficient documentation

## 2014-08-19 DIAGNOSIS — R32 Unspecified urinary incontinence: Secondary | ICD-10-CM | POA: Diagnosis not present

## 2014-08-19 HISTORY — PX: INSERTION OF SUPRAPUBIC CATHETER: SHX5870

## 2014-08-19 LAB — BASIC METABOLIC PANEL
Anion gap: 8 (ref 5–15)
BUN: 21 mg/dL (ref 6–23)
CHLORIDE: 102 mmol/L (ref 96–112)
CO2: 29 mmol/L (ref 19–32)
CREATININE: 0.71 mg/dL (ref 0.50–1.10)
Calcium: 9.1 mg/dL (ref 8.4–10.5)
GFR calc Af Amer: >90 mL/min (ref >90)
GFR calc non Af Amer: >90 mL/min (ref >90)
Glucose, Bld: 109 mg/dL — ABNORMAL HIGH (ref 70–99)
Potassium: 4.1 mmol/L (ref 3.5–5.1)
SODIUM: 139 mmol/L (ref 135–145)

## 2014-08-19 LAB — CBC
HEMATOCRIT: 44.5 % (ref 36.0–46.0)
Hemoglobin: 14.6 g/dL (ref 12.0–15.0)
MCH: 29.9 pg (ref 26.0–34.0)
MCHC: 32.8 g/dL (ref 30.0–36.0)
MCV: 91 fL (ref 78.0–100.0)
Platelets: 298 10*3/uL (ref 150–400)
RBC: 4.89 MIL/uL (ref 3.87–5.11)
RDW: 13.7 % (ref 11.5–15.5)
WBC: 10.9 10*3/uL — ABNORMAL HIGH (ref 4.0–10.5)

## 2014-08-19 LAB — GLUCOSE, CAPILLARY
GLUCOSE-CAPILLARY: 117 mg/dL — AB (ref 70–99)
Glucose-Capillary: 105 mg/dL — ABNORMAL HIGH (ref 70–99)

## 2014-08-19 SURGERY — INSERTION, SUPRAPUBIC CATHETER
Anesthesia: General | Site: Abdomen

## 2014-08-19 MED ORDER — BUPIVACAINE-EPINEPHRINE (PF) 0.5% -1:200000 IJ SOLN
INTRAMUSCULAR | Status: AC
  Filled 2014-08-19: qty 30

## 2014-08-19 MED ORDER — OXYCODONE-ACETAMINOPHEN 5-325 MG PO TABS: 1.0000 | ORAL_TABLET | ORAL | Status: DC | PRN

## 2014-08-19 MED ORDER — FENTANYL CITRATE 0.05 MG/ML IJ SOLN: 25.0000 ug | INTRAMUSCULAR | Status: DC | PRN

## 2014-08-19 MED ORDER — ONDANSETRON HCL 4 MG/2ML IJ SOLN
INTRAMUSCULAR | Status: AC
  Filled 2014-08-19: qty 2

## 2014-08-19 MED ORDER — DEXAMETHASONE SODIUM PHOSPHATE 10 MG/ML IJ SOLN
INTRAMUSCULAR | Status: AC
  Filled 2014-08-19: qty 1

## 2014-08-19 MED ORDER — STERILE WATER FOR IRRIGATION IR SOLN
Status: DC | PRN
  Administered 2014-08-19: 3000 mL

## 2014-08-19 MED ORDER — HEMOSTATIC AGENTS (NO CHARGE) OPTIME
TOPICAL | Status: DC | PRN
  Administered 2014-08-19: 1 via TOPICAL

## 2014-08-19 MED ORDER — BUPIVACAINE-EPINEPHRINE 0.5% -1:200000 IJ SOLN
INTRAMUSCULAR | Status: DC | PRN
  Administered 2014-08-19: 10 mL

## 2014-08-19 MED ORDER — BELLADONNA ALKALOIDS-OPIUM 16.2-60 MG RE SUPP
RECTAL | Status: AC
  Filled 2014-08-19: qty 1

## 2014-08-19 MED ORDER — ONDANSETRON HCL 4 MG/2ML IJ SOLN
INTRAMUSCULAR | Status: DC | PRN
  Administered 2014-08-19: 4 mg via INTRAVENOUS

## 2014-08-19 MED ORDER — PROPOFOL 10 MG/ML IV BOLUS
INTRAVENOUS | Status: DC | PRN
  Administered 2014-08-19: 120 mg via INTRAVENOUS

## 2014-08-19 MED ORDER — 0.9 % SODIUM CHLORIDE (POUR BTL) OPTIME
TOPICAL | Status: DC | PRN
  Administered 2014-08-19: 1000 mL

## 2014-08-19 MED ORDER — FENTANYL CITRATE 0.05 MG/ML IJ SOLN
INTRAMUSCULAR | Status: DC | PRN
  Administered 2014-08-19 (×4): 25 ug via INTRAVENOUS

## 2014-08-19 MED ORDER — PROMETHAZINE HCL 25 MG/ML IJ SOLN: 6.2500 mg | INTRAMUSCULAR | Status: DC | PRN

## 2014-08-19 MED ORDER — DEXAMETHASONE SODIUM PHOSPHATE 10 MG/ML IJ SOLN
INTRAMUSCULAR | Status: DC | PRN
  Administered 2014-08-19: 10 mg via INTRAVENOUS

## 2014-08-19 MED ORDER — OXYCODONE-ACETAMINOPHEN 5-325 MG PO TABS
1.0000 | ORAL_TABLET | Freq: Once | ORAL | Status: AC
  Administered 2014-08-19: 1 via ORAL
  Filled 2014-08-19: qty 1

## 2014-08-19 MED ORDER — PROPOFOL 10 MG/ML IV BOLUS
INTRAVENOUS | Status: AC
  Filled 2014-08-19: qty 20

## 2014-08-19 MED ORDER — LACTATED RINGERS IV SOLN
INTRAVENOUS | Status: DC
  Administered 2014-08-19: 1000 mL via INTRAVENOUS

## 2014-08-19 MED ORDER — FENTANYL CITRATE 0.05 MG/ML IJ SOLN
INTRAMUSCULAR | Status: AC
Start: 2014-08-19 — End: 2014-08-19
  Filled 2014-08-19: qty 5

## 2014-08-19 MED ORDER — BUPIVACAINE HCL (PF) 0.5 % IJ SOLN
INTRAMUSCULAR | Status: AC
  Filled 2014-08-19: qty 30

## 2014-08-19 SURGICAL SUPPLY — 33 items
BAG URINE DRAINAGE (UROLOGICAL SUPPLIES) ×3 IMPLANT
BAG URINE LEG 500ML (DRAIN) ×3 IMPLANT
BLADE SURG 15 STRL LF DISP TIS (BLADE) ×1 IMPLANT
BLADE SURG 15 STRL SS (BLADE) ×3
CATH SILASTIC FOLEY 18FRX5CC (CATHETERS) ×2 IMPLANT
DRAPE LAPAROSCOPIC ABDOMINAL (DRAPES) ×2 IMPLANT
ELECT PENCIL ROCKER SW 15FT (MISCELLANEOUS) ×2 IMPLANT
ELECT REM PT RETURN 9FT ADLT (ELECTROSURGICAL) ×3
ELECTRODE REM PT RTRN 9FT ADLT (ELECTROSURGICAL) ×1 IMPLANT
FLOSEAL 10ML (HEMOSTASIS) ×2 IMPLANT
GAUZE SPONGE 4X4 12PLY STRL (GAUZE/BANDAGES/DRESSINGS) ×2 IMPLANT
GLOVE SURG SS PI 8.0 STRL IVOR (GLOVE) ×3 IMPLANT
GOWN STRL REUS W/TWL XL LVL3 (GOWN DISPOSABLE) ×6 IMPLANT
HEMOSTAT SURGICEL 4X8 (HEMOSTASIS) ×2 IMPLANT
KIT SUPRAPUBIC CATH (MISCELLANEOUS) ×3 IMPLANT
MANIFOLD NEPTUNE II (INSTRUMENTS) ×3 IMPLANT
NEEDLE HYPO 22GX1.5 SAFETY (NEEDLE) IMPLANT
NS IRRIG 1000ML POUR BTL (IV SOLUTION) ×3 IMPLANT
PACK CYSTO (CUSTOM PROCEDURE TRAY) ×3 IMPLANT
PENCIL BUTTON HOLSTER BLD 10FT (ELECTRODE) IMPLANT
PLUG CATH AND CAP STER (CATHETERS) IMPLANT
SUT ETHILON 2 0 PS N (SUTURE) ×2 IMPLANT
SUT ETHILON 2 0 PSLX (SUTURE) ×4 IMPLANT
SUT ETHILON 3 0 PS 1 (SUTURE) ×3 IMPLANT
SUT SILK 2 0 30  PSL (SUTURE) ×2
SUT SILK 2 0 30 PSL (SUTURE) IMPLANT
SUT VIC AB 2-0 UR5 27 (SUTURE) ×4 IMPLANT
SUT VIC AB 2-0 UR6 27 (SUTURE) ×6 IMPLANT
SYR BULB IRRIGATION 50ML (SYRINGE) ×2 IMPLANT
TAPE CLOTH SURG 4X10 WHT LF (GAUZE/BANDAGES/DRESSINGS) ×2 IMPLANT
TOWEL OR 17X26 10 PK STRL BLUE (TOWEL DISPOSABLE) ×3 IMPLANT
WATER STERILE IRR 3000ML UROMA (IV SOLUTION) ×3 IMPLANT
YANKAUER SUCT BULB TIP NO VENT (SUCTIONS) ×2 IMPLANT

## 2014-08-19 NOTE — Op Note (Signed)
Pre-operative diagnosis :  Wound bleeding  Postoperative diagnosis:  same  Operation: wound exploration and wound re- closure  Surgeon:  S. Patsi Sears, MD  First assistant:  Julius Bowels, MD  Anesthesia:   Gen LMA}  Preparation: with th ept still under general LMA anesthesia,  her wound continued to bleed through the dressing. Therefore, I was recalled to the operating room, and reevaluation of the wound showed that despite multiple deep skin sutures, that the wound continued to bleed. Therefore, the wound was reprepped with an occipital solution, re-draped in the usual fashion. 10 mL of Marcaine 0.25 with epinephrine 1-250,000 was injected into the wound edges. The previous lead placed sutures both superficial and deep were removed.  Review history:  The patient is a 64 year old morbidly obese female with multiple cirrhosis, post superpubic catheter replacement this morning. The patient has  been noted to have bleeding from the operative site.  Statement of  Likelihood of Success: Excellent. TIME-OUT observed.:  Procedure:  Operative wound expiration is accomplished, and no definitive bleeding site is identified. However, to sutures are placed of 2-0 Vicryl through the fascia around the suprapubic catheter. The sutures are ligated, and the bleeding appeared to stop. The wound was irrigated, and packed for 3 minutes. Repeat observation revealed no bleeding. Therefore, the wound was closed using 2-0 Vicryl interrupted sutures, and 3-0 nylon skin sutures. Sterile dressing was applied, and the patient was awakened, and taken to recovery room in good condition.

## 2014-08-19 NOTE — Anesthesia Preprocedure Evaluation (Signed)
Anesthesia Evaluation  Patient identified by MRN, date of birth, ID band Patient awake    Reviewed: Allergy & Precautions, H&P , NPO status , Patient's Chart, lab work & pertinent test results  Airway Mallampati: II  TM Distance: >3 FB Neck ROM: Full    Dental  (+) Dental Advisory Given   Pulmonary neg pulmonary ROS,  breath sounds clear to auscultation        Cardiovascular negative cardio ROS  Rhythm:Regular Rate:Normal     Neuro/Psych PSYCHIATRIC DISORDERS Anxiety MS  Neuromuscular disease    GI/Hepatic Neg liver ROS, GERD-  Medicated,  Endo/Other  diabetes, Type 2Hypothyroidism   Renal/GU negative Renal ROS     Musculoskeletal negative musculoskeletal ROS (+)   Abdominal (+) + obese,   Peds  Hematology negative hematology ROS (+)   Anesthesia Other Findings   Reproductive/Obstetrics negative OB ROS                             Anesthesia Physical  Anesthesia Plan  ASA: III  Anesthesia Plan: General   Post-op Pain Management:    Induction: Intravenous  Airway Management Planned: LMA  Additional Equipment:   Intra-op Plan:   Post-operative Plan: Extubation in OR  Informed Consent: I have reviewed the patients History and Physical, chart, labs and discussed the procedure including the risks, benefits and alternatives for the proposed anesthesia with the patient or authorized representative who has indicated his/her understanding and acceptance.   Dental advisory given  Plan Discussed with: CRNA  Anesthesia Plan Comments:         Anesthesia Quick Evaluation

## 2014-08-19 NOTE — Anesthesia Postprocedure Evaluation (Signed)
  Anesthesia Post-op Note  Patient: [name]  Procedure(s) Performed: Procedure(s): PLACEMENT OF SUPRAPUBIC TUBE ,cystoscopy, control of bleeding (N/A)  Patient Location: PACU  Anesthesia Type:General  Level of Consciousness: awake and alert   Airway and Oxygen Therapy: Patient Spontanous Breathing  Post-op Pain: none  Post-op Assessment: Post-op Vital signs reviewed  Post-op Vital Signs: Reviewed  Last Vitals:  Filed Vitals:   08/19/14 1300  BP: 127/63  Pulse: 99  Temp:   Resp: 16    Complications: No apparent anesthesia complications

## 2014-08-19 NOTE — Interval H&P Note (Signed)
History and Physical Interval Note:  08/19/2014 9:14 AM  [name]  has presented today for surgery, with the diagnosis of urinary retention   The various methods of treatment have been discussed with the patient and family. After consideration of risks, benefits and other options for treatment, the patient has consented to  Procedure(s): PLACEMENT OF SUPRAPUBIC TUBE  (N/A) as a surgical intervention .  The patient's history has been reviewed, patient examined, no change in status, stable for surgery.  I have reviewed the patient's chart and labs.  Questions were answered to the patient's satisfaction.     Jethro Bolus I

## 2014-08-19 NOTE — Op Note (Signed)
Pre-operative diagnosis :   Multiple sclerosis with hypertonic bladder post Botox injection and suprapubic tube placement, now for SP tube replacement  Postoperative diagnosis:  Same Operation:  Cystourethroscopy, placement of suprapubic catheter  Surgeon:  S. Patsi Sears, MD  First assistant:  None  Anesthesia:  General  LMA  Preparation:  After appropriate preanesthesia, the patient was brought to the operative room, placed on the operating table in the dorsal supine position where general LMA anesthesia was induced. She was then replaced the dorsal lithotomy position with the pubis was prepped with Betadine solution and draped in usual fashion. The arm band was double checked. The history was double checked.  Review history:  . Multiple sclerosis (340)  Assessed By: Jethro Bolus (Urology); Last Assessed: 18 Mar 2014 2. Urinary incontinence without sensory awareness (788.34)  Assessed By: Jetta Lout (Urology); Last Assessed: 27 Jan 2014  History of Present Illness   64 year old female with multiple sclerosis returns today to discuss getting s/p tube put back in. She was seen in the ER on 02/17/14 for bilateral LE weakness, abdominal pain, & N/V. CT scan showed displaced s/p tube, therefore it was removed. A regular foley was placed. Home health had changed the s/p tube on 02/16/14 as the 1st change, but it was not properly placed into the bladder. S/p cysto/Botox/s/p tube placement on on 01/15/14.    History of chronic Foley catheterization complicated by chronic urinary tract infection.     Foley catheter was removed 1 year ago, and since that time, the patient has complained of urge and urge incontinence. She wears a diaper constantly. The patient denies skin breakdown. She is considering having suprapubic tube placement. She did have cystostomy, and placement of 20 French silicone suprapubic tube in December 2012, but this was complicated by abscess of the suprapubic  tube site. At that time, she was irrigating the suprapubic tube site twice weekly with acetic acid. Note her past medical history of anxiety, depression, diabetes, and multiple sclerosis. She is wheelchair-bound. She is being considered for suprapubic tube and Botox bladder injection versus ileal loop.     Statement of  Likelihood of Success: Excellent. TIME-OUT observed.:  Procedure:  Inspection of the abdomen showed that the patient was morbidly obese, with a very large panniculus. The area of insertion of SP tube previously was noted. The patient was placed in moderate Trendelenburg, 2 allow the intestines to float more proximally toward. However, because of anesthesia concerns, she was not placed in extreme Trendelenburg. The legs were in lithotomy position. Because of the patient's iodine allergy, she was prepped with antibiotic soap solution.  The urethra appeared to be somewhat gaping. Cystoscopy was accomplished, and showed marked trabeculation inside will formation. Previous area of 2 pubic catheterization was noted within the bladder, as well as on the superior pubic surface. With the bladder as full as possible, I placed a 23 cm spinal needle through the previous area of suprapubic placement, in order to identify where the bladder it would enter. After identifying the needle in the anterior bladder wall, the needle was removed, and a Lowsley tractor was placed. With the downward pressure on the panniculus, a 15 blade was used to cut down on the Lowsley tractor, until the tractor was placed through the wound. An 38 French Silastic catheter was then placed within the jaws of the Lowsley and brought into the bladder. However the catheter snapped off the jaws. This required replacement of the Lowsley through the bladder wall into the wound,  and this time, with the catheter was placed in the jaws, I sutured the catheter with a 0 silk suture to the jaws of the Lowsley. The Lowsley was then brought  through the bladder and through the urethra into the external urethral surface, and then I slowly pulled the catheter retrograde, following it with the cystoscope, until was it within the bladder. 20 mL of sterile water were then placed within the balloon, and I was able to see the balloon snugly fit against the anterior wall of the bladder. The catheter irrigated well. The bladder was drained of fluid. The patient was replaced in the supine position. She tolerated procedure well. There was bleeding noted from the incision site. This required 2 sutures of 2-0 Vicryl suture. I then placed 2 sutures of 2-0 nylon, in order to ensure that subcutaneous bleeding was stopped. A sterile dressing was placed. The patient was awakened, taken to recovery in good condition. I irrigated the subcutaneous pubic tube one last time to be sure that the urine was clear.

## 2014-08-19 NOTE — Transfer of Care (Signed)
Immediate Anesthesia Transfer of Care Note  Patient: [name]  Procedure(s) Performed: Procedure(s): PLACEMENT OF SUPRAPUBIC TUBE ,cystoscopy, control of bleeding (N/A)  Patient Location: PACU  Anesthesia Type:General  Level of Consciousness: awake, alert , oriented and pateint uncooperative  Airway & Oxygen Therapy: Patient Spontanous Breathing and Patient connected to face mask oxygen  Post-op Assessment: Report given to RN and Post -op Vital signs reviewed and stable  Post vital signs: Reviewed and stable  Last Vitals:  Filed Vitals:   08/19/14 0632  BP: 130/66  Pulse: 87  Temp: 36.7 C  Resp: 18    Complications: No apparent anesthesia complications

## 2014-08-19 NOTE — Discharge Instructions (Addendum)
Multiple Sclerosis Multiple sclerosis (MS) is a disease of the central nervous system. It leads to the loss of the insulating covering of the nerves (myelin sheath) of your brain. When this happens, brain signals do not get sent properly or may not get sent at all. The age of onset of MS varies.  CAUSES The cause of MS is unknown. However, it is more common in the [place] than in the [place]. RISK FACTORS There is a higher number of women with MS than men. MS is not an illness that is passed down to you from your family members (inherited). However, your risk of MS is higher if you have a relative with MS. SIGNS AND SYMPTOMS  The symptoms of MS occur in episodes or attacks. These attacks may last weeks to months. There may be long periods of almost no symptoms between attacks. The symptoms of MS vary. This is because of the many different ways it affects the central nervous system. The main symptoms of MS include:  Vision problems and eye pain.  Numbness.  Weakness.  Inability to move your arms, hands, feet, or legs (paralysis).  Balance problems.  Tremors. DIAGNOSIS  Your health care provider can diagnose MS with the help of imaging exams and lab tests. These may include specialized X-ray exams and spinal fluid tests. The best imaging exam to confirm a diagnosis of MS is an MRI. TREATMENT  There is no known cure for MS, but there are medicines that can decrease the number and frequency of attacks. Steroids are often used for short-term relief. Physical and occupational therapy may also help. There are also many new alternative or complementary treatments available to help control the symptoms of MS. Ask your health care provider if any of these other options are right for you. HOME CARE INSTRUCTIONS   Take medicines as directed by your health care provider.  Exercise as directed by your health care provider. SEEK MEDICAL CARE IF: You begin to feel  depressed. SEEK IMMEDIATE MEDICAL CARE IF:  You develop paralysis.  You have problems with bladder, bowel, or sexual function.  You develop mental changes, such as forgetfulness or mood swings.  You have a period of uncontrolled movements (seizure). Document Released: 06/15/2000 Document Revised: 06/23/2013 Document Reviewed: 02/23/2013 Exit[employer] Patient Information 2015 [employer]. This information is not intended to replace advice given to you by your health care provider. Make sure you discuss any questions you have with your health care provider. Multiple Sclerosis Multiple sclerosis (MS) is a disease of the central nervous system. It leads to the loss of the insulating covering of the nerves (myelin sheath) of your brain. When this happens, brain signals do not get sent properly or may not get sent at all. The age of onset of MS varies.  CAUSES The cause of MS is unknown. However, it is more common in the [place] than in the [place]. RISK FACTORS There is a higher number of women with MS than men. MS is not an illness that is passed down to you from your family members (inherited). However, your risk of MS is higher if you have a relative with MS. SIGNS AND SYMPTOMS  The symptoms of MS occur in episodes or attacks. These attacks may last weeks to months. There may be long periods of almost no symptoms between attacks. The symptoms of MS vary. This is because of the many different ways it affects the central nervous system. The   main symptoms of MS include:  Vision problems and eye pain.  Numbness.  Weakness.  Inability to move your arms, hands, feet, or legs (paralysis).  Balance problems.  Tremors. DIAGNOSIS  Your health care provider can diagnose MS with the help of imaging exams and lab tests. These may include specialized X-ray exams and spinal fluid tests. The best imaging exam to confirm a diagnosis of MS is an MRI. TREATMENT  There is no  known cure for MS, but there are medicines that can decrease the number and frequency of attacks. Steroids are often used for short-term relief. Physical and occupational therapy may also help. There are also many new alternative or complementary treatments available to help control the symptoms of MS. Ask your health care provider if any of these other options are right for you. HOME CARE INSTRUCTIONS   Take medicines as directed by your health care provider.  Exercise as directed by your health care provider. SEEK MEDICAL CARE IF: You begin to feel depressed. SEEK IMMEDIATE MEDICAL CARE IF:  You develop paralysis.  You have problems with bladder, bowel, or sexual function.  You develop mental changes, such as forgetfulness or mood swings.  You have a period of uncontrolled movements (seizure). Document Released: 06/15/2000 Document Revised: 06/23/2013 Document Reviewed: 02/23/2013 Exit[employer] Patient Information 2015 [employer]. This information is not intended to replace advice given to you by your health care provider. Make sure you discuss any questions you have with your health care provider.  

## 2014-08-19 NOTE — H&P (Signed)
Reason For Visit S/p tube change   Active Problems Problems  1. Multiple sclerosis (340)   Assessed By: Jethro Bolus (Urology); Last Assessed: 18 Mar 2014 2. Urinary incontinence without sensory awareness (788.34)   Assessed By: Jetta Lout (Urology); Last Assessed: 27 Jan 2014  History of Present Illness    64 year old female with multiple sclerosis returns today to discuss getting s/p tube put back in. She was seen in the ER on 02/17/14 for bilateral LE weakness, abdominal pain, & N/V. CT scan showed displaced s/p tube, therefore it was removed. A regular foley was placed. Home health had changed the s/p tube on 02/16/14 as the 1st change, but it was not properly placed into the bladder. S/p cysto/Botox/s/p tube placement on on 01/15/14.    History of chronic Foley catheterization complicated by chronic urinary tract infection.      Foley catheter was removed 1 year ago, and since that time, the patient has complained of urge and urge incontinence. She wears a diaper constantly. The patient denies skin breakdown. She is considering having suprapubic tube placement. She did have cystostomy, and placement of 20 French silicone suprapubic tube in December 2012, but this was complicated by abscess of the suprapubic tube site. At that time, she was irrigating the suprapubic tube site twice weekly with acetic acid. Note her past medical history of anxiety, depression, diabetes, and multiple sclerosis. She is wheelchair-bound. She is being considered for suprapubic tube and Botox bladder injection versus ileal loop.    Urodynamics is accomplished on 11/11/13, in the sitting position. The maximum cystometric capacity is 250 cc. The bladder is unstable, with first contraction occurring at 123 cc. Maximum unstable bladder contraction pressure is 11-21 cm of water with leakage. The patient can feel her bladder being filled.    The patient did not leak for leak point pressure  determination, although cough and Valsalva were extremely weak.    Pressure flow study: The volume voided was approximately 150 cc, with maximum detrusor pressure of 21 cm of water. PVRs approximately 100 cc. EMG shows increased external sphincter dyssynergia.    Study was performed with the patient and her wheelchair, therefore x-ray was not utilized. There was a large amount of artifact during the early part of the study. The patient appears to have a maximum capacity of 200-250 cc. No stress incontinence that is identified. Her cough and Valsalva are exceptionally weak. She does have mild instability. During filling, 3 separate involuntary voiding episodes were recorded. She did leak. Her detrusor leak point pressures ranged from 11-21 cm of water. EMG activity was increased. The patient did not empty well, leaving it up proximally 75-100 cc.   Past Medical History Problems  1. History of Anxiety (300.00) 2. History of Arthritis (V13.4) 3. History of depression (V11.8) 4. History of diabetes mellitus (V12.29) 5. History of esophageal reflux (V12.79) 6. History of hypothyroidism (V12.29) 7. History of Multiple sclerosis (340)  Surgical History Problems  1. History of Ankle Surgery 2. History of Bladder Cystotomy With Drainage 3. History of Bladder Cystotomy With Drainage 4. History of Cystourethroscopy With Irrigation And Evacuation Of Clots 5. History of Tonsillectomy 6. History of Urologic Surgery 7. History of Wrist Surgery  Current Meds 1. Acetaminophen 500 MG Oral Tablet;  Therapy: (Recorded:30Oct2012) to Recorded 2. Amantadine HCl - 100 MG Oral Tablet; prn;  Therapy: (Recorded:12Mar2015) to Recorded 3. Aspirin 81 MG Oral Tablet;  Therapy: (Recorded:30Oct2012) to Recorded 4. Atorvastatin Calcium 20 MG Oral Tablet;  Therapy: 30Oct2012  to Recorded 5. Baclofen TABS;  Therapy: (Recorded:30Oct2012) to Recorded 6. Cranberry TABS;  Therapy: (Recorded:31Jan2013) to  Recorded 7. Cyclobenzaprine HCl - 10 MG Oral Tablet;  Therapy: 21May2015 to Recorded 8. Enablex 7.5 MG Oral Tablet Extended Release 24 Hour;  Therapy: (Recorded:30Oct2012) to Recorded 9. Furosemide 20 MG Oral Tablet;  Therapy: 06Mar2012 to Recorded 10. Gabapentin 400 MG Oral Capsule;   Therapy: 19Jul2013 to Recorded 11. Ibuprofen 800 MG Oral Tablet;   Therapy: (Recorded:30Oct2012) to Recorded 12. Levothyroxine Sodium 125 MCG Oral Tablet;   Therapy: 26Feb2014 to Recorded 13. Meclizine HCl - 25 MG Oral Tablet; prn;   Therapy: 04Jan2013 to Recorded 14. Metamucil CAPS;   Therapy: (Recorded:31Jan2013) to Recorded 15. Multi-Day Vitamins TABS;   Therapy: (Recorded:30Oct2012) to Recorded 16. NexIUM 40 MG Oral Capsule Delayed Release;   Therapy: (Recorded:30Oct2012) to Recorded 17. Nitrofurantoin 50 MG CAPS; Pt takes 50 mg q day for supression.  Rx prescribed by Dr.   Redmond School;   Therapy: (Recorded:30Apr2015) to Recorded 18. Sertraline HCl - 25 MG Oral Tablet;   Therapy: 23Oct2012 to Recorded  Allergies Medication  1. Avelox TABS 2. Cipro TABS 3. Penicillins 4. Trimethoprim TABS  Family History Problems  1. Family history of Death In The Family Mother : Father   75 yo COPD 2. Family history of Family Health Status - Father's Age : Father 3. Family history of Hypertension (V17.49) : Father 4. Family history of Type 2 Diabetes Mellitus : Father 5. Family history of Urinary Calculus : Father  Social History Problems  1. Denied: History of Alcohol Use 2. Denied: History of Caffeine Use 3. Marital History - Currently Married 4. Never A Smoker 5. Physical Disability Affecting Ability To Work  Review of Systems Genitourinary, constitutional, skin, eye, otolaryngeal, hematologic/lymphatic, cardiovascular, pulmonary, endocrine, musculoskeletal, gastrointestinal, neurological and psychiatric system(s) were reviewed and pertinent findings if present are noted.  Genitourinary:  incontinence.    Vitals Vital Signs [Data Includes: Last 1 Day]  Recorded: 17Sep2015 10:54AM  Blood Pressure: 136 / 85 Temperature: 97.6 F Heart Rate: 102  Procedure 18 fr foley replaced today without difficulty. Foley irrigated with return & was hooked to an overnight bag.     Assessment Assessed  1. Catheter-associated urinary tract infection (996.64,599.0) 2. Multiple sclerosis (340) 3. Urinary incontinence with continuous leakage (788.37)  64 yo female now 1 month post ER evaluation of leaking S-P tube following replacement hy Home Health, with finding of s-p tube outside of the bladder.She is post cysto, s-p tube and Botox injection, and is doing well from Botox, with resolution of her incontnence, but necessity of a foley catheter. We have discussed problems with long term foley catheters, including urethral erosion, and we both agree that she should have another s-p tube placed. She will have her S-P tube changed by her NP, [name]. This has now happened a 2nd time, and pt is for S=P tube placement again.   Plan Acute cystitis without hematuria  1. BUN & CREATININE; Status:Canceled - Specimen/Data Collection;  Urge and stress incontinence  2. Follow-up Office  Follow-up  Status: Canceled - Date of Service Urinary incontinence without sensory awareness  3. Cath, simple, wIinsert Temp Cath; Status:Complete;   Done: 17Sep2015  Schedule S-p tube replacement  Signatures Electronically signed by : Jethro Bolus, M.D.; Mar 18 2014 11:53AM EST

## 2014-08-19 NOTE — Anesthesia Procedure Notes (Signed)
Procedure Name: LMA Insertion Date/Time: 08/19/2014 9:28 AM Performed by: Delphia Grates Pre-anesthesia Checklist: Patient identified, Emergency Drugs available, Suction available and Patient being monitored Patient Re-evaluated:Patient Re-evaluated prior to inductionOxygen Delivery Method: Circle system utilized Preoxygenation: Pre-oxygenation with 100% oxygen Intubation Type: IV induction LMA: LMA inserted LMA Size: 4.0 Number of attempts: 1 Placement Confirmation: positive ETCO2 and breath sounds checked- equal and bilateral Tube secured with: Tape Dental Injury: Teeth and Oropharynx as per pre-operative assessment

## 2014-08-20 ENCOUNTER — Encounter (HOSPITAL_COMMUNITY): Payer: Self-pay | Admitting: Urology

## 2015-10-08 IMAGING — CT CT ABD-PELV W/ CM
1 of 4 series · 13 of 32 positions shown, 18 images · IV contrast (OMNIPAQUE 300)
Comparison: 03/20/2012.

ADDENDUM:
The catheter noted in the pelvis is actually a suprapubic catheter
which has pulled out of the bladder. On the sagittal sequence the
tip of the catheter is approximately 11 mm from the dome of the
bladder.
CLINICAL DATA: Abdominal pain and distention.

EXAM:
CT ABDOMEN AND PELVIS WITH CONTRAST
TECHNIQUE: Multidetector CT imaging of the abdomen and pelvis was performed
using the standard protocol following bolus administration of
intravenous contrast.
CONTRAST:  50mL OMNIPAQUE IOHEXOL 300 MG/ML SOLN, 100mL OMNIPAQUE
IOHEXOL 300 MG/ML SOLN

[Series 2: abd/pel with · axial · 0.88mm/px · z∈[-590,-230]mm · 13 of 82 slices shown, 18 images]
[im 5/82  soft-tissue]
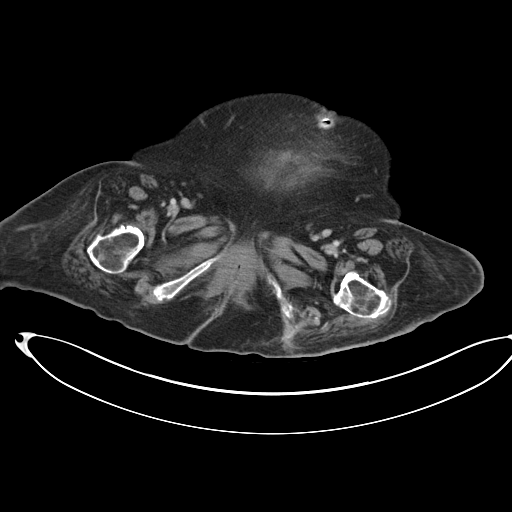
[im 5/82  bone]
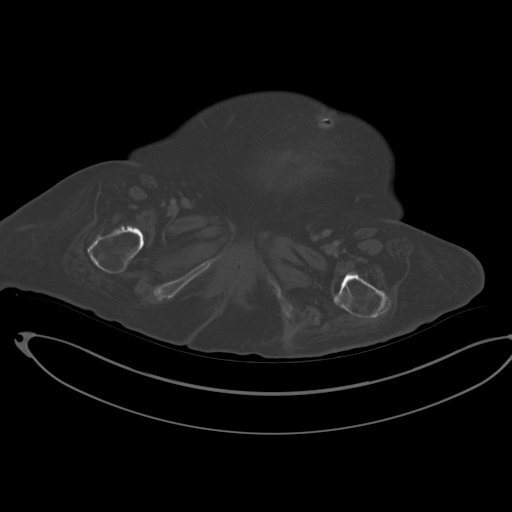
[im 13/82  soft-tissue]
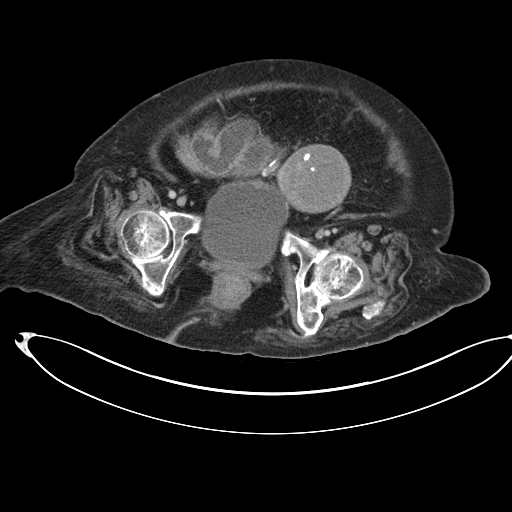
[im 18/82  soft-tissue]
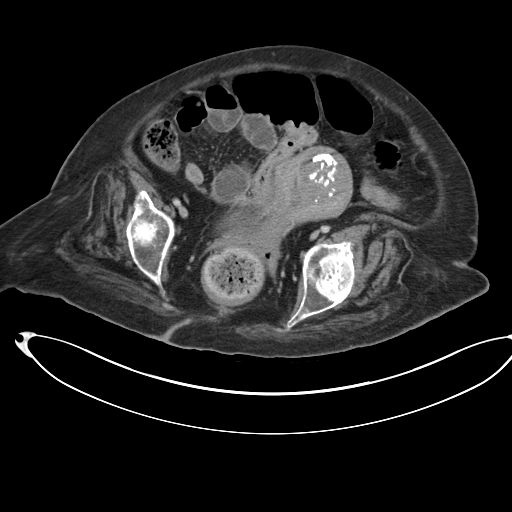
[im 26/82  soft-tissue]
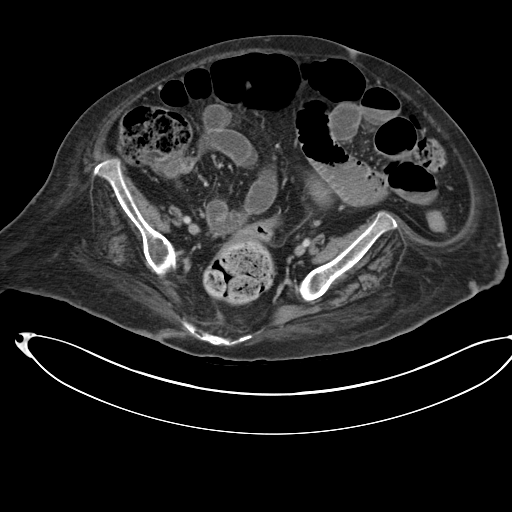
[im 30/82  soft-tissue]
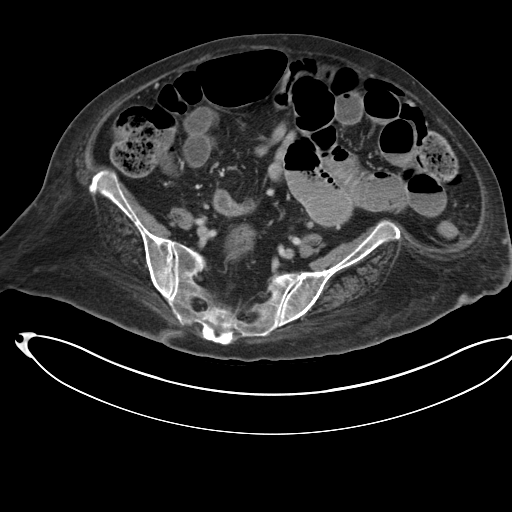
[im 39/82  soft-tissue]
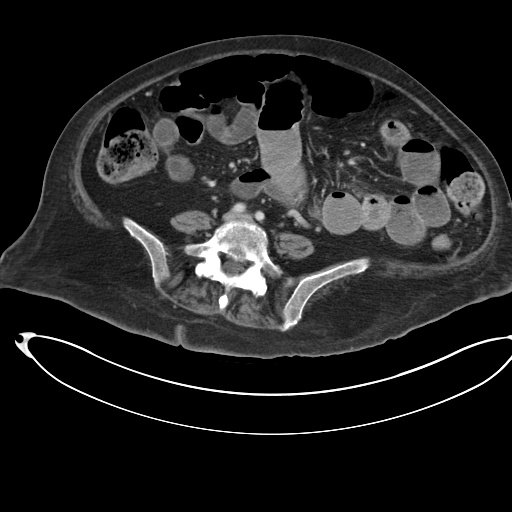
[im 43/82  soft-tissue]
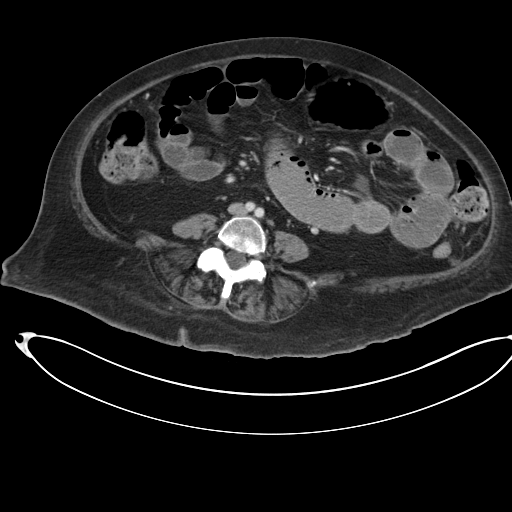
[im 52/82  soft-tissue]
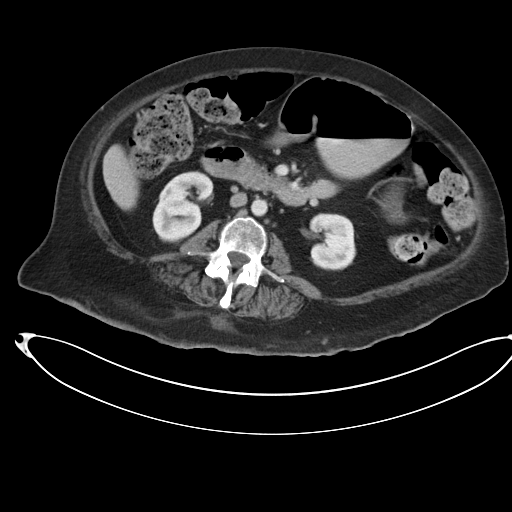
[im 56/82  soft-tissue]
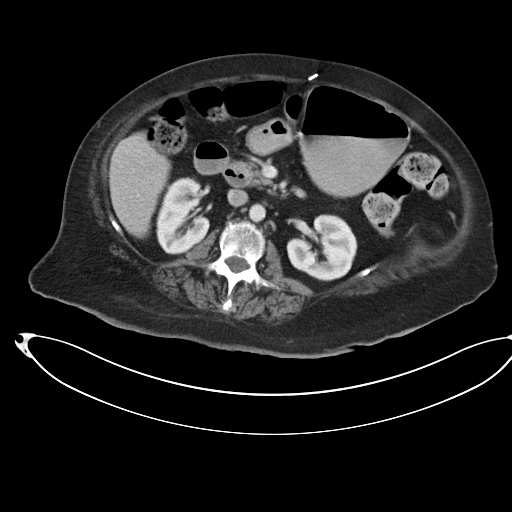
[im 56/82  bone]
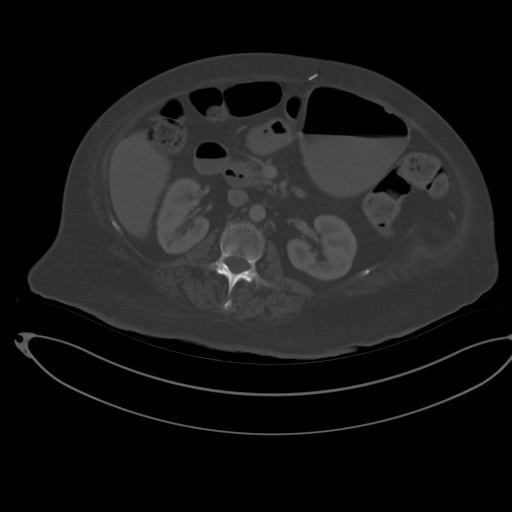
[im 64/82  soft-tissue]
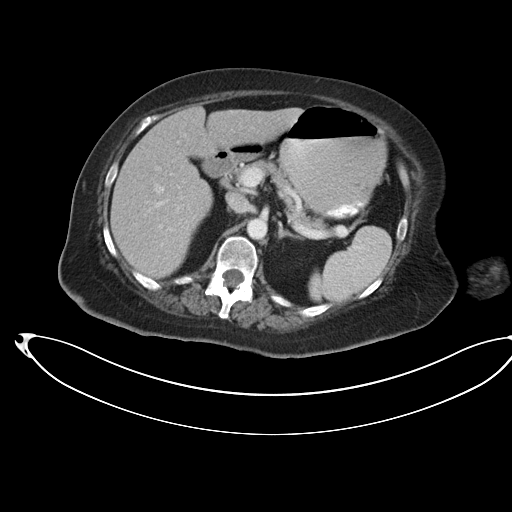
[im 64/82  lung]
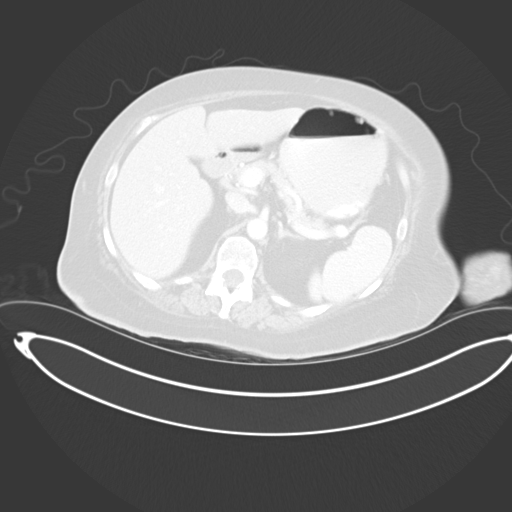
[im 69/82  soft-tissue]
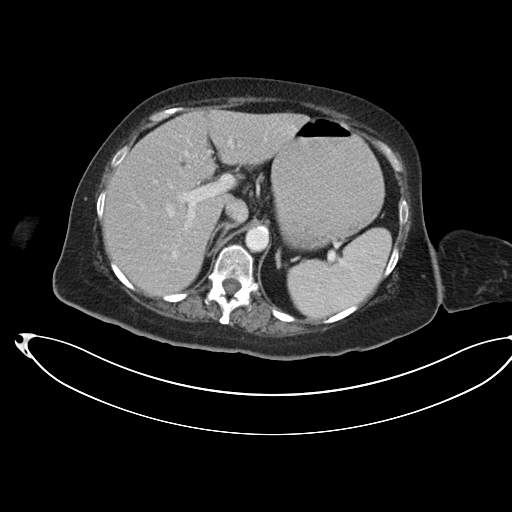
[im 69/82  lung]
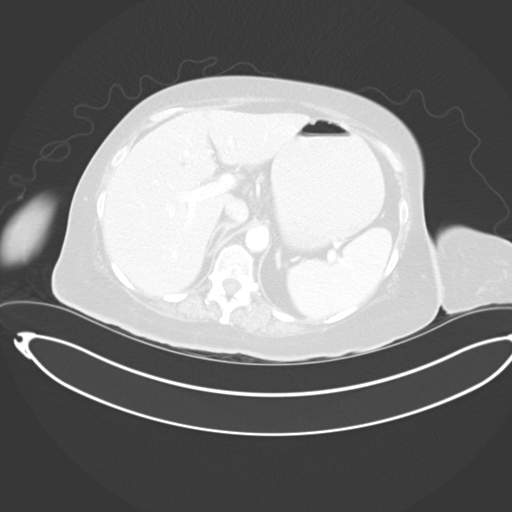
[im 73/82  lung]
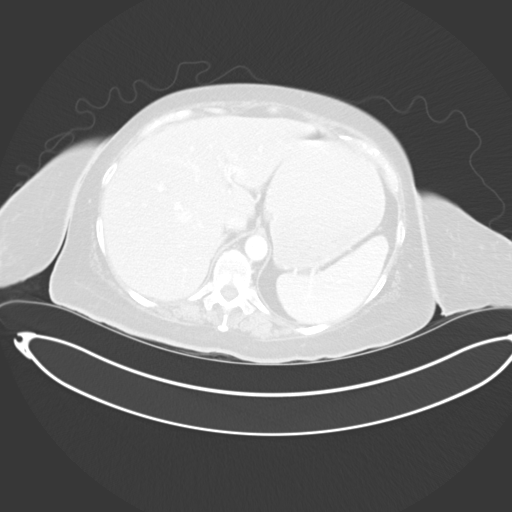
[im 77/82  soft-tissue]
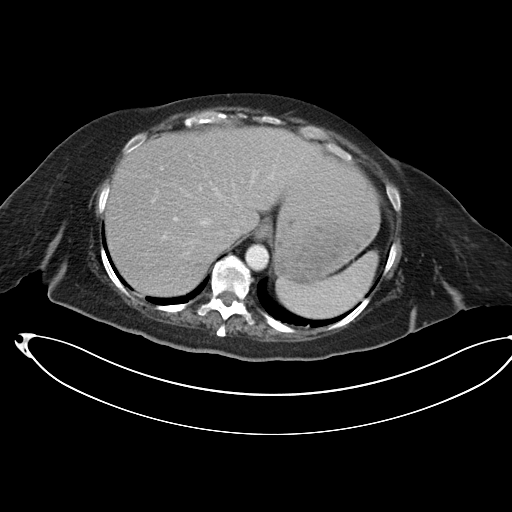
[im 77/82  lung]
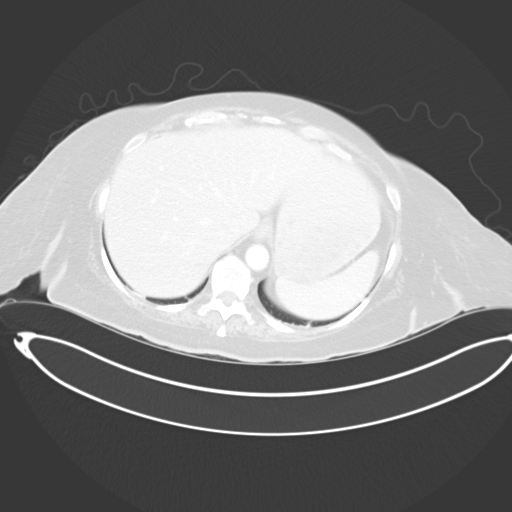

[13 of 32 positions shown; findings below may reference images not displayed]

FINDINGS: Lung bases:

Minimal dependent subpleural atelectasis. The heart is normal in
size. No pericardial effusion. The distal esophagus is grossly
normal.

CT abdomen:

The liver is unremarkable. No focal hepatic lesions or intrahepatic
biliary dilatation. The gallbladder surgically absent. No common
bile duct dilatation the pancreas is normal. A small duodenum
diverticulum is noted near the pancreatic head. The spleen is within
normal limits in size. No focal lesions. The adrenal glands and
kidneys are unremarkable.

The stomach is mildly distended. The duodenum appears unremarkable.
The small bowel is dilated and there are air-fluid levels consistent
with obstruction. There is a transition to nondilated/ decompressed
distal small bowel loops. This transition appears to be in the mid
abdomen likely in the proximal ileum. It is best seen on series 2,
image number 47 and series 5, image number 51. No mass is
identified. This is likely due to adhesions. The colon is
unremarkable. Scattered air and stool but no mass or inflammation.

CT pelvis: Stable large calcified fibroid on the left side. There is
a peritoneal dialysis catheter in the pelvis. No significant free
pelvic fluid collections. The bladder is unremarkable. Moderate
stool in the rectum. The aorta and branch vessels are normal. No
mesenteric or retroperitoneal mass or adenopathy

Bony structures: No significant findings. Moderate degenerative
changes involving the hips and spine. No destructive bony changes.
IMPRESSION: Small bowel obstruction in the central mid abdomen likely due to
adhesions as discussed above.

Stable large partially calcified fibroid.

Intrapelvic catheter is noted.

## 2016-03-06 ENCOUNTER — Emergency Department (HOSPITAL_BASED_OUTPATIENT_CLINIC_OR_DEPARTMENT_OTHER)
Admission: EM | Admit: 2016-03-06 | Discharge: 2016-03-07 | Disposition: A | Discharge status: Home / Self Care | Payer: Medicare Other | Attending: Emergency Medicine | Admitting: Emergency Medicine

## 2016-03-06 ENCOUNTER — Emergency Department (HOSPITAL_BASED_OUTPATIENT_CLINIC_OR_DEPARTMENT_OTHER): Payer: Medicare Other

## 2016-03-06 DIAGNOSIS — E119 Type 2 diabetes mellitus without complications: Secondary | ICD-10-CM | POA: Diagnosis not present

## 2016-03-06 DIAGNOSIS — E039 Hypothyroidism, unspecified: Secondary | ICD-10-CM | POA: Diagnosis not present

## 2016-03-06 DIAGNOSIS — T83098A Other mechanical complication of other indwelling urethral catheter, initial encounter: Secondary | ICD-10-CM | POA: Insufficient documentation

## 2016-03-06 DIAGNOSIS — Y829 Unspecified medical devices associated with adverse incidents: Secondary | ICD-10-CM | POA: Insufficient documentation

## 2016-03-06 DIAGNOSIS — T83028A Displacement of other indwelling urethral catheter, initial encounter: Secondary | ICD-10-CM | POA: Diagnosis present

## 2016-03-06 DIAGNOSIS — T83090A Other mechanical complication of cystostomy catheter, initial encounter: Secondary | ICD-10-CM

## 2016-03-06 DIAGNOSIS — Z7982 Long term (current) use of aspirin: Secondary | ICD-10-CM | POA: Insufficient documentation

## 2016-03-06 NOTE — ED Provider Notes (Signed)
[facility] Provider Note   CSN: [id number] Arrival date & time: 03/06/16  2011  By signing my name below, I, Aggie MoatsJenny Song, attest that this documentation has been prepared under the direction and in the presence of Mattie MarlinJessica Shataya Winkles, PA-C. Electronically signed by: Aggie MoatsJenny Song, ED Scribe. 03/06/16. 9:33 PM.  History   Chief Complaint Chief Complaint  Patient presents with  . Other    displaced superpubic catheter   The history is provided by the patient. No language interpreter was used.   HPI Comments:  [name] is a 65 y.o. female brought in by ambulance, who presents to the Emergency Department complaining of displaced suprapubic catheter, which she noticed 2-3 hours ago. Pt reports that she had a suprapubic catheter changed today by [facility] and her home nurse noticed the balloon and tip extending from the vaginal opening during her evening bath. Associated symptoms include lower abdominal and vaginal pain and controlled bleeding at site of insertion and near vagina. No modifying factors or denials reported.   Past Medical History:  Diagnosis Date  . Anxiety   . Arthritis    old falls-with injuries to rt knee and both ankles and rt wrist--states she has some pain in these areas-probable arthritis  . Arthritis    "hands" (09/03/2013)     Hoyer Lift used for transrers  . Bedridden 09/02/2013  . Blindness of right eye    complication of ms  . Cellulitis    hx of cellulitis in lower extremities -requiring multiple hospitalzations in the past--no problems in las 2 yrs  . GERD (gastroesophageal reflux disease)    nexium takes care  . High cholesterol   . Hypothyroidism   . Multiple sclerosis    pt is bedridden -does get up to recliner by hoyer lift; mentally alert, able to feed herself -no trouble swallowing. lives at home with husband and has 24 hour caregivers.  . Neuromuscular disorder    ms-not ambulatory-muscle spasms-and recent tremors left  arm and left leg have developed Dr.--. Daphane ShepherdMeyer in [place] is pt's neurologist  . Thyroid disease   . Type II diabetes mellitus    diet control - no longer takes any diabetic meds  . Urinary incontinence    neurogenic bladder--hx of uti's  . UTI (lower urinary tract infection)     Patient Active Problem List   Diagnosis Date Noted  . AKI (acute kidney injury) (HCC) 02/18/2014  . SBO (small bowel obstruction) (HCC) 02/17/2014  . Leukocytosis 02/17/2014  . Dyslipidemia 02/17/2014  . Neuropathic pain 02/17/2014  . Sacral decubitus ulcer, stage II 09/02/2013  . Bedridden 09/02/2013  . UTI (urinary tract infection) 09/01/2013  . DM type 2 (diabetes mellitus, type 2) (HCC) 09/01/2013  . Multiple sclerosis (HCC)   . GERD (gastroesophageal reflux disease)   . Hypothyroidism   . Blindness of right eye     Past Surgical History:  Procedure Laterality Date  . ADENOIDECTOMY    . CHOLECYSTECTOMY    . CYSTOSCOPY  07/02/2011   Procedure: CYSTOSCOPY;  Surgeon: Kathi LudwigSigmund I Tannenbaum, MD;  Location: [facility];  Service: Urology;  Laterality: N/A;  . FRACTURE SURGERY     Left Ankle  . INSERTION OF SUPRAPUBIC CATHETER N/A 01/15/2014   Procedure: CYSTOSCOPY WITH BOTOX INJECTION/SUPRAPUBIC TUBE PLACEMENT;  Surgeon: Kathi LudwigSigmund I Tannenbaum, MD;  Location: [facility];  Service: Urology;  Laterality: N/A;  . INSERTION OF SUPRAPUBIC CATHETER N/A 04/09/2014   Procedure: SUPRAPUBIC TUBE PLACEMENT ;  Surgeon: Kathi Ludwig, MD;  Location: [facility];  Service: Urology;  Laterality: N/A;  . INSERTION OF SUPRAPUBIC CATHETER N/A 08/19/2014   Procedure: PLACEMENT OF SUPRAPUBIC TUBE ,cystoscopy, control of bleeding;  Surgeon: Kathi Ludwig, MD;  Location: [facility];  Service: Urology;  Laterality: N/A;  . LYMPH GLAND EXCISION    . MEDIASTINOSCOPY  04/24/10   for enlarged right paratracheal lymph node--surgery at [facility]-Dr. Burney--pt states she does not know the  what the pathology report showed.  . ORIF ANKLE  FRACTURE Left 2005  . TONSILLECTOMY    . WRIST SURGERY Right     OB History    No data available       Home Medications    Prior to Admission medications   Medication Sig Start Date End Date Taking? Authorizing Provider  aspirin EC 81 MG tablet Take 81 mg by mouth daily.    Historical Provider, MD  atorvastatin (LIPITOR) 20 MG tablet Take 20 mg by mouth at bedtime.    Historical Provider, MD  CRANBERRY PO Take 1 tablet by mouth daily as needed (for urinary pain).     Historical Provider, MD  cyclobenzaprine (FLEXERIL) 5 MG tablet Take 5-10 mg by mouth See admin instructions. Pt takes 5 mg three times daily and then 10 mg nightly at bedtime.    Historical Provider, MD  darifenacin (ENABLEX) 7.5 MG 24 hr tablet Take 7.5 mg by mouth 2 (two) times daily.    Historical Provider, MD  diphenhydrAMINE (BENADRYL) 25 MG tablet Take 50 mg by mouth every 6 (six) hours as needed for itching.    Historical Provider, MD  docusate sodium (COLACE) 100 MG capsule Take 100 mg by mouth daily as needed for mild constipation.    Historical Provider, MD  esomeprazole (NEXIUM) 40 MG capsule Take 40 mg by mouth every morning.     Historical Provider, MD  furosemide (LASIX) 20 MG tablet Take 20 mg by mouth 2 (two) times daily.    Historical Provider, MD  gabapentin (NEURONTIN) 400 MG capsule Take 800 mg by mouth 4 (four) times daily.     Historical Provider, MD  levothyroxine (SYNTHROID) 125 MCG tablet Take 1 tablet (125 mcg total) by mouth daily before breakfast. 05/11/13   Jeralyn Bennett, MD  liver oil-zinc oxide (DESITIN) 40 % ointment Apply 1 application topically as needed for irritation.    Historical Provider, MD  meclizine (ANTIVERT) 25 MG tablet Take 25 mg by mouth 3 (three) times daily as needed for dizziness.    Historical Provider, MD  mineral oil enema Place 1 enema rectally once.    Historical Provider, MD  mineral oil-hydrophilic petrolatum (AQUAPHOR) ointment Apply 1 application topically as  needed for dry skin.    Historical Provider, MD  Multiple Vitamin (MULTIVITAMIN WITH MINERALS) TABS tablet Take 1 tablet by mouth daily.    Historical Provider, MD  nitrofurantoin (MACRODANTIN) 50 MG capsule Take 50 mg by mouth daily.    Historical Provider, MD  nystatin (MYCOSTATIN/NYSTOP) 100000 UNIT/GM POWD Apply 1 Bottle topically 2 (two) times daily. To perineal/groin area and right great toe as needed for yeast    Historical Provider, MD  nystatin cream (MYCOSTATIN) Apply 1 application topically as needed for dry skin.    Historical Provider, MD  oxyCODONE-acetaminophen (ROXICET) 5-325 MG per tablet Take 1 tablet by mouth every 4 (four) hours as needed for severe pain. 08/19/14   Jethro Bolus, MD  simethicone (GAS-X EXTRA STRENGTH) 125 MG chewable tablet Dorna Bloom  125 mg by mouth every 6 (six) hours as needed for flatulence.    Historical Provider, MD  sulfamethoxazole-trimethoprim (BACTRIM DS,SEPTRA DS) 800-160 MG per tablet Take 1 tablet by mouth 2 (two) times daily. For 7 days.    Historical Provider, MD  traMADol-acetaminophen (ULTRACET) 37.5-325 MG per tablet Take 1 tablet by mouth every 6 (six) hours as needed. Patient not taking: Reported on 08/19/2014 04/09/14   Jethro Bolus, MD  trolamine salicylate (ASPERCREME) 10 % cream Apply 1 application topically as needed for muscle pain.    Historical Provider, MD    Family History No family history on file.  Social History Social History  Substance Use Topics  . Smoking status: Never Smoker  . Smokeless tobacco: Never Used  . Alcohol use No     Allergies   Avelox [moxifloxacin hcl in nacl]; Ciprofloxacin hcl; Penicillins; and Shrimp [shellfish allergy]   Review of Systems Review of Systems  Constitutional: Negative for fever.  Gastrointestinal: Positive for abdominal pain. Negative for nausea and vomiting.  Genitourinary: Positive for vaginal bleeding and vaginal pain.     Physical Exam Updated Vital Signs BP 135/64  (BP Location: Right Arm)   Pulse 104   Temp 98.2 F (36.8 C) (Oral)   Resp 20   SpO2 98%   Physical Exam  Constitutional: She appears well-developed and well-nourished. No distress.  HENT:  Head: Normocephalic and atraumatic.  Eyes: Conjunctivae are normal.  Pulmonary/Chest: Effort normal. No respiratory distress.  Abdominal: Bowel sounds are normal.  Suprapubic port with surrounding erythema and mild edema, no induration or increased warmth, no signs of infection, foley tube in place  GU, balloon of catheter protruding from vaginal canal, urine noted coming from the area around the balloon  Musculoskeletal: Normal range of motion.  Neurological: She is alert. Coordination normal.  Skin: Skin is warm and dry. She is not diaphoretic.  Psychiatric: She has a normal mood and affect. Her behavior is normal.  Nursing note and vitals reviewed.    ED Treatments / Results  DIAGNOSTIC STUDIES:  Oxygen Saturation is 98% on room air, normal by my interpretation.    COORDINATION OF CARE:  9:29 PM Discussed treatment plan with pt at bedside, which includes CT scan, and pt agreed to plan.  Labs (all labs ordered are listed, but only abnormal results are displayed) Labs Reviewed - No data to display  EKG  EKG Interpretation None       Radiology Dg Abdomen 1 View  Result Date: 03/07/2016 CLINICAL DATA:  65 year old female with suprapubic catheter placement. EXAM: ABDOMEN - 1 VIEW COMPARISON:  Cystogram 12/24/2013 and CT of the abdomen pelvis dated 02/07/2014 FINDINGS: The catheter is noted with balloon over the bladder. There is moderate stool throughout the colon. Partially calcified uterine fibroid in the left hemipelvis. There is advanced osteopenia with degenerative changes of the lower lumbar spine and hips. No definite acute fracture. IMPRESSION: Suprapubic catheter with balloon tip projecting over the bladder. Electronically Signed   By: Elgie Collard M.D.   On: 03/07/2016  00:09    Procedures SUPRAPUBIC TUBE PLACEMENT Date/Time: 03/07/2016 12:28 AM Performed by: Rhona Raider, Lanay Zinda L Authorized by: Mattie Marlin L   Consent:    Consent obtained:  Verbal   Consent given by:  Patient   Risks discussed:  Bleeding, infection and pain Anesthesia (see MAR for exact dosages):    Anesthesia method:  None Procedure details:    Complexity:  Simple   Catheter type:  Foley  Catheter size:  14 Fr   Ultrasound guidance: no     Number of attempts:  1   Urine characteristics:  Clear and yellow Post-procedure details:    Patient tolerance of procedure:  Tolerated well, no immediate complications Comments:     Area was properly cleaned with Betadine and sterile technique was used   (including critical care time)  Medications Ordered in ED Medications - No data to display   Initial Impression / Assessment and Plan / ED Course  I have reviewed the triage vital signs and the nursing notes.  Pertinent labs & imaging results that were available during my care of the patient were reviewed by me and considered in my medical decision making (see chart for details).  Clinical Course    It is likely that the misplaced catheter was pushed through the urethra as urine was noted on exam. Suprapubic catheter was removed and a Foley catheter was placed in the supra pubic region. No signs of surrounding infection. X-rays were taken and reviewed by me confirmed placement. Instructed patient to follow-up with her primary care provider tomorrow to have catheter replaced. Discussed strict return precautions to the ED to include signs of infection. Patient expressed understanding the discharge instructions.  Pt case discussed and pt seen by Dr. Rhunette Croft who agrees with the above plan.   I personally performed the services described in this documentation, which was scribed in my presence. The recorded information has been reviewed and is accurate.    Final Clinical Impressions(s) /  ED Diagnoses   Final diagnoses:  Mechanical complication of suprapubic catheter, initial encounter [facility])    New Prescriptions New Prescriptions   No medications on file     Jerre Simon, [place] 03/07/16 0126    Derwood Kaplan, MD 03/12/16 0045

## 2016-03-06 NOTE — ED Triage Notes (Signed)
Pt coming from home with home health Aide that during evening bath noticed super pubic catheter balloon and tip extending from vaginal opening. Super pubic cath was inserted today by [facility] per pt report.

## 2016-03-06 NOTE — Discharge Instructions (Addendum)
Xray showed that the catheter we inserted is in the correct place. Follow up with your primary care provider tomorrow to have your catheter replaced. Return to the emergency department if you experience fever, abdominal pain, nausea, vomiting, or any other concerning symptoms.

## 2016-03-07 NOTE — ED Notes (Signed)
[facility] called for transport discharge paper reviewed with pt and caregiver at bedside

## 2016-09-05 ENCOUNTER — Encounter (HOSPITAL_BASED_OUTPATIENT_CLINIC_OR_DEPARTMENT_OTHER): Payer: Self-pay | Admitting: Emergency Medicine

## 2016-09-05 ENCOUNTER — Emergency Department (HOSPITAL_BASED_OUTPATIENT_CLINIC_OR_DEPARTMENT_OTHER)
Admission: EM | Admit: 2016-09-05 | Discharge: 2016-09-05 | Disposition: A | Discharge status: Home / Self Care | Payer: Medicare Other | Attending: Physician Assistant | Admitting: Physician Assistant

## 2016-09-05 DIAGNOSIS — T83098A Other mechanical complication of other indwelling urethral catheter, initial encounter: Secondary | ICD-10-CM | POA: Diagnosis not present

## 2016-09-05 DIAGNOSIS — E119 Type 2 diabetes mellitus without complications: Secondary | ICD-10-CM | POA: Diagnosis not present

## 2016-09-05 DIAGNOSIS — Z7982 Long term (current) use of aspirin: Secondary | ICD-10-CM | POA: Diagnosis not present

## 2016-09-05 DIAGNOSIS — T839XXA Unspecified complication of genitourinary prosthetic device, implant and graft, initial encounter: Secondary | ICD-10-CM

## 2016-09-05 DIAGNOSIS — E039 Hypothyroidism, unspecified: Secondary | ICD-10-CM | POA: Diagnosis not present

## 2016-09-05 DIAGNOSIS — Y732 Prosthetic and other implants, materials and accessory gastroenterology and urology devices associated with adverse incidents: Secondary | ICD-10-CM | POA: Insufficient documentation

## 2016-09-05 NOTE — Discharge Instructions (Signed)
Return as needed

## 2016-09-05 NOTE — ED Notes (Signed)
Contacted [facility] for transport for patient back to her home

## 2016-09-05 NOTE — ED Notes (Signed)
MD at bedside.  Tip of urinary catheter found to be extending out of urethra and was inflated with 25ml fluid.  Balloon deflated by EDP and pulled back through suprapubic opening in abd wall.   New 18Fr catheter inserted into bladder through suprapubic opening by EDP and balloon inflated with 43ml NS.  Pts diaper changed by staff. Pt positioned for comfort.  Procedure tolerated well.

## 2016-09-05 NOTE — ED Provider Notes (Signed)
[facility]   CSN: [id number] Arrival date & time: 09/05/16  1957  By signing my name below, I, [name], attest that this documentation has been prepared under the direction and in the presence of Mishti Swanton Randall An, MD. Electronically Signed: Modena Stanley, Scribe. 09/05/2016. 8:34 PM.  History   Chief Complaint Chief Complaint  Patient presents with  . Urinary Retention   The history is provided by the patient and the nursing home. No language interpreter was used.   HPI Comments: [name] is a 66 y.o. female who presents to the Emergency Department complaining of urinary retention that started this morning. She states her suprapubic catheter was inserted too far during changing by her home health nurse. She had associated abdominal pain at the time so they called EMS. She admits to prior hx of similar complaint. No modifying factors. She denies any other complaints.     PCP: [name], NP  Past Medical History:  Diagnosis Date  . Anxiety   . Arthritis    old falls-with injuries to rt knee and both ankles and rt wrist--states she has some pain in these areas-probable arthritis  . Arthritis    "hands" (09/03/2013)     Hoyer Lift used for transrers  . Bedridden 09/02/2013  . Blindness of right eye    complication of ms  . Cellulitis    hx of cellulitis in lower extremities -requiring multiple hospitalzations in the past--no problems in las 2 yrs  . GERD (gastroesophageal reflux disease)    nexium takes care  . High cholesterol   . Hypothyroidism   . Multiple sclerosis (HCC)    pt is bedridden -does get up to recliner by hoyer lift; mentally alert, able to feed herself -no trouble swallowing. lives at home with husband and has 24 hour caregivers.  . Neuromuscular disorder (HCC)    ms-not ambulatory-muscle spasms-and recent tremors left arm and left leg have developed Dr.--. Daphane Shepherd in [place] is pt's neurologist  . Thyroid disease    . Type II diabetes mellitus (HCC)    diet control - no longer takes any diabetic meds  . Urinary incontinence    neurogenic bladder--hx of uti's  . UTI (lower urinary tract infection)     Patient Active Problem List   Diagnosis Date Noted  . AKI (acute kidney injury) (HCC) 02/18/2014  . SBO (small bowel obstruction) 02/17/2014  . Leukocytosis 02/17/2014  . Dyslipidemia 02/17/2014  . Neuropathic pain 02/17/2014  . Sacral decubitus ulcer, stage II 09/02/2013  . Bedridden 09/02/2013  . UTI (urinary tract infection) 09/01/2013  . DM type 2 (diabetes mellitus, type 2) (HCC) 09/01/2013  . Multiple sclerosis (HCC)   . GERD (gastroesophageal reflux disease)   . Hypothyroidism   . Blindness of right eye     Past Surgical History:  Procedure Laterality Date  . ADENOIDECTOMY    . CHOLECYSTECTOMY    . CYSTOSCOPY  07/02/2011   Procedure: CYSTOSCOPY;  Surgeon: Kathi Ludwig, MD;  Location: [facility];  Service: Urology;  Laterality: N/A;  . FRACTURE SURGERY     Left Ankle  . INSERTION OF SUPRAPUBIC CATHETER N/A 01/15/2014   Procedure: CYSTOSCOPY WITH BOTOX INJECTION/SUPRAPUBIC TUBE PLACEMENT;  Surgeon: Kathi Ludwig, MD;  Location: [facility];  Service: Urology;  Laterality: N/A;  . INSERTION OF SUPRAPUBIC CATHETER N/A 04/09/2014   Procedure: SUPRAPUBIC TUBE PLACEMENT ;  Surgeon: Kathi Ludwig, MD;  Location: [facility];  Service: Urology;  Laterality:  N/A;  . INSERTION OF SUPRAPUBIC CATHETER N/A 08/19/2014   Procedure: PLACEMENT OF SUPRAPUBIC TUBE ,cystoscopy, control of bleeding;  Surgeon: Kathi Ludwig, MD;  Location: [facility];  Service: Urology;  Laterality: N/A;  . LYMPH GLAND EXCISION    . MEDIASTINOSCOPY  04/24/10   for enlarged right paratracheal lymph node--surgery at [facility]-Dr. Burney--pt states she does not know the  what the pathology report showed.  . ORIF ANKLE FRACTURE Left 2005  . TONSILLECTOMY    . WRIST SURGERY Right     OB History    No data available        Home Medications    Prior to Admission medications   Medication Sig Start Date End Date Taking? Authorizing Provider  aspirin EC 81 MG tablet Take 81 mg by mouth daily.    Historical Provider, MD  atorvastatin (LIPITOR) 20 MG tablet Take 20 mg by mouth at bedtime.    Historical Provider, MD  CRANBERRY PO Take 1 tablet by mouth daily as needed (for urinary pain).     Historical Provider, MD  cyclobenzaprine (FLEXERIL) 5 MG tablet Take 5-10 mg by mouth See admin instructions. Pt takes 5 mg three times daily and then 10 mg nightly at bedtime.    Historical Provider, MD  darifenacin (ENABLEX) 7.5 MG 24 hr tablet Take 7.5 mg by mouth 2 (two) times daily.    Historical Provider, MD  diphenhydrAMINE (BENADRYL) 25 MG tablet Take 50 mg by mouth every 6 (six) hours as needed for itching.    Historical Provider, MD  docusate sodium (COLACE) 100 MG capsule Take 100 mg by mouth daily as needed for mild constipation.    Historical Provider, MD  esomeprazole (NEXIUM) 40 MG capsule Take 40 mg by mouth every morning.     Historical Provider, MD  furosemide (LASIX) 20 MG tablet Take 20 mg by mouth 2 (two) times daily.    Historical Provider, MD  gabapentin (NEURONTIN) 400 MG capsule Take 800 mg by mouth 4 (four) times daily.     Historical Provider, MD  levothyroxine (SYNTHROID) 125 MCG tablet Take 1 tablet (125 mcg total) by mouth daily before breakfast. 05/11/13   Jeralyn Bennett, MD  liver oil-zinc oxide (DESITIN) 40 % ointment Apply 1 application topically as needed for irritation.    Historical Provider, MD  meclizine (ANTIVERT) 25 MG tablet Take 25 mg by mouth 3 (three) times daily as needed for dizziness.    Historical Provider, MD  mineral oil enema Place 1 enema rectally once.    Historical Provider, MD  mineral oil-hydrophilic petrolatum (AQUAPHOR) ointment Apply 1 application topically as needed for dry skin.    Historical Provider, MD  Multiple Vitamin (MULTIVITAMIN WITH MINERALS) TABS tablet  Take 1 tablet by mouth daily.    Historical Provider, MD  nitrofurantoin (MACRODANTIN) 50 MG capsule Take 50 mg by mouth daily.    Historical Provider, MD  nystatin (MYCOSTATIN/NYSTOP) 100000 UNIT/GM POWD Apply 1 Bottle topically 2 (two) times daily. To perineal/groin area and right great toe as needed for yeast    Historical Provider, MD  nystatin cream (MYCOSTATIN) Apply 1 application topically as needed for dry skin.    Historical Provider, MD  oxyCODONE-acetaminophen (ROXICET) 5-325 MG per tablet Take 1 tablet by mouth every 4 (four) hours as needed for severe pain. 08/19/14   Jethro Bolus, MD  simethicone (GAS-X EXTRA STRENGTH) 125 MG chewable tablet Chew 125 mg by mouth every 6 (six) hours as needed for flatulence.  Historical Provider, MD  sulfamethoxazole-trimethoprim (BACTRIM DS,SEPTRA DS) 800-160 MG per tablet Take 1 tablet by mouth 2 (two) times daily. For 7 days.    Historical Provider, MD  traMADol-acetaminophen (ULTRACET) 37.5-325 MG per tablet Take 1 tablet by mouth every 6 (six) hours as needed. Patient not taking: Reported on 08/19/2014 04/09/14   Jethro Bolus, MD  trolamine salicylate (ASPERCREME) 10 % cream Apply 1 application topically as needed for muscle pain.    Historical Provider, MD    Family History No family history on file.  Social History Social History  Substance Use Topics  . Smoking status: Never Smoker  . Smokeless tobacco: Never Used  . Alcohol use No     Allergies   Avelox [moxifloxacin hcl in nacl]; Ciprofloxacin hcl; Penicillins; and Shrimp [shellfish allergy]   Review of Systems Review of Systems  Gastrointestinal: Positive for abdominal pain.  Genitourinary: Positive for difficulty urinating.  All other systems reviewed and are negative.    Physical Exam Updated Vital Signs BP 148/87 (BP Location: Right Wrist)   Pulse 97   Temp 98.2 F (36.8 C) (Oral)   Resp 20   Ht 5\' 2"  (1.575 m)   SpO2 99%   Physical Exam    Constitutional: She appears well-developed and well-nourished. No distress.  HENT:  Head: Normocephalic.  Eyes: Conjunctivae are normal.  Neck: Neck supple.  Cardiovascular: Normal rate and regular rhythm.   Pulmonary/Chest: Effort normal.  Abdominal: Soft.  Suprapubic catheter.   Musculoskeletal: Normal range of motion.  Neurological: She is alert.  Skin: Skin is warm and dry.  Psychiatric: She has a normal mood and affect.  Nursing note and vitals reviewed.    ED Treatments / Results  DIAGNOSTIC STUDIES: Oxygen Saturation is 99% on RA, normal by my interpretation.    COORDINATION OF CARE: 8:39 PM- Pt advised of plan for treatment and pt agrees.  Labs (all labs ordered are listed, but only abnormal results are displayed) Labs Reviewed - No data to display  EKG  EKG Interpretation None       Radiology No results found.  Procedures BLADDER CATHETERIZATION Date/Time: 09/05/2016 9:16 PM Performed by: Bary Castilla LYN Authorized by: Bary Castilla LYN   Consent:    Consent obtained:  Verbal   Consent given by:  Patient   Risks discussed:  False passage, incomplete procedure and urethral injury   Alternatives discussed:  No treatment Pre-procedure details:    Procedure purpose:  Therapeutic Anesthesia (see MAR for exact dosages):    Anesthesia method:  None Procedure details:    Provider performed due to:  Nurse unable to complete   Catheter insertion:  Indwelling   Catheter type:  Foley   Catheter size:  18 Fr   Bladder irrigation: yes     Number of attempts:  2   Urine characteristics:  Bloody and clear Post-procedure details:    Patient tolerance of procedure:  Tolerated well, no immediate complications   (including critical care time)  Medications Ordered in ED Medications - No data to display   Initial Impression / Assessment and Plan / ED Course  I have reviewed the triage vital signs and the nursing notes.  Pertinent labs & imaging  results that were available during my care of the patient were reviewed by me and considered in my medical decision making (see chart for details).    I personally performed the services described in this documentation, which was scribed in my presence. The recorded information has been  reviewed and is accurate.   Patient is a 66 year old female with MS and suprapubic catheter. Patient's presenting because her nurse came to fix her catheter and unfortunately place it too far and reports that the bulb of the catheter was coming out her vaginal space.   9:14 PM The suprapubic catheter indeed is coming out of the urethra. We will deflate the balloon and movement into the bladder space and then fill it again.  This is done successfully. Placing urine in the bladder causes it to leak out which is baseline for patient.  .  Final Clinical Impressions(s) / ED Diagnoses   Final diagnoses:  None    New Prescriptions New Prescriptions   No medications on file     Jonelle Bann Randall An, MD 09/05/16 2116

## 2016-09-05 NOTE — ED Triage Notes (Signed)
11am today Home Health nurse replaced Suprapubic catheter and there has been no output. Pt from home. Arrived via EMS.

## 2017-12-07 ENCOUNTER — Encounter (HOSPITAL_COMMUNITY): Payer: Self-pay | Admitting: Emergency Medicine

## 2017-12-07 ENCOUNTER — Inpatient Hospital Stay (HOSPITAL_COMMUNITY)
Admission: EM | Admit: 2017-12-07 | Discharge: 2017-12-11 | DRG: 208 | Disposition: A | Discharge status: Hospice | Payer: Medicare Other | Attending: Internal Medicine | Admitting: Internal Medicine

## 2017-12-07 ENCOUNTER — Emergency Department (HOSPITAL_COMMUNITY): Payer: Medicare Other | Admitting: Certified Registered Nurse Anesthetist

## 2017-12-07 ENCOUNTER — Inpatient Hospital Stay (HOSPITAL_COMMUNITY): Payer: Medicare Other

## 2017-12-07 ENCOUNTER — Other Ambulatory Visit: Payer: Self-pay

## 2017-12-07 ENCOUNTER — Emergency Department (HOSPITAL_COMMUNITY): Payer: Medicare Other

## 2017-12-07 ENCOUNTER — Encounter (HOSPITAL_COMMUNITY)
Admission: EM | Disposition: A | Discharge status: Hospice | Payer: Self-pay | Source: Home / Self Care | Attending: Internal Medicine

## 2017-12-07 DIAGNOSIS — Z7401 Bed confinement status: Secondary | ICD-10-CM

## 2017-12-07 DIAGNOSIS — L89152 Pressure ulcer of sacral region, stage 2: Secondary | ICD-10-CM | POA: Diagnosis present

## 2017-12-07 DIAGNOSIS — Z79890 Hormone replacement therapy (postmenopausal): Secondary | ICD-10-CM | POA: Diagnosis not present

## 2017-12-07 DIAGNOSIS — J690 Pneumonitis due to inhalation of food and vomit: Principal | ICD-10-CM | POA: Diagnosis present

## 2017-12-07 DIAGNOSIS — E119 Type 2 diabetes mellitus without complications: Secondary | ICD-10-CM | POA: Diagnosis present

## 2017-12-07 DIAGNOSIS — T83518A Infection and inflammatory reaction due to other urinary catheter, initial encounter: Secondary | ICD-10-CM | POA: Diagnosis present

## 2017-12-07 DIAGNOSIS — Z79899 Other long term (current) drug therapy: Secondary | ICD-10-CM

## 2017-12-07 DIAGNOSIS — K222 Esophageal obstruction: Secondary | ICD-10-CM | POA: Diagnosis present

## 2017-12-07 DIAGNOSIS — Z9359 Other cystostomy status: Secondary | ICD-10-CM | POA: Diagnosis not present

## 2017-12-07 DIAGNOSIS — E039 Hypothyroidism, unspecified: Secondary | ICD-10-CM | POA: Diagnosis present

## 2017-12-07 DIAGNOSIS — M19041 Primary osteoarthritis, right hand: Secondary | ICD-10-CM | POA: Diagnosis present

## 2017-12-07 DIAGNOSIS — J9601 Acute respiratory failure with hypoxia: Secondary | ICD-10-CM | POA: Diagnosis present

## 2017-12-07 DIAGNOSIS — E079 Disorder of thyroid, unspecified: Secondary | ICD-10-CM | POA: Diagnosis present

## 2017-12-07 DIAGNOSIS — Z7982 Long term (current) use of aspirin: Secondary | ICD-10-CM

## 2017-12-07 DIAGNOSIS — T17908A Unspecified foreign body in respiratory tract, part unspecified causing other injury, initial encounter: Secondary | ICD-10-CM

## 2017-12-07 DIAGNOSIS — E876 Hypokalemia: Secondary | ICD-10-CM | POA: Diagnosis present

## 2017-12-07 DIAGNOSIS — K117 Disturbances of salivary secretion: Secondary | ICD-10-CM | POA: Diagnosis present

## 2017-12-07 DIAGNOSIS — Z881 Allergy status to other antibiotic agents status: Secondary | ICD-10-CM

## 2017-12-07 DIAGNOSIS — K219 Gastro-esophageal reflux disease without esophagitis: Secondary | ICD-10-CM | POA: Diagnosis present

## 2017-12-07 DIAGNOSIS — G35 Multiple sclerosis: Secondary | ICD-10-CM | POA: Diagnosis present

## 2017-12-07 DIAGNOSIS — R0602 Shortness of breath: Secondary | ICD-10-CM

## 2017-12-07 DIAGNOSIS — M19042 Primary osteoarthritis, left hand: Secondary | ICD-10-CM | POA: Diagnosis present

## 2017-12-07 DIAGNOSIS — Z794 Long term (current) use of insulin: Secondary | ICD-10-CM

## 2017-12-07 DIAGNOSIS — T17308A Unspecified foreign body in larynx causing other injury, initial encounter: Secondary | ICD-10-CM

## 2017-12-07 DIAGNOSIS — Z91013 Allergy to seafood: Secondary | ICD-10-CM | POA: Diagnosis not present

## 2017-12-07 DIAGNOSIS — T18108A Unspecified foreign body in esophagus causing other injury, initial encounter: Secondary | ICD-10-CM | POA: Diagnosis present

## 2017-12-07 DIAGNOSIS — Z880 Allergy status to penicillin: Secondary | ICD-10-CM

## 2017-12-07 DIAGNOSIS — T17308D Unspecified foreign body in larynx causing other injury, subsequent encounter: Secondary | ICD-10-CM | POA: Diagnosis not present

## 2017-12-07 DIAGNOSIS — Z978 Presence of other specified devices: Secondary | ICD-10-CM

## 2017-12-07 DIAGNOSIS — Z6830 Body mass index (BMI) 30.0-30.9, adult: Secondary | ICD-10-CM

## 2017-12-07 HISTORY — PX: ESOPHAGOGASTRODUODENOSCOPY: SHX5428

## 2017-12-07 HISTORY — PX: FOREIGN BODY REMOVAL: SHX962

## 2017-12-07 LAB — GLUCOSE, CAPILLARY: GLUCOSE-CAPILLARY: 104 mg/dL — AB (ref 65–99)

## 2017-12-07 SURGERY — EGD (ESOPHAGOGASTRODUODENOSCOPY)
Anesthesia: General

## 2017-12-07 MED ORDER — ONDANSETRON HCL 4 MG/2ML IJ SOLN: 4.0000 mg | Freq: Four times a day (QID) | INTRAMUSCULAR | Status: DC | PRN

## 2017-12-07 MED ORDER — FENTANYL BOLUS VIA INFUSION
25.0000 ug | INTRAVENOUS | Status: DC | PRN
  Filled 2017-12-07: qty 25

## 2017-12-07 MED ORDER — ONDANSETRON HCL 4 MG/2ML IJ SOLN
INTRAMUSCULAR | Status: DC | PRN
  Administered 2017-12-07: 4 mg via INTRAVENOUS

## 2017-12-07 MED ORDER — FENTANYL CITRATE (PF) 100 MCG/2ML IJ SOLN
INTRAMUSCULAR | Status: AC
  Filled 2017-12-07: qty 2

## 2017-12-07 MED ORDER — MECLIZINE HCL 25 MG PO TABS
25.0000 mg | ORAL_TABLET | Freq: Three times a day (TID) | ORAL | Status: DC | PRN
  Filled 2017-12-07: qty 1

## 2017-12-07 MED ORDER — DIPHENHYDRAMINE HCL 25 MG PO CAPS: 50.0000 mg | ORAL_CAPSULE | Freq: Four times a day (QID) | ORAL | Status: DC | PRN

## 2017-12-07 MED ORDER — MIDAZOLAM HCL 2 MG/2ML IJ SOLN: 1.0000 mg | INTRAMUSCULAR | Status: DC | PRN

## 2017-12-07 MED ORDER — NYSTATIN 100000 UNIT/GM EX POWD
1.0000 | Freq: Two times a day (BID) | CUTANEOUS | Status: DC
  Administered 2017-12-08 – 2017-12-11 (×8): 1 via TOPICAL
  Filled 2017-12-07 (×2): qty 15

## 2017-12-07 MED ORDER — FENTANYL CITRATE (PF) 100 MCG/2ML IJ SOLN
INTRAMUSCULAR | Status: AC
  Administered 2017-12-07: 50 ug via INTRAVENOUS
  Filled 2017-12-07: qty 2

## 2017-12-07 MED ORDER — CYCLOBENZAPRINE HCL 5 MG PO TABS
5.0000 mg | ORAL_TABLET | Freq: Three times a day (TID) | ORAL | Status: DC
  Administered 2017-12-08 – 2017-12-11 (×10): 5 mg via ORAL
  Filled 2017-12-07 (×10): qty 1

## 2017-12-07 MED ORDER — PHENYLEPHRINE 40 MCG/ML (10ML) SYRINGE FOR IV PUSH (FOR BLOOD PRESSURE SUPPORT)
PREFILLED_SYRINGE | INTRAVENOUS | Status: DC | PRN
  Administered 2017-12-07 (×4): 80 ug via INTRAVENOUS

## 2017-12-07 MED ORDER — PROPOFOL 10 MG/ML IV BOLUS
INTRAVENOUS | Status: DC | PRN
  Administered 2017-12-07: 40 mg via INTRAVENOUS
  Administered 2017-12-07: 80 mg via INTRAVENOUS

## 2017-12-07 MED ORDER — FENTANYL 2500MCG IN NS 250ML (10MCG/ML) PREMIX INFUSION
25.0000 ug/h | INTRAVENOUS | Status: DC
  Administered 2017-12-08 (×2): 50 ug/h via INTRAVENOUS
  Filled 2017-12-07: qty 250

## 2017-12-07 MED ORDER — ENOXAPARIN SODIUM 40 MG/0.4ML ~~LOC~~ SOLN
40.0000 mg | Freq: Every day | SUBCUTANEOUS | Status: DC
  Administered 2017-12-08 – 2017-12-10 (×4): 40 mg via SUBCUTANEOUS
  Filled 2017-12-07 (×4): qty 0.4

## 2017-12-07 MED ORDER — PROPOFOL 10 MG/ML IV BOLUS
INTRAVENOUS | Status: AC
  Filled 2017-12-07: qty 20

## 2017-12-07 MED ORDER — ONDANSETRON HCL 4 MG PO TABS: 4.0000 mg | ORAL_TABLET | Freq: Four times a day (QID) | ORAL | Status: DC | PRN

## 2017-12-07 MED ORDER — MIDAZOLAM HCL 5 MG/5ML IJ SOLN
INTRAMUSCULAR | Status: DC | PRN
  Administered 2017-12-07: 2 mg via INTRAVENOUS

## 2017-12-07 MED ORDER — LACTATED RINGERS IV SOLN
INTRAVENOUS | Status: DC
  Administered 2017-12-07: 22:00:00 via INTRAVENOUS

## 2017-12-07 MED ORDER — GABAPENTIN 400 MG PO CAPS
800.0000 mg | ORAL_CAPSULE | Freq: Four times a day (QID) | ORAL | Status: DC
  Administered 2017-12-08 – 2017-12-11 (×12): 800 mg via ORAL
  Filled 2017-12-07 (×13): qty 2

## 2017-12-07 MED ORDER — NYSTATIN 100000 UNIT/GM EX CREA
1.0000 "application " | TOPICAL_CREAM | CUTANEOUS | Status: DC | PRN
  Filled 2017-12-07: qty 15

## 2017-12-07 MED ORDER — CHLORHEXIDINE GLUCONATE 0.12% ORAL RINSE (MEDLINE KIT)
15.0000 mL | Freq: Two times a day (BID) | OROMUCOSAL | Status: DC
  Administered 2017-12-08 (×2): 15 mL via OROMUCOSAL

## 2017-12-07 MED ORDER — SIMETHICONE 80 MG PO CHEW: 80.0000 mg | CHEWABLE_TABLET | Freq: Four times a day (QID) | ORAL | Status: DC | PRN

## 2017-12-07 MED ORDER — FENTANYL CITRATE (PF) 100 MCG/2ML IJ SOLN
50.0000 ug | Freq: Once | INTRAMUSCULAR | Status: AC
  Administered 2017-12-07: 50 ug via INTRAVENOUS

## 2017-12-07 MED ORDER — ORAL CARE MOUTH RINSE
15.0000 mL | OROMUCOSAL | Status: DC
  Administered 2017-12-08 (×3): 15 mL via OROMUCOSAL

## 2017-12-07 MED ORDER — MIDAZOLAM HCL 2 MG/2ML IJ SOLN
INTRAMUSCULAR | Status: AC
  Filled 2017-12-07: qty 2

## 2017-12-07 MED ORDER — LIDOCAINE 2% (20 MG/ML) 5 ML SYRINGE
INTRAMUSCULAR | Status: DC | PRN
  Administered 2017-12-07: 60 mg via INTRAVENOUS

## 2017-12-07 MED ORDER — DOCUSATE SODIUM 100 MG PO CAPS: 100.0000 mg | ORAL_CAPSULE | Freq: Every day | ORAL | Status: DC | PRN

## 2017-12-07 MED ORDER — TIZANIDINE HCL 4 MG PO TABS: 4.0000 mg | ORAL_TABLET | Freq: Two times a day (BID) | ORAL | Status: DC

## 2017-12-07 MED ORDER — FAMOTIDINE IN NACL 20-0.9 MG/50ML-% IV SOLN
20.0000 mg | Freq: Two times a day (BID) | INTRAVENOUS | Status: DC
  Administered 2017-12-08 – 2017-12-09 (×4): 20 mg via INTRAVENOUS
  Filled 2017-12-07 (×4): qty 50

## 2017-12-07 MED ORDER — FENTANYL CITRATE (PF) 100 MCG/2ML IJ SOLN
25.0000 ug | INTRAMUSCULAR | Status: DC | PRN
  Administered 2017-12-07: 50 ug via INTRAVENOUS

## 2017-12-07 NOTE — ED Notes (Signed)
Pt transported to OR via stretcher  

## 2017-12-07 NOTE — Consult Note (Signed)
[facility] Gastroenterology Consultation Note  Referring Provider: Dr. Rush Landmark Primary Care Physician:  [name], NP  Reason for Consultation:  Suspected esophageal food impaction  HPI: [name] is a 67 y.o. female bedbound from severe multiple sclerosis.  Had choking episode a few hours ago, turned blue, had abdominal thrust maneuver performed.  Post maneuver, persistent sensation of food (spaghetti and beans) stuck in throat with sialorrhea.  Denies prior history of dysphagia.  No chest pain, abdominal pain, blood in stool.   Past Medical History:  Diagnosis Date  . Anxiety   . Arthritis    old falls-with injuries to rt knee and both ankles and rt wrist--states she has some pain in these areas-probable arthritis  . Arthritis    "hands" (09/03/2013)     Hoyer Lift used for transrers  . Bedridden 09/02/2013  . Blindness of right eye    complication of ms  . Cellulitis    hx of cellulitis in lower extremities -requiring multiple hospitalzations in the past--no problems in las 2 yrs  . GERD (gastroesophageal reflux disease)    nexium takes care  . High cholesterol   . Hypothyroidism   . Multiple sclerosis (HCC)    pt is bedridden -does get up to recliner by hoyer lift; mentally alert, able to feed herself -no trouble swallowing. lives at home with husband and has 24 hour caregivers.  . Neuromuscular disorder (HCC)    ms-not ambulatory-muscle spasms-and recent tremors left arm and left leg have developed Dr.--. Daphane Shepherd in [place] is pt's neurologist  . Thyroid disease   . Type II diabetes mellitus (HCC)    diet control - no longer takes any diabetic meds  . Urinary incontinence    neurogenic bladder--hx of uti's  . UTI (lower urinary tract infection)     Past Surgical History:  Procedure Laterality Date  . ADENOIDECTOMY    . CHOLECYSTECTOMY    . CYSTOSCOPY  07/02/2011   Procedure: CYSTOSCOPY;  Surgeon: Kathi Ludwig, MD;  Location: [facility];  Service: Urology;   Laterality: N/A;  . FRACTURE SURGERY     Left Ankle  . INSERTION OF SUPRAPUBIC CATHETER N/A 01/15/2014   Procedure: CYSTOSCOPY WITH BOTOX INJECTION/SUPRAPUBIC TUBE PLACEMENT;  Surgeon: Kathi Ludwig, MD;  Location: [facility];  Service: Urology;  Laterality: N/A;  . INSERTION OF SUPRAPUBIC CATHETER N/A 04/09/2014   Procedure: SUPRAPUBIC TUBE PLACEMENT ;  Surgeon: Kathi Ludwig, MD;  Location: [facility];  Service: Urology;  Laterality: N/A;  . INSERTION OF SUPRAPUBIC CATHETER N/A 08/19/2014   Procedure: PLACEMENT OF SUPRAPUBIC TUBE ,cystoscopy, control of bleeding;  Surgeon: Kathi Ludwig, MD;  Location: [facility];  Service: Urology;  Laterality: N/A;  . LYMPH GLAND EXCISION    . MEDIASTINOSCOPY  04/24/10   for enlarged right paratracheal lymph node--surgery at [facility]-Dr. Burney--pt states she does not know the  what the pathology report showed.  . ORIF ANKLE FRACTURE Left 2005  . TONSILLECTOMY    . WRIST SURGERY Right     Prior to Admission medications   Medication Sig Start Date End Date Taking? Authorizing Provider  cyclobenzaprine (FLEXERIL) 5 MG tablet Take 5-10 mg by mouth See admin instructions. Pt takes 5 mg three times daily and then 10 mg nightly at bedtime.   Yes [provider]  diphenhydrAMINE (BENADRYL) 25 MG tablet Take 50 mg by mouth every 6 (six) hours as needed for itching.   Yes [provider]  docusate sodium (COLACE) 100 MG  capsule Take 100 mg by mouth daily as needed for mild constipation.   Yes [provider]  gabapentin (NEURONTIN) 400 MG capsule Take 800 mg by mouth 4 (four) times daily.    Yes [provider]  meclizine (ANTIVERT) 25 MG tablet Take 25 mg by mouth 3 (three) times daily as needed for dizziness.   Yes [provider]  nystatin (MYCOSTATIN/NYSTOP) 100000 UNIT/GM POWD Apply 1 Bottle topically 2 (two) times daily. To perineal/groin area and right great toe as needed for yeast   Yes [provider]  nystatin cream (MYCOSTATIN) Apply 1 application topically as needed for dry skin.   Yes [provider]  simethicone (GAS-X EXTRA STRENGTH) 125 MG chewable tablet Chew 125 mg by mouth every 6 (six) hours as needed for flatulence.   Yes [provider]  tiZANidine (ZANAFLEX) 4 MG tablet Take 4 mg by mouth 2 (two) times daily. 10/03/17  Yes [provider]  levothyroxine (SYNTHROID) 125 MCG tablet Take 1 tablet (125 mcg total) by mouth daily before breakfast. Patient not taking: Reported on 12/07/2017 05/11/13   Jeralyn Bennett, MD  oxyCODONE-acetaminophen (ROXICET) 5-325 MG per tablet Take 1 tablet by mouth every 4 (four) hours as needed for severe pain. Patient not taking: Reported on 12/07/2017 08/19/14   Jethro Bolus, MD  traMADol-acetaminophen (ULTRACET) 37.5-325 MG per tablet Take 1 tablet by mouth every 6 (six) hours as needed. Patient not taking: Reported on 08/19/2014 04/09/14   Jethro Bolus, MD    No current facility-administered medications for this encounter.    Current Outpatient Medications  Medication Sig Dispense Refill  . cyclobenzaprine (FLEXERIL) 5 MG tablet Take 5-10 mg by mouth See admin instructions. Pt takes 5 mg three times daily and then 10 mg nightly at bedtime.    . diphenhydrAMINE (BENADRYL) 25 MG tablet Take 50 mg by mouth every 6 (six) hours as needed for itching.    . docusate sodium (COLACE) 100 MG capsule Take 100 mg by mouth daily as needed for mild constipation.    . gabapentin (NEURONTIN) 400 MG capsule Take 800 mg by mouth 4 (four) times daily.     . meclizine (ANTIVERT) 25 MG tablet Take 25 mg by mouth 3 (three) times daily as needed for dizziness.    . nystatin (MYCOSTATIN/NYSTOP) 100000 UNIT/GM POWD Apply 1 Bottle topically 2 (two) times daily. To perineal/groin area and right great toe as needed for yeast    . nystatin cream (MYCOSTATIN) Apply 1 application topically as needed for dry skin.    . simethicone (GAS-X  EXTRA STRENGTH) 125 MG chewable tablet Chew 125 mg by mouth every 6 (six) hours as needed for flatulence.    Marland Kitchen tiZANidine (ZANAFLEX) 4 MG tablet Take 4 mg by mouth 2 (two) times daily.  2  . levothyroxine (SYNTHROID) 125 MCG tablet Take 1 tablet (125 mcg total) by mouth daily before breakfast. (Patient not taking: Reported on 12/07/2017) 30 tablet 0  . oxyCODONE-acetaminophen (ROXICET) 5-325 MG per tablet Take 1 tablet by mouth every 4 (four) hours as needed for severe pain. (Patient not taking: Reported on 12/07/2017) 30 tablet 0  . traMADol-acetaminophen (ULTRACET) 37.5-325 MG per tablet Take 1 tablet by mouth every 6 (six) hours as needed. (Patient not taking: Reported on 08/19/2014) 30 tablet 0    Allergies as of 12/07/2017 - Review Complete 12/07/2017  Allergen Reaction Noted  . Avelox [moxifloxacin hcl in nacl] Hives and Other (See Comments) 02/10/2011  . Ciprofloxacin hcl Hives  and Other (See Comments) 02/10/2011  . Penicillins Hives 02/10/2011  . Shrimp [shellfish allergy] Other (See Comments) 02/10/2011    History reviewed. No pertinent family history.  Social History   Socioeconomic History  . Marital status: Married    Spouse name: Not on file  . Number of children: Not on file  . Years of education: Not on file  . Highest education level: Not on file  Occupational History  . Not on file  Social Needs  . Financial resource strain: Not on file  . Food insecurity:    Worry: Not on file    Inability: Not on file  . Transportation needs:    Medical: Not on file    Non-medical: Not on file  Tobacco Use  . Smoking status: Never Smoker  . Smokeless tobacco: Never Used  Substance and Sexual Activity  . Alcohol use: No  . Drug use: No  . Sexual activity: Not Currently    Birth control/protection: Post-menopausal  Lifestyle  . Physical activity:    Days per week: Not on file    Minutes per session: Not on file  . Stress: Not on file  Relationships  . Social connections:     Talks on phone: Not on file    Gets together: Not on file    Attends religious service: Not on file    Active member of club or organization: Not on file    Attends meetings of clubs or organizations: Not on file    Relationship status: Not on file  . Intimate partner violence:    Fear of current or ex partner: Not on file    Emotionally abused: Not on file    Physically abused: Not on file    Forced sexual activity: Not on file  Other Topics Concern  . Not on file  Social History Narrative  . Not on file    Review of Systems: As per HPI all others negative  Physical Exam: Vital signs in last 24 hours: Pulse Rate:  [102-106] 104 (06/08 2102) Resp:  [18-21] 21 (06/08 2102) BP: (145-151)/(74-95) 145/92 (06/08 2102) SpO2:  [82 %-97 %] 94 % (06/08 2111) Weight:  [74.8 kg (165 lb)] 74.8 kg (165 lb) (06/08 1947)   General:   Alert, overweight, chronically ill-appearing, tachypneic-appearing Head:  Normocephalic and atraumatic. Eyes:  Sclera clear, no icterus.   Conjunctiva pink. Ears:  Normal auditory acuity. Nose:  No deformity, discharge,  or lesions. Mouth:  No deformity or lesions.  Oropharynx pink & moist. Neck:  Supple; no masses or thyromegaly. Lungs:  Mild respiratory distress; coarse ronchi throughout all lung fields Heart:  Regular rate and rhythm; no murmurs, clicks, rubs,  or gallops. Abdomen:  Soft, protuberant, nontender and nondistended. No masses, hepatosplenomegaly or hernias noted. Normal bowel sounds, without guarding, and without rebound.     Msk:  Diffusely atrophic but aymmetrical without gross deformities. Normal posture. Pulses:  Normal pulses noted. Extremities:  Contractured, Without clubbing or edema. Neurologic:  Alert and  oriented x4;  grossly normal neurologically. Skin:  Intact without significant lesions or rashes. Cervical Nodes:  No significant cervical adenopathy. Psych:  Alert and cooperative. Normal mood and affect.   Lab Results: No  results for input(s): WBC, HGB, HCT, PLT in the last 72 hours. BMET No results for input(s): NA, K, CL, CO2, GLUCOSE, BUN, CREATININE, CALCIUM in the last 72 hours. LFT No results for input(s): PROT, ALBUMIN, AST, ALT, ALKPHOS, BILITOT, BILIDIR, IBILI in the last 72  hours. PT/INR No results for input(s): LABPROT, INR in the last 72 hours.  Studies/Results: Dg Chest 2 View  Result Date: 12/07/2017 CLINICAL DATA:  Choking on food tonight. EXAM: CHEST - 2 VIEW COMPARISON:  February 20, 2014 FINDINGS: The mediastinal contour is normal. Heart size is mildly enlarged. Both lungs are clear. The visualized skeletal structures are stable. IMPRESSION: No active cardiopulmonary disease. Electronically Signed   By: Sherian Rein M.D.   On: 12/07/2017 21:16    Impression:  1.  Suspected esophageal food impaction. 2.  Hypoxia, improved with supplementation, possible aspiration. 3.  Multiple sclerosis, severe and bedridden.  Plan:  1.  Endoscopy for possible esophageal foreign body removal. 2.  Risks (bleeding, infection, bowel perforation that could require surgery, sedation-related changes in cardiopulmonary systems), benefits (identification and possible treatment of source of symptoms, exclusion of certain causes of symptoms), and alternatives (watchful waiting, radiographic imaging studies, empiric medical treatment) of upper endoscopy (EGD) were explained to patient/family in detail and patient wishes to proceed. 3.  Anesthesia will help with sedation, likely intubate during procedure. 4.  Patient will need to be admitted to [facility] post procedure for monitoring given her tentative respiratory state.   LOS: 0 days   Filmore Molyneux M  12/07/2017, 10:03 PM  Cell [phone] If no answer or after 5 PM call [phone]

## 2017-12-07 NOTE — Op Note (Signed)
[facility] Patient Name: [name] Procedure Date: 12/07/2017 MRN: [id number] Attending MD: Willis Modena , MD Date of Birth: [birth year] CSN: [id number] Age: 67 Admit Type: Emergency Department Procedure:                Upper GI endoscopy Indications:              Foreign body in the esophagus Providers:                Willis Modena, MD, Norman Clay, RN, Madalyn Rob, Technician Referring MD:             Emergency Department (Dr. Rush Landmark) Medicines:                General Anesthesia Complications:            No immediate complications. Estimated Blood Loss:     Estimated blood loss: none. Procedure:                Pre-Anesthesia Assessment:                           - Prior to the procedure, a History and Physical                            was performed, and patient medications and                            allergies were reviewed. The patient's tolerance of                            previous anesthesia was also reviewed. The risks                            and benefits of the procedure and the sedation                            options and risks were discussed with the patient.                            All questions were answered, and informed consent                            was obtained. Prior Anticoagulants: The patient has                            taken no previous anticoagulant or antiplatelet                            agents. ASA Grade Assessment: III - A patient with                            severe systemic disease. After reviewing the risks  and benefits, the patient was deemed in                            satisfactory condition to undergo the procedure.                           After obtaining informed consent, the endoscope was                            passed under direct vision. Throughout the                            procedure, the patient's blood pressure, pulse, and                        oxygen saturations were monitored continuously. The                            [id number] ([id number]) scope was introduced through the                            mouth, and advanced to the duodenal bulb. The upper                            GI endoscopy was accomplished without difficulty.                            The patient tolerated the procedure well. Scope In: Scope Out: Findings:      One benign-appearing, intrinsic moderate stenosis was found at level of       upper esophageal stricture.      Food was found at the cricopharyngeus. Removal of food was accomplished.       Estimated blood loss: none.      The exam of the esophagus was otherwise normal.      A medium amount of food (residue) was found in the cardia, in the       gastric fundus and in the gastric body.      With limitations of mucosal evaluation given retained gastric contents,       the exam of the stomach was otherwise normal.      Food (residue) was found in the duodenal bulb, precluding good views of       this area. Impression:               - Benign-appearing esophageal stenosis.                           - Food at the cricopharyngeus. Removal was                            successful.                           - A medium amount of food (residue) in the stomach.                           -  Retained food in the duodenum. Moderate Sedation:      None Recommendation:           - Admit the patient to hospital via                            hospitalist/critical care service for ongoing care,                            given concern for significant aspiration prior to                            her arrival in the emergency department.                           - NPO until further notice.                           - Continue present medications.                           [facility] will follow as inpatient. Procedure Code(s):        --- Professional ---                           [phone],  Esophagogastroduodenoscopy, flexible,                            transoral; with removal of foreign body(s) Diagnosis Code(s):        --- Professional ---                           K22.2, Esophageal obstruction                           U04.540J, Food in esophagus causing other injury,                            initial encounter                           T18.3XXA, Foreign body in small intestine, initial                            encounter                           T18.2XXA, Foreign body in stomach, initial encounter                           T18.108A, Unspecified foreign body in esophagus                            causing other injury, initial encounter CPT copyright 2017 [employer]. All rights reserved. The codes documented in this report are preliminary and upon coder review may  be revised to meet current compliance requirements. Willis Modena, MD 12/07/2017 10:47:01 PM This report has  been signed electronically. Number of Addenda: 0

## 2017-12-07 NOTE — H&P (View-Only) (Signed)
[facility] Gastroenterology Consultation Note  Referring Provider: Dr. Rush Landmark Primary Care Physician:  [name], NP  Reason for Consultation:  Suspected esophageal food impaction  HPI: [name] is a 67 y.o. female bedbound from severe multiple sclerosis.  Had choking episode a few hours ago, turned blue, had abdominal thrust maneuver performed.  Post maneuver, persistent sensation of food (spaghetti and beans) stuck in throat with sialorrhea.  Denies prior history of dysphagia.  No chest pain, abdominal pain, blood in stool.   Past Medical History:  Diagnosis Date  . Anxiety   . Arthritis    old falls-with injuries to rt knee and both ankles and rt wrist--states she has some pain in these areas-probable arthritis  . Arthritis    "hands" (09/03/2013)     Hoyer Lift used for transrers  . Bedridden 09/02/2013  . Blindness of right eye    complication of ms  . Cellulitis    hx of cellulitis in lower extremities -requiring multiple hospitalzations in the past--no problems in las 2 yrs  . GERD (gastroesophageal reflux disease)    nexium takes care  . High cholesterol   . Hypothyroidism   . Multiple sclerosis (HCC)    pt is bedridden -does get up to recliner by hoyer lift; mentally alert, able to feed herself -no trouble swallowing. lives at home with husband and has 24 hour caregivers.  . Neuromuscular disorder (HCC)    ms-not ambulatory-muscle spasms-and recent tremors left arm and left leg have developed Dr.--. Daphane Shepherd in [place] is pt's neurologist  . Thyroid disease   . Type II diabetes mellitus (HCC)    diet control - no longer takes any diabetic meds  . Urinary incontinence    neurogenic bladder--hx of uti's  . UTI (lower urinary tract infection)     Past Surgical History:  Procedure Laterality Date  . ADENOIDECTOMY    . CHOLECYSTECTOMY    . CYSTOSCOPY  07/02/2011   Procedure: CYSTOSCOPY;  Surgeon: Kathi Ludwig, MD;  Location: [facility];  Service: Urology;   Laterality: N/A;  . FRACTURE SURGERY     Left Ankle  . INSERTION OF SUPRAPUBIC CATHETER N/A 01/15/2014   Procedure: CYSTOSCOPY WITH BOTOX INJECTION/SUPRAPUBIC TUBE PLACEMENT;  Surgeon: Kathi Ludwig, MD;  Location: [facility];  Service: Urology;  Laterality: N/A;  . INSERTION OF SUPRAPUBIC CATHETER N/A 04/09/2014   Procedure: SUPRAPUBIC TUBE PLACEMENT ;  Surgeon: Kathi Ludwig, MD;  Location: [facility];  Service: Urology;  Laterality: N/A;  . INSERTION OF SUPRAPUBIC CATHETER N/A 08/19/2014   Procedure: PLACEMENT OF SUPRAPUBIC TUBE ,cystoscopy, control of bleeding;  Surgeon: Kathi Ludwig, MD;  Location: [facility];  Service: Urology;  Laterality: N/A;  . LYMPH GLAND EXCISION    . MEDIASTINOSCOPY  04/24/10   for enlarged right paratracheal lymph node--surgery at [facility]-Dr. Burney--pt states she does not know the  what the pathology report showed.  . ORIF ANKLE FRACTURE Left 2005  . TONSILLECTOMY    . WRIST SURGERY Right     Prior to Admission medications   Medication Sig Start Date End Date Taking? Authorizing Provider  cyclobenzaprine (FLEXERIL) 5 MG tablet Take 5-10 mg by mouth See admin instructions. Pt takes 5 mg three times daily and then 10 mg nightly at bedtime.   Yes [provider]  diphenhydrAMINE (BENADRYL) 25 MG tablet Take 50 mg by mouth every 6 (six) hours as needed for itching.   Yes [provider]  docusate sodium (COLACE) 100 MG  capsule Take 100 mg by mouth daily as needed for mild constipation.   Yes [provider]  gabapentin (NEURONTIN) 400 MG capsule Take 800 mg by mouth 4 (four) times daily.    Yes [provider]  meclizine (ANTIVERT) 25 MG tablet Take 25 mg by mouth 3 (three) times daily as needed for dizziness.   Yes [provider]  nystatin (MYCOSTATIN/NYSTOP) 100000 UNIT/GM POWD Apply 1 Bottle topically 2 (two) times daily. To perineal/groin area and right great toe as needed for yeast   Yes [provider]  nystatin cream (MYCOSTATIN) Apply 1 application topically as needed for dry skin.   Yes [provider]  simethicone (GAS-X EXTRA STRENGTH) 125 MG chewable tablet Chew 125 mg by mouth every 6 (six) hours as needed for flatulence.   Yes [provider]  tiZANidine (ZANAFLEX) 4 MG tablet Take 4 mg by mouth 2 (two) times daily. 10/03/17  Yes [provider]  levothyroxine (SYNTHROID) 125 MCG tablet Take 1 tablet (125 mcg total) by mouth daily before breakfast. Patient not taking: Reported on 12/07/2017 05/11/13   Jeralyn Bennett, MD  oxyCODONE-acetaminophen (ROXICET) 5-325 MG per tablet Take 1 tablet by mouth every 4 (four) hours as needed for severe pain. Patient not taking: Reported on 12/07/2017 08/19/14   Jethro Bolus, MD  traMADol-acetaminophen (ULTRACET) 37.5-325 MG per tablet Take 1 tablet by mouth every 6 (six) hours as needed. Patient not taking: Reported on 08/19/2014 04/09/14   Jethro Bolus, MD    No current facility-administered medications for this encounter.    Current Outpatient Medications  Medication Sig Dispense Refill  . cyclobenzaprine (FLEXERIL) 5 MG tablet Take 5-10 mg by mouth See admin instructions. Pt takes 5 mg three times daily and then 10 mg nightly at bedtime.    . diphenhydrAMINE (BENADRYL) 25 MG tablet Take 50 mg by mouth every 6 (six) hours as needed for itching.    . docusate sodium (COLACE) 100 MG capsule Take 100 mg by mouth daily as needed for mild constipation.    . gabapentin (NEURONTIN) 400 MG capsule Take 800 mg by mouth 4 (four) times daily.     . meclizine (ANTIVERT) 25 MG tablet Take 25 mg by mouth 3 (three) times daily as needed for dizziness.    . nystatin (MYCOSTATIN/NYSTOP) 100000 UNIT/GM POWD Apply 1 Bottle topically 2 (two) times daily. To perineal/groin area and right great toe as needed for yeast    . nystatin cream (MYCOSTATIN) Apply 1 application topically as needed for dry skin.    . simethicone (GAS-X  EXTRA STRENGTH) 125 MG chewable tablet Chew 125 mg by mouth every 6 (six) hours as needed for flatulence.    Marland Kitchen tiZANidine (ZANAFLEX) 4 MG tablet Take 4 mg by mouth 2 (two) times daily.  2  . levothyroxine (SYNTHROID) 125 MCG tablet Take 1 tablet (125 mcg total) by mouth daily before breakfast. (Patient not taking: Reported on 12/07/2017) 30 tablet 0  . oxyCODONE-acetaminophen (ROXICET) 5-325 MG per tablet Take 1 tablet by mouth every 4 (four) hours as needed for severe pain. (Patient not taking: Reported on 12/07/2017) 30 tablet 0  . traMADol-acetaminophen (ULTRACET) 37.5-325 MG per tablet Take 1 tablet by mouth every 6 (six) hours as needed. (Patient not taking: Reported on 08/19/2014) 30 tablet 0    Allergies as of 12/07/2017 - Review Complete 12/07/2017  Allergen Reaction Noted  . Avelox [moxifloxacin hcl in nacl] Hives and Other (See Comments) 02/10/2011  . Ciprofloxacin hcl Hives  and Other (See Comments) 02/10/2011  . Penicillins Hives 02/10/2011  . Shrimp [shellfish allergy] Other (See Comments) 02/10/2011    History reviewed. No pertinent family history.  Social History   Socioeconomic History  . Marital status: Married    Spouse name: Not on file  . Number of children: Not on file  . Years of education: Not on file  . Highest education level: Not on file  Occupational History  . Not on file  Social Needs  . Financial resource strain: Not on file  . Food insecurity:    Worry: Not on file    Inability: Not on file  . Transportation needs:    Medical: Not on file    Non-medical: Not on file  Tobacco Use  . Smoking status: Never Smoker  . Smokeless tobacco: Never Used  Substance and Sexual Activity  . Alcohol use: No  . Drug use: No  . Sexual activity: Not Currently    Birth control/protection: Post-menopausal  Lifestyle  . Physical activity:    Days per week: Not on file    Minutes per session: Not on file  . Stress: Not on file  Relationships  . Social connections:     Talks on phone: Not on file    Gets together: Not on file    Attends religious service: Not on file    Active member of club or organization: Not on file    Attends meetings of clubs or organizations: Not on file    Relationship status: Not on file  . Intimate partner violence:    Fear of current or ex partner: Not on file    Emotionally abused: Not on file    Physically abused: Not on file    Forced sexual activity: Not on file  Other Topics Concern  . Not on file  Social History Narrative  . Not on file    Review of Systems: As per HPI all others negative  Physical Exam: Vital signs in last 24 hours: Pulse Rate:  [102-106] 104 (06/08 2102) Resp:  [18-21] 21 (06/08 2102) BP: (145-151)/(74-95) 145/92 (06/08 2102) SpO2:  [82 %-97 %] 94 % (06/08 2111) Weight:  [74.8 kg (165 lb)] 74.8 kg (165 lb) (06/08 1947)   General:   Alert, overweight, chronically ill-appearing, tachypneic-appearing Head:  Normocephalic and atraumatic. Eyes:  Sclera clear, no icterus.   Conjunctiva pink. Ears:  Normal auditory acuity. Nose:  No deformity, discharge,  or lesions. Mouth:  No deformity or lesions.  Oropharynx pink & moist. Neck:  Supple; no masses or thyromegaly. Lungs:  Mild respiratory distress; coarse ronchi throughout all lung fields Heart:  Regular rate and rhythm; no murmurs, clicks, rubs,  or gallops. Abdomen:  Soft, protuberant, nontender and nondistended. No masses, hepatosplenomegaly or hernias noted. Normal bowel sounds, without guarding, and without rebound.     Msk:  Diffusely atrophic but aymmetrical without gross deformities. Normal posture. Pulses:  Normal pulses noted. Extremities:  Contractured, Without clubbing or edema. Neurologic:  Alert and  oriented x4;  grossly normal neurologically. Skin:  Intact without significant lesions or rashes. Cervical Nodes:  No significant cervical adenopathy. Psych:  Alert and cooperative. Normal mood and affect.   Lab Results: No  results for input(s): WBC, HGB, HCT, PLT in the last 72 hours. BMET No results for input(s): NA, K, CL, CO2, GLUCOSE, BUN, CREATININE, CALCIUM in the last 72 hours. LFT No results for input(s): PROT, ALBUMIN, AST, ALT, ALKPHOS, BILITOT, BILIDIR, IBILI in the last 72  hours. PT/INR No results for input(s): LABPROT, INR in the last 72 hours.  Studies/Results: Dg Chest 2 View  Result Date: 12/07/2017 CLINICAL DATA:  Choking on food tonight. EXAM: CHEST - 2 VIEW COMPARISON:  February 20, 2014 FINDINGS: The mediastinal contour is normal. Heart size is mildly enlarged. Both lungs are clear. The visualized skeletal structures are stable. IMPRESSION: No active cardiopulmonary disease. Electronically Signed   By: Sherian Rein M.D.   On: 12/07/2017 21:16    Impression:  1.  Suspected esophageal food impaction. 2.  Hypoxia, improved with supplementation, possible aspiration. 3.  Multiple sclerosis, severe and bedridden.  Plan:  1.  Endoscopy for possible esophageal foreign body removal. 2.  Risks (bleeding, infection, bowel perforation that could require surgery, sedation-related changes in cardiopulmonary systems), benefits (identification and possible treatment of source of symptoms, exclusion of certain causes of symptoms), and alternatives (watchful waiting, radiographic imaging studies, empiric medical treatment) of upper endoscopy (EGD) were explained to patient/family in detail and patient wishes to proceed. 3.  Anesthesia will help with sedation, likely intubate during procedure. 4.  Patient will need to be admitted to [facility] post procedure for monitoring given her tentative respiratory state.   LOS: 0 days   Steffen Hase M  12/07/2017, 10:03 PM  Cell [phone] If no answer or after 5 PM call [phone]

## 2017-12-07 NOTE — Transfer of Care (Signed)
Immediate Anesthesia Transfer of Care Note  Patient: [name]  Procedure(s) Performed: ESOPHAGOGASTRODUODENOSCOPY (EGD) (N/A )  Patient Location: PACU  Anesthesia Type:General  Level of Consciousness: sedated  Airway & Oxygen Therapy: Patient remains intubated per anesthesia plan  Post-op Assessment: Report given to RN and Post -op Vital signs reviewed and stable  Post vital signs: Reviewed and stable  Last Vitals:  Vitals Value Taken Time  BP    Temp    Pulse    Resp    SpO2      Last Pain:  Vitals:   12/07/17 2210  PainSc: 0-No pain         Complications: No apparent anesthesia complications

## 2017-12-07 NOTE — ED Notes (Signed)
Bed: RESB Expected date:  Expected time:  Means of arrival:  Comments: 72 F food bolus

## 2017-12-07 NOTE — ED Notes (Signed)
PT placed on 15L NRB due to no change with [place] at 2L and SPO2 increased to 94%.

## 2017-12-07 NOTE — ED Notes (Signed)
Patient transported to X-ray 

## 2017-12-07 NOTE — H&P (Signed)
History and Physical    [name] WCH:[id number] DOB: 1950/10/04 DOA: 12/07/2017  Referring MD/NP/PA: Dr Rush Landmark  PCP: [name], NP   Outpatient Specialists: None   Patient coming from: SNF  Chief Complaint: Choking  HPI: [name] is a 67 y.o. female with medical history significant of multiple sclerosis, GERD, sacral decubitus ulcer with bedridden status, recurring UTI and diabetes who was apparently eating spaghetti with green beans tonight when she suddenly choked.  Heimlick's maneuver was performed but patient remained short of breath and hypoxic.  EMS was called and patient was brought to the emergency room.  On arrival in the ER patient was found to have oxygen sats in the 80s.  She was struggling to breathe.  Patient was therefore admitted with possible aspiration pneumonitis.  She is scheduled at this point to have GI intervention.  Patient is fully awake and alert.  She is on oxygen by nonrebreather back  ED Course: Patient is tachypneic.  Respiratory rate of 21 and pulse rate 106.  Struggling to breathe on nonrebreather bag.  Oxygen sat 82% on room air but 97% on oxygen by nonrebreather bag.  White count is 10.9.  Chest x-ray showed no active disease  Review of Systems: As per HPI otherwise 10 point review of systems negative.    Past Medical History:  Diagnosis Date  . Anxiety   . Arthritis    old falls-with injuries to rt knee and both ankles and rt wrist--states she has some pain in these areas-probable arthritis  . Arthritis    "hands" (09/03/2013)     Hoyer Lift used for transrers  . Bedridden 09/02/2013  . Blindness of right eye    complication of ms  . Cellulitis    hx of cellulitis in lower extremities -requiring multiple hospitalzations in the past--no problems in las 2 yrs  . GERD (gastroesophageal reflux disease)    nexium takes care  . High cholesterol   . Hypothyroidism   . Multiple sclerosis (HCC)    pt is bedridden -does get up to recliner  by hoyer lift; mentally alert, able to feed herself -no trouble swallowing. lives at home with husband and has 24 hour caregivers.  . Neuromuscular disorder (HCC)    ms-not ambulatory-muscle spasms-and recent tremors left arm and left leg have developed Dr.--. Daphane Shepherd in [place] is pt's neurologist  . Thyroid disease   . Type II diabetes mellitus (HCC)    diet control - no longer takes any diabetic meds  . Urinary incontinence    neurogenic bladder--hx of uti's  . UTI (lower urinary tract infection)     Past Surgical History:  Procedure Laterality Date  . ADENOIDECTOMY    . CHOLECYSTECTOMY    . CYSTOSCOPY  07/02/2011   Procedure: CYSTOSCOPY;  Surgeon: Kathi Ludwig, MD;  Location: [facility];  Service: Urology;  Laterality: N/A;  . FRACTURE SURGERY     Left Ankle  . INSERTION OF SUPRAPUBIC CATHETER N/A 01/15/2014   Procedure: CYSTOSCOPY WITH BOTOX INJECTION/SUPRAPUBIC TUBE PLACEMENT;  Surgeon: Kathi Ludwig, MD;  Location: [facility];  Service: Urology;  Laterality: N/A;  . INSERTION OF SUPRAPUBIC CATHETER N/A 04/09/2014   Procedure: SUPRAPUBIC TUBE PLACEMENT ;  Surgeon: Kathi Ludwig, MD;  Location: [facility];  Service: Urology;  Laterality: N/A;  . INSERTION OF SUPRAPUBIC CATHETER N/A 08/19/2014   Procedure: PLACEMENT OF SUPRAPUBIC TUBE ,cystoscopy, control of bleeding;  Surgeon: Kathi Ludwig, MD;  Location: [facility];  Service:  Urology;  Laterality: N/A;  . LYMPH GLAND EXCISION    . MEDIASTINOSCOPY  04/24/10   for enlarged right paratracheal lymph node--surgery at [facility]-Dr. Burney--pt states she does not know the  what the pathology report showed.  . ORIF ANKLE FRACTURE Left 2005  . TONSILLECTOMY    . WRIST SURGERY Right      reports that she has never smoked. She has never used smokeless tobacco. She reports that she does not drink alcohol or use drugs.  Allergies  Allergen Reactions  . Avelox [Moxifloxacin Hcl In Nacl] Hives and Other (See Comments)    Panic  attack  . Ciprofloxacin Hcl Hives and Other (See Comments)    Panic attack  . Penicillins Hives    Has patient had a PCN reaction causing immediate rash, facial/tongue/throat swelling, SOB or lightheadedness with hypotension: Unknown Has patient had a PCN reaction causing severe rash involving mucus membranes or skin necrosis: Unknown Has patient had a PCN reaction that required hospitalization: Unknown Has patient had a PCN reaction occurring within the last 10 years: Unknown If all of the above answers are "NO", then may proceed with Cephalosporin use.   . Shrimp [Shellfish Allergy] Other (See Comments)    flushing    History reviewed. No pertinent family history.   Prior to Admission medications   Medication Sig Start Date End Date Taking? Authorizing Provider  cyclobenzaprine (FLEXERIL) 5 MG tablet Take 5-10 mg by mouth See admin instructions. Pt takes 5 mg three times daily and then 10 mg nightly at bedtime.   Yes [provider]  diphenhydrAMINE (BENADRYL) 25 MG tablet Take 50 mg by mouth every 6 (six) hours as needed for itching.   Yes [provider]  docusate sodium (COLACE) 100 MG capsule Take 100 mg by mouth daily as needed for mild constipation.   Yes [provider]  gabapentin (NEURONTIN) 400 MG capsule Take 800 mg by mouth 4 (four) times daily.    Yes [provider]  meclizine (ANTIVERT) 25 MG tablet Take 25 mg by mouth 3 (three) times daily as needed for dizziness.   Yes [provider]  nystatin (MYCOSTATIN/NYSTOP) 100000 UNIT/GM POWD Apply 1 Bottle topically 2 (two) times daily. To perineal/groin area and right great toe as needed for yeast   Yes [provider]  nystatin cream (MYCOSTATIN) Apply 1 application topically as needed for dry skin.   Yes [provider]  simethicone (GAS-X EXTRA STRENGTH) 125 MG chewable tablet Chew 125 mg by mouth every 6 (six) hours as needed for flatulence.   Yes [provider]  tiZANidine (ZANAFLEX) 4 MG tablet Take 4 mg by mouth 2 (two) times daily. 10/03/17  Yes [provider]  levothyroxine (SYNTHROID) 125 MCG tablet Take 1 tablet (125 mcg total) by mouth daily before breakfast. Patient not taking: Reported on 12/07/2017 05/11/13   Jeralyn Bennett, MD  oxyCODONE-acetaminophen (ROXICET) 5-325 MG per tablet Take 1 tablet by mouth every 4 (four) hours as needed for severe pain. Patient not taking: Reported on 12/07/2017 08/19/14   Jethro Bolus, MD  traMADol-acetaminophen (ULTRACET) 37.5-325 MG per tablet Take 1 tablet by mouth every 6 (six) hours as needed. Patient not taking: Reported on 08/19/2014 04/09/14   Jethro Bolus, MD    Physical Exam: Vitals:   12/07/17 2026 12/07/17 2102 12/07/17 2111 12/07/17 2210  BP: (!) 151/74 (!) 145/92  119/80  Pulse: (!) 106 (!) 104  (!) 102  Resp: 18 (!) 21  SpO2: 94% (!) 82% 94% 96%  Weight:      Height:          Constitutional: NAD, calm, comfortable Vitals:   12/07/17 2026 12/07/17 2102 12/07/17 2111 12/07/17 2210  BP: (!) 151/74 (!) 145/92  119/80  Pulse: (!) 106 (!) 104  (!) 102  Resp: 18 (!) 21    SpO2: 94% (!) 82% 94% 96%  Weight:      Height:       Chronically ill looking, bedridden Eyes: PERRL, lids and conjunctivae normal ENMT: Mucous membranes are moist. Posterior pharynx clear of any exudate or lesions.Normal dentition.  Neck: normal, supple, no masses, no thyromegaly Respiratory: clear to auscultation bilaterally, no wheezing, no crackles.  Increased respiratory effort. No accessory muscle use.  Cardiovascular: Regular rate and rhythm, no murmurs / rubs / gallops. No extremity edema. 2+ pedal pulses. No carotid bruits.  Abdomen: no tenderness, no masses palpated. No hepatosplenomegaly. Bowel sounds positive.  Musculoskeletal: no clubbing / cyanosis. No joint deformity upper and lower extremities. Good ROM, no contractures. Normal muscle tone.  Skin: no rashes,  lesions, ulcers. No induration Neurologic: CN 2-12 grossly intact. Sensation intact, DTR normal. Strength 5/5 in all 4.  Psychiatric: Normal judgment and insight. Alert and oriented x 3. Normal mood.   Labs on Admission: I have personally reviewed following labs and imaging studies  CBC: No results for input(s): WBC, NEUTROABS, HGB, HCT, MCV, PLT in the last 168 hours. Basic Metabolic Panel: No results for input(s): NA, K, CL, CO2, GLUCOSE, BUN, CREATININE, CALCIUM, MG, PHOS in the last 168 hours. GFR: CrCl cannot be calculated (Patient's most recent lab result is older than the maximum 21 days allowed.). Liver Function Tests: No results for input(s): AST, ALT, ALKPHOS, BILITOT, PROT, ALBUMIN in the last 168 hours. No results for input(s): LIPASE, AMYLASE in the last 168 hours. No results for input(s): AMMONIA in the last 168 hours. Coagulation Profile: No results for input(s): INR, PROTIME in the last 168 hours. Cardiac Enzymes: No results for input(s): CKTOTAL, CKMB, CKMBINDEX, TROPONINI in the last 168 hours. BNP (last 3 results) No results for input(s): PROBNP in the last 8760 hours. HbA1C: No results for input(s): HGBA1C in the last 72 hours. CBG: No results for input(s): GLUCAP in the last 168 hours. Lipid Profile: No results for input(s): CHOL, HDL, LDLCALC, TRIG, CHOLHDL, LDLDIRECT in the last 72 hours. Thyroid Function Tests: No results for input(s): TSH, T4TOTAL, FREET4, T3FREE, THYROIDAB in the last 72 hours. Anemia Panel: No results for input(s): VITAMINB12, FOLATE, FERRITIN, TIBC, IRON, RETICCTPCT in the last 72 hours. Urine analysis:    Component Value Date/Time   COLORURINE YELLOW 07/29/2014 1930   APPEARANCEUR CLOUDY (A) 07/29/2014 1930   LABSPEC 1.015 07/29/2014 1930   PHURINE 7.0 07/29/2014 1930   GLUCOSEU NEGATIVE 07/29/2014 1930   HGBUR LARGE (A) 07/29/2014 1930   BILIRUBINUR NEGATIVE 07/29/2014 1930   KETONESUR NEGATIVE 07/29/2014 1930   PROTEINUR  NEGATIVE 07/29/2014 1930   UROBILINOGEN 1.0 07/29/2014 1930   NITRITE NEGATIVE 07/29/2014 1930   LEUKOCYTESUR LARGE (A) 07/29/2014 1930   Sepsis Labs: @LABRCNTIP (procalcitonin:4,lacticidven:4) )No results found for this or any previous visit (from the past 240 hour(s)).   Radiological Exams on Admission: Dg Chest 2 View  Result Date: 12/07/2017 CLINICAL DATA:  Choking on food tonight. EXAM: CHEST - 2 VIEW COMPARISON:  February 20, 2014 FINDINGS: The mediastinal contour is normal. Heart size is mildly enlarged. Both lungs are clear. The visualized skeletal structures are  stable. IMPRESSION: No active cardiopulmonary disease. Electronically Signed   By: Sherian Rein M.D.   On: 12/07/2017 21:16    Assessment/Plan Principal Problem:   Aspiration pneumonitis (HCC) Active Problems:   Multiple sclerosis (HCC)   GERD (gastroesophageal reflux disease)   DM type 2 (diabetes mellitus, type 2) (HCC)   Sacral decubitus ulcer, stage II   Bedridden   #1 aspiration pneumonitis: Patient is going for EGD with possible removal of full debris.  She is expected to be intubated for procedure in the OR.  Postoperatively will initiate antibiotics.  #2 multiple sclerosis: Appears to be at baseline.  Patient may have aspirated due to progressive disease.  Continue home regimen  #3 GERD: Continue with PPIs  #4 diabetes: Continue sliding scale insulin and home regimen except metformin  #5 sacral decubitus ulcer: Wound care in the hospital    DVT prophylaxis: SCD  Code Status: Full  Family Communication: None available  Disposition Plan: SNF Consults called: GI, Dr Dulce Sellar  Admission status: inpatient  Severity of Illness: The appropriate patient status for this patient is INPATIENT. Inpatient status is judged to be reasonable and necessary in order to provide the required intensity of service to ensure the patient's safety. The patient's presenting symptoms, physical exam findings, and initial  radiographic and laboratory data in the context of their chronic comorbidities is felt to place them at high risk for further clinical deterioration. Furthermore, it is not anticipated that the patient will be medically stable for discharge from the hospital within 2 midnights of admission. The following factors support the patient status of inpatient.   " The patient's presenting symptoms include shortness of breath and choking. " The worrisome physical exam findings include hypoxemia. " The initial radiographic and laboratory data are worrisome because of hypoxemia. " The chronic co-morbidities include history of multiple sclerotic.   * I certify that at the point of admission it is my clinical judgment that the patient will require inpatient hospital care spanning beyond 2 midnights from the point of admission due to high intensity of service, high risk for further deterioration and high frequency of surveillance required.Lonia Blood MD [facility] Pager [phone]  If 7PM-7AM, please contact night-coverage [email or web address] Password [email or web address]  12/07/2017, 10:23 PM

## 2017-12-07 NOTE — ED Triage Notes (Signed)
Pt presents by EMS for evaluation of food bolus that occurred at home. EMS reports that pt was eating dinner and home health nurse noticed pt begin to start choking and performed the Heimlich Maneuver and then coloring began to improve and pt was able to breath without difficulty. EMS reports that while at residence fluid challenge was attempted but unsuccessful.

## 2017-12-07 NOTE — Anesthesia Preprocedure Evaluation (Signed)
Anesthesia Evaluation  Patient identified by MRN, date of birth, ID band Patient awake    Reviewed: Allergy & Precautions, NPO status , Patient's Chart, lab work & pertinent test results  Airway Mallampati: III  TM Distance: >3 FB Neck ROM: Full    Dental  (+) Edentulous Upper, Edentulous Lower   Pulmonary shortness of breath,  Concern for aspiration due to food bolus impaction   + rhonchi        Cardiovascular negative cardio ROS   Rhythm:Regular     Neuro/Psych MS, urinary dysfunction  Neuromuscular disease    GI/Hepatic GERD  ,Food impaction   Endo/Other  diabetesHypothyroidism Morbid obesity  Renal/GU Renal disease     Musculoskeletal  (+) Arthritis ,   Abdominal   Peds  Hematology   Anesthesia Other Findings   Reproductive/Obstetrics                             Anesthesia Physical Anesthesia Plan  ASA: III and emergent  Anesthesia Plan: General   Post-op Pain Management:    Induction: Intravenous and Cricoid pressure planned  PONV Risk Score and Plan: 3 and Treatment may vary due to age or medical condition  Airway Management Planned: Oral ETT  Additional Equipment: None  Intra-op Plan:   Post-operative Plan: Post-operative intubation/ventilation  Informed Consent: I have reviewed the patients History and Physical, chart, labs and discussed the procedure including the risks, benefits and alternatives for the proposed anesthesia with the patient or authorized representative who has indicated his/her understanding and acceptance.   Dental advisory given  Plan Discussed with: Surgeon and CRNA  Anesthesia Plan Comments:         Anesthesia Quick Evaluation

## 2017-12-07 NOTE — ED Notes (Signed)
Pt returned from [facility] and then oxygen saturation began to decrease to 82% on room air. Dr.Tegeler notified and pt then placed on 2L O2 via [place].

## 2017-12-07 NOTE — ED Provider Notes (Signed)
[facility]-EMERGENCY DEPT Provider Note   CSN: [id number] Arrival date & time: 12/07/17  1931     History   Chief Complaint Chief Complaint  Patient presents with  . food bolus    HPI [name] is a 67 y.o. female.  The history is provided by the patient, a caregiver and medical records.  Shortness of Breath  This is a new problem. The problem occurs continuously.The current episode started less than 1 hour ago. The problem has not changed since onset.Associated symptoms include neck pain. Pertinent negatives include no fever, no cough, no sputum production, no wheezing, no chest pain, no vomiting and no abdominal pain. Precipitated by: choking. She has tried nothing for the symptoms. The treatment provided no relief.    Past Medical History:  Diagnosis Date  . Anxiety   . Arthritis    old falls-with injuries to rt knee and both ankles and rt wrist--states she has some pain in these areas-probable arthritis  . Arthritis    "hands" (09/03/2013)     Hoyer Lift used for transrers  . Bedridden 09/02/2013  . Blindness of right eye    complication of ms  . Cellulitis    hx of cellulitis in lower extremities -requiring multiple hospitalzations in the past--no problems in las 2 yrs  . GERD (gastroesophageal reflux disease)    nexium takes care  . High cholesterol   . Hypothyroidism   . Multiple sclerosis (HCC)    pt is bedridden -does get up to recliner by hoyer lift; mentally alert, able to feed herself -no trouble swallowing. lives at home with husband and has 24 hour caregivers.  . Neuromuscular disorder (HCC)    ms-not ambulatory-muscle spasms-and recent tremors left arm and left leg have developed Dr.--. Daphane Shepherd in [place] is pt's neurologist  . Thyroid disease   . Type II diabetes mellitus (HCC)    diet control - no longer takes any diabetic meds  . Urinary incontinence    neurogenic bladder--hx of uti's  . UTI (lower urinary tract infection)      Patient Active Problem List   Diagnosis Date Noted  . AKI (acute kidney injury) (HCC) 02/18/2014  . SBO (small bowel obstruction) (HCC) 02/17/2014  . Leukocytosis 02/17/2014  . Dyslipidemia 02/17/2014  . Neuropathic pain 02/17/2014  . Sacral decubitus ulcer, stage II 09/02/2013  . Bedridden 09/02/2013  . UTI (urinary tract infection) 09/01/2013  . DM type 2 (diabetes mellitus, type 2) (HCC) 09/01/2013  . Multiple sclerosis (HCC)   . GERD (gastroesophageal reflux disease)   . Hypothyroidism   . Blindness of right eye     Past Surgical History:  Procedure Laterality Date  . ADENOIDECTOMY    . CHOLECYSTECTOMY    . CYSTOSCOPY  07/02/2011   Procedure: CYSTOSCOPY;  Surgeon: Kathi Ludwig, MD;  Location: [facility];  Service: Urology;  Laterality: N/A;  . FRACTURE SURGERY     Left Ankle  . INSERTION OF SUPRAPUBIC CATHETER N/A 01/15/2014   Procedure: CYSTOSCOPY WITH BOTOX INJECTION/SUPRAPUBIC TUBE PLACEMENT;  Surgeon: Kathi Ludwig, MD;  Location: [facility];  Service: Urology;  Laterality: N/A;  . INSERTION OF SUPRAPUBIC CATHETER N/A 04/09/2014   Procedure: SUPRAPUBIC TUBE PLACEMENT ;  Surgeon: Kathi Ludwig, MD;  Location: [facility];  Service: Urology;  Laterality: N/A;  . INSERTION OF SUPRAPUBIC CATHETER N/A 08/19/2014   Procedure: PLACEMENT OF SUPRAPUBIC TUBE ,cystoscopy, control of bleeding;  Surgeon: Kathi Ludwig, MD;  Location: [facility];  Service: Urology;  Laterality: N/A;  . LYMPH GLAND EXCISION    . MEDIASTINOSCOPY  04/24/10   for enlarged right paratracheal lymph node--surgery at [facility]-Dr. Burney--pt states she does not know the  what the pathology report showed.  . ORIF ANKLE FRACTURE Left 2005  . TONSILLECTOMY    . WRIST SURGERY Right      OB History   None      Home Medications    Prior to Admission medications   Medication Sig Start Date End Date Taking? Authorizing Provider  aspirin EC 81 MG tablet Take 81 mg by mouth daily.    [provider]  atorvastatin (LIPITOR) 20 MG tablet Take 20 mg by mouth at bedtime.    [provider]  CRANBERRY PO Take 1 tablet by mouth daily as needed (for urinary pain).     [provider]  cyclobenzaprine (FLEXERIL) 5 MG tablet Take 5-10 mg by mouth See admin instructions. Pt takes 5 mg three times daily and then 10 mg nightly at bedtime.    [provider]  darifenacin (ENABLEX) 7.5 MG 24 hr tablet Take 7.5 mg by mouth 2 (two) times daily.    [provider]  diphenhydrAMINE (BENADRYL) 25 MG tablet Take 50 mg by mouth every 6 (six) hours as needed for itching.    [provider]  docusate sodium (COLACE) 100 MG capsule Take 100 mg by mouth daily as needed for mild constipation.    [provider]  esomeprazole (NEXIUM) 40 MG capsule Take 40 mg by mouth every morning.     [provider]  furosemide (LASIX) 20 MG tablet Take 20 mg by mouth 2 (two) times daily.    [provider]  gabapentin (NEURONTIN) 400 MG capsule Take 800 mg by mouth 4 (four) times daily.     [provider]  levothyroxine (SYNTHROID) 125 MCG tablet Take 1 tablet (125 mcg total) by mouth daily before breakfast. 05/11/13   Jeralyn Bennett, MD  liver oil-zinc oxide (DESITIN) 40 % ointment Apply 1 application topically as needed for irritation.    [provider]  meclizine (ANTIVERT) 25 MG tablet Take 25 mg by mouth 3 (three) times daily as needed for dizziness.    [provider]  mineral oil enema Place 1 enema rectally once.    [provider]  mineral oil-hydrophilic petrolatum (AQUAPHOR) ointment Apply 1 application topically as needed for dry skin.    [provider]  Multiple Vitamin (MULTIVITAMIN WITH MINERALS) TABS tablet Take 1 tablet by mouth daily.    [provider]  nitrofurantoin (MACRODANTIN) 50 MG capsule Take 50 mg by mouth daily.    [provider]  nystatin  (MYCOSTATIN/NYSTOP) 100000 UNIT/GM POWD Apply 1 Bottle topically 2 (two) times daily. To perineal/groin area and right great toe as needed for yeast    [provider]  nystatin cream (MYCOSTATIN) Apply 1 application topically as needed for dry skin.    [provider]  oxyCODONE-acetaminophen (ROXICET) 5-325 MG per tablet Take 1 tablet by mouth every 4 (four) hours as needed for severe pain. 08/19/14   Jethro Bolus, MD  simethicone (GAS-X EXTRA STRENGTH) 125 MG chewable tablet Chew 125 mg by mouth every 6 (six) hours as needed for flatulence.    [provider]  sulfamethoxazole-trimethoprim (BACTRIM DS,SEPTRA DS) 800-160 MG per tablet Take 1 tablet by mouth 2 (two) times daily. For 7 days.    [provider]  traMADol-acetaminophen Alphonzo Severance)  37.5-325 MG per tablet Take 1 tablet by mouth every 6 (six) hours as needed. Patient not taking: Reported on 08/19/2014 04/09/14   Jethro Bolus, MD  trolamine salicylate (ASPERCREME) 10 % cream Apply 1 application topically as needed for muscle pain.    [provider]    Family History History reviewed. No pertinent family history.  Social History Social History   Tobacco Use  . Smoking status: Never Smoker  . Smokeless tobacco: Never Used  Substance Use Topics  . Alcohol use: No  . Drug use: No     Allergies   Avelox [moxifloxacin hcl in nacl]; Ciprofloxacin hcl; Penicillins; and Shrimp [shellfish allergy]   Review of Systems Review of Systems  Constitutional: Negative for chills, diaphoresis, fatigue and fever.  HENT: Positive for trouble swallowing.   Respiratory: Positive for choking and shortness of breath. Negative for cough, sputum production, chest tightness, wheezing and stridor.   Cardiovascular: Negative for chest pain and palpitations.  Gastrointestinal: Negative for abdominal pain, constipation, diarrhea, nausea and vomiting.  Genitourinary: Negative for dysuria.    Musculoskeletal: Positive for neck pain. Negative for back pain and neck stiffness.  Neurological: Negative for light-headedness.  Psychiatric/Behavioral: Positive for agitation. Negative for confusion.  All other systems reviewed and are negative.    Physical Exam Updated Vital Signs BP (!) 151/95 (BP Location: Left Arm)   Pulse (!) 102   Resp 20   Ht 5\' 2"  (1.575 m)   Wt 74.8 kg (165 lb)   SpO2 97%   BMI 30.18 kg/m   Physical Exam  Constitutional: She is oriented to person, place, and time. She appears well-developed and well-nourished. No distress.  HENT:  Head: Atraumatic.  Mouth/Throat: Oropharynx is clear and moist. No oropharyngeal exudate.  Patient had coarse breath sounds in her throat.  No stridor.  Eyes: Pupils are equal, round, and reactive to light. Conjunctivae are normal.  Neck: Neck supple.  Cardiovascular: Normal rate and regular rhythm.  No murmur heard. Pulmonary/Chest: No stridor. Tachypnea noted. She is in respiratory distress. She has no wheezes. She has rhonchi. She has no rales. She exhibits no tenderness.  Abdominal: Soft. There is no tenderness.  Musculoskeletal: She exhibits no edema or tenderness.  Lymphadenopathy:    She has no cervical adenopathy.  Neurological: She is alert and oriented to person, place, and time.  Skin: Skin is warm and dry. No rash noted. She is not diaphoretic. No erythema.  Psychiatric: She has a normal mood and affect.  Nursing note and vitals reviewed.    ED Treatments / Results  Labs (all labs ordered are listed, but only abnormal results are displayed) Labs Reviewed  GLUCOSE, CAPILLARY - Abnormal; Notable for the following components:      Result Value   Glucose-Capillary 104 (*)    All other components within normal limits  MRSA PCR SCREENING  HIV ANTIBODY (ROUTINE TESTING)  CBC  CREATININE, SERUM  COMPREHENSIVE METABOLIC PANEL  CBC  BLOOD GAS, ARTERIAL    EKG None  Radiology Dg Chest 2  View  Result Date: 12/07/2017 CLINICAL DATA:  Choking on food tonight. EXAM: CHEST - 2 VIEW COMPARISON:  February 20, 2014 FINDINGS: The mediastinal contour is normal. Heart size is mildly enlarged. Both lungs are clear. The visualized skeletal structures are stable. IMPRESSION: No active cardiopulmonary disease. Electronically Signed   By: Sherian Rein M.D.   On: 12/07/2017 21:16    Procedures Procedures (including critical care time)  CRITICAL CARE Performed by: Ashley Brim  Juan Olthoff Total critical care time: 35 minutes Hypoxic respiratory failure requiring nonrebreather and going directly to operating room for food bolus removal. Critical care time was exclusive of separately billable procedures and treating other patients. Critical care was necessary to treat or prevent imminent or life-threatening deterioration. Critical care was time spent personally by me on the following activities: development of treatment plan with patient and/or surrogate as well as nursing, discussions with consultants, evaluation of patient's response to treatment, examination of patient, obtaining history from patient or surrogate, ordering and performing treatments and interventions, ordering and review of laboratory studies, ordering and review of radiographic studies, pulse oximetry and re-evaluation of patient's condition.   Medications Ordered in ED Medications  lactated ringers infusion ( Intravenous Anesthesia Volume Adjustment 12/07/17 2257)  fentaNYL (SUBLIMAZE) injection 25-50 mcg (25 mcg Intravenous Given 12/07/17 2328)     Initial Impression / Assessment and Plan / ED Course  I have reviewed the triage vital signs and the nursing notes.  Pertinent labs & imaging results that were available during my care of the patient were reviewed by me and considered in my medical decision making (see chart for details).     [name] is a 67 y.o. female medical history significant for diabetes,  hypothyroidism, multiple sclerosis, and GERD who presents with choking and concern for food bolus.  Patient is accompanied by a caregiver who reports that at dinner this evening, the patient was eating green beans and spaghetti and then had a choking episode.  Patient was making gurgling sounds and then began turning blue.  Patient was having decreased responsiveness and mental status when the caregiver performed a Heimlich maneuver.  Patient has spit up a small amount of emesis but no large chunks of food return.  She then had improvement in her color and began breathing better.  She still feels pain in her throat and feels that something is stuck.  She denies any chest pain but reports she is unable to tolerate secretions.  Patient tried to drink water and milk at her facility but got more choked and could not swallow anything.  Next  On arrival, patient's oxygen saturations are in the 90s and she is borderline tachycardic.  Next  On my exam, patient had no stridor but had gurgling and  coarse sounds in her throat.  Oropharyngeal exam externally was unremarkable.  Patient's lungs had transmitted upper airway sounds, no crackles appreciated in the bases.  Chest was nontender.  Abdomen was nontender.  Patient was alert and oriented.  Patient's oxygen saturations began to decrease into the mid low 80s.  Patient placed on nasal cannula oxygen supplementation with some improvement.  Chest x-ray was obtained.  Patient may have had aspiration contributing to her hypoxia with pneumonitis or some plugging.  Gastroenterology was called who will come see the patient.  They feel patient will likely need anesthesia involvement and going to the OR for evaluation and management of food bolus/aspiration.  9:22 PM Chest x-ray shows no active cardiopulmonary disease.  9:27 PM Dr. Dulce Sellar with GI is going to take patient to the OR for food bolus removal and further exploration.  Given the patient's hypoxia cyanosis and  choking episode in the setting of her chronic medical problems, I am concerned patient may have aspirated and may need to be watched overnight even if her food bolus is successfully removed.    Hospital team will be called for admission for further management after the patient leaves the OR.  Charge nurse spoke with the GI team who reports that patient was unable to be extubated.  Patient will need to be admitted to the ICU as she is on a ventilator.    Critical care team was called and they will arrange admission for the ventilated patient.  Patient admitted in stable condition to the OR.   Final Clinical Impressions(s) / ED Diagnoses   Final diagnoses:  Choking, initial encounter     Clinical Impression: 1. Choking, initial encounter   2. Aspiration pneumonitis (HCC)     Disposition: Admit  This note was prepared with assistance of Dragon voice recognition software. Occasional wrong-word or sound-a-like substitutions may have occurred due to the inherent limitations of voice recognition software.     Tiaunna Buford, Ashley Brim, MD 12/08/17 [phone]

## 2017-12-07 NOTE — Anesthesia Procedure Notes (Signed)
Procedure Name: Intubation Date/Time: 12/07/2017 10:27 PM Performed by: Vanessa Lowrys, CRNA Pre-anesthesia Checklist: Patient identified, Emergency Drugs available, Suction available and Patient being monitored Patient Re-evaluated:Patient Re-evaluated prior to induction Oxygen Delivery Method: Circle system utilized Preoxygenation: Pre-oxygenation with 100% oxygen Induction Type: IV induction, Cricoid Pressure applied and Rapid sequence Laryngoscope Size: 2 and Miller Grade View: Grade I Tube type: Oral Tube size: 7.0 mm Number of attempts: 1 Airway Equipment and Method: Stylet Placement Confirmation: ETT inserted through vocal cords under direct vision,  positive ETCO2 and breath sounds checked- equal and bilateral Secured at: 21 cm Tube secured with: Tape Dental Injury: Teeth and Oropharynx as per pre-operative assessment

## 2017-12-07 NOTE — Interval H&P Note (Signed)
History and Physical Interval Note:  12/07/2017 10:08 PM  [name]  has presented today for surgery, with the diagnosis of Food impaction  The various methods of treatment have been discussed with the patient and family. After consideration of risks, benefits and other options for treatment, the patient has consented to  Procedure(s): ESOPHAGOGASTRODUODENOSCOPY (EGD) (N/A) as a surgical intervention .  The patient's history has been reviewed, patient examined, no change in status, stable for surgery.  I have reviewed the patient's chart and labs.  Questions were answered to the patient's satisfaction.     Freddy Jaksch

## 2017-12-08 ENCOUNTER — Inpatient Hospital Stay (HOSPITAL_COMMUNITY): Payer: Medicare Other

## 2017-12-08 DIAGNOSIS — J9601 Acute respiratory failure with hypoxia: Secondary | ICD-10-CM

## 2017-12-08 DIAGNOSIS — K219 Gastro-esophageal reflux disease without esophagitis: Secondary | ICD-10-CM

## 2017-12-08 LAB — CBC
HCT: 46 % (ref 36.0–46.0)
HEMATOCRIT: 45.9 % (ref 36.0–46.0)
HEMOGLOBIN: 15.2 g/dL — AB (ref 12.0–15.0)
Hemoglobin: 15.3 g/dL — ABNORMAL HIGH (ref 12.0–15.0)
MCH: 31.5 pg (ref 26.0–34.0)
MCH: 31.9 pg (ref 26.0–34.0)
MCHC: 33 g/dL (ref 30.0–36.0)
MCHC: 33.3 g/dL (ref 30.0–36.0)
MCV: 95.4 fL (ref 78.0–100.0)
MCV: 95.6 fL (ref 78.0–100.0)
PLATELETS: 498 10*3/uL — AB (ref 150–400)
PLATELETS: 546 10*3/uL — AB (ref 150–400)
RBC: 4.8 MIL/uL (ref 3.87–5.11)
RBC: 4.82 MIL/uL (ref 3.87–5.11)
RDW: 12.9 % (ref 11.5–15.5)
RDW: 12.9 % (ref 11.5–15.5)
WBC: 14.9 10*3/uL — ABNORMAL HIGH (ref 4.0–10.5)
WBC: 23.8 10*3/uL — ABNORMAL HIGH (ref 4.0–10.5)

## 2017-12-08 LAB — BLOOD GAS, ARTERIAL
ACID-BASE DEFICIT: 0.6 mmol/L (ref 0.0–2.0)
Bicarbonate: 24 mmol/L (ref 20.0–28.0)
Drawn by: 232811
FIO2: 40
MODE: POSITIVE
O2 SAT: 93.9 %
PEEP/CPAP: 5 cmH2O
PH ART: 7.381 (ref 7.350–7.450)
Patient temperature: 98.6
Pressure support: 10 cmH2O
pCO2 arterial: 41.4 mmHg (ref 32.0–48.0)
pO2, Arterial: 70.8 mmHg — ABNORMAL LOW (ref 83.0–108.0)

## 2017-12-08 LAB — CREATININE, SERUM
CREATININE: 0.56 mg/dL (ref 0.44–1.00)
GFR calc Af Amer: >60 mL/min (ref >60)

## 2017-12-08 LAB — URINALYSIS, ROUTINE W REFLEX MICROSCOPIC
Bilirubin Urine: NEGATIVE
GLUCOSE, UA: NEGATIVE mg/dL
Nitrite: NEGATIVE
PH: 6.5 (ref 5.0–8.0)
SPECIFIC GRAVITY, URINE: 1.015 (ref 1.005–1.030)

## 2017-12-08 LAB — COMPREHENSIVE METABOLIC PANEL
ALBUMIN: 3.6 g/dL (ref 3.5–5.0)
ALT: 12 U/L — ABNORMAL LOW (ref 14–54)
AST: 17 U/L (ref 15–41)
Alkaline Phosphatase: 98 U/L (ref 38–126)
Anion gap: 10 (ref 5–15)
BILIRUBIN TOTAL: 1.1 mg/dL (ref 0.3–1.2)
BUN: 12 mg/dL (ref 6–20)
CALCIUM: 8.7 mg/dL — AB (ref 8.9–10.3)
CO2: 27 mmol/L (ref 22–32)
Chloride: 106 mmol/L (ref 101–111)
Creatinine, Ser: 0.6 mg/dL (ref 0.44–1.00)
GFR calc non Af Amer: >60 mL/min (ref >60)
GLUCOSE: 138 mg/dL — AB (ref 65–99)
POTASSIUM: 3.6 mmol/L (ref 3.5–5.1)
SODIUM: 143 mmol/L (ref 135–145)
TOTAL PROTEIN: 7.1 g/dL (ref 6.5–8.1)

## 2017-12-08 LAB — TSH: TSH: 38.484 u[IU]/mL — AB (ref 0.350–4.500)

## 2017-12-08 LAB — URINALYSIS, MICROSCOPIC (REFLEX)

## 2017-12-08 LAB — GLUCOSE, CAPILLARY
GLUCOSE-CAPILLARY: 114 mg/dL — AB (ref 65–99)
Glucose-Capillary: 139 mg/dL — ABNORMAL HIGH (ref 65–99)

## 2017-12-08 LAB — HIV ANTIBODY (ROUTINE TESTING W REFLEX): HIV SCREEN 4TH GENERATION: NONREACTIVE

## 2017-12-08 LAB — BRAIN NATRIURETIC PEPTIDE: B Natriuretic Peptide: 37.2 pg/mL (ref 0.0–100.0)

## 2017-12-08 LAB — MAGNESIUM: Magnesium: 1.7 mg/dL (ref 1.7–2.4)

## 2017-12-08 LAB — PHOSPHORUS: Phosphorus: 3 mg/dL (ref 2.5–4.6)

## 2017-12-08 LAB — T4, FREE: Free T4: 0.76 ng/dL — ABNORMAL LOW (ref 0.82–1.77)

## 2017-12-08 LAB — PROCALCITONIN: Procalcitonin: 1.12 ng/mL

## 2017-12-08 MED ORDER — VANCOMYCIN HCL 10 G IV SOLR
1250.0000 mg | INTRAVENOUS | Status: DC
  Administered 2017-12-09: 1250 mg via INTRAVENOUS
  Filled 2017-12-08: qty 1250

## 2017-12-08 MED ORDER — INSULIN ASPART 100 UNIT/ML ~~LOC~~ SOLN
0.0000 [IU] | Freq: Three times a day (TID) | SUBCUTANEOUS | Status: DC
Start: 2017-12-08 — End: 2017-12-11
  Administered 2017-12-10: 1 [IU] via SUBCUTANEOUS

## 2017-12-08 MED ORDER — OXYCODONE-ACETAMINOPHEN 5-325 MG PO TABS: 1.0000 | ORAL_TABLET | ORAL | Status: DC | PRN

## 2017-12-08 MED ORDER — TRAMADOL-ACETAMINOPHEN 37.5-325 MG PO TABS: 1.0000 | ORAL_TABLET | Freq: Four times a day (QID) | ORAL | Status: DC | PRN

## 2017-12-08 MED ORDER — SENNOSIDES-DOCUSATE SODIUM 8.6-50 MG PO TABS
1.0000 | ORAL_TABLET | Freq: Two times a day (BID) | ORAL | Status: DC
  Administered 2017-12-08 – 2017-12-11 (×5): 1 via ORAL
  Filled 2017-12-08 (×6): qty 1

## 2017-12-08 MED ORDER — POLYETHYLENE GLYCOL 3350 17 G PO PACK
17.0000 g | PACK | Freq: Two times a day (BID) | ORAL | Status: DC
  Administered 2017-12-08 – 2017-12-11 (×5): 17 g via ORAL
  Filled 2017-12-08 (×6): qty 1

## 2017-12-08 MED ORDER — MEROPENEM 1 G IV SOLR
1.0000 g | Freq: Three times a day (TID) | INTRAVENOUS | Status: DC
  Administered 2017-12-08 – 2017-12-11 (×11): 1 g via INTRAVENOUS
  Filled 2017-12-08 (×14): qty 1

## 2017-12-08 MED ORDER — IPRATROPIUM-ALBUTEROL 0.5-2.5 (3) MG/3ML IN SOLN: 3.0000 mL | Freq: Four times a day (QID) | RESPIRATORY_TRACT | Status: DC | PRN

## 2017-12-08 MED ORDER — LEVOTHYROXINE SODIUM 125 MCG PO TABS
125.0000 ug | ORAL_TABLET | Freq: Every day | ORAL | Status: DC
  Administered 2017-12-08 – 2017-12-11 (×4): 125 ug via ORAL
  Filled 2017-12-08 (×4): qty 1

## 2017-12-08 MED ORDER — VANCOMYCIN HCL 10 G IV SOLR
1500.0000 mg | Freq: Once | INTRAVENOUS | Status: AC
  Administered 2017-12-08: 1500 mg via INTRAVENOUS
  Filled 2017-12-08: qty 1500

## 2017-12-08 NOTE — Consult Note (Addendum)
Initial Pulmonary/Critical Care Consultation  Patient Name: [name] MRN: [id number] DOB: 1950-07-12    ADMISSION DATE:  12/07/2017 CONSULTATION DATE:  12/08/17   REFERRING MD:  Dr. Jossie Ng  REASON FOR CONSULTATION:  Ventilator management, post-anesthesia care   HISTORY OF PRESENT ILLNESS  This 67 y.o. Caucasian female is seen in consultation at the request of Dr. Jossie Ng for recommendations on further evaluation and management of post-anesthesia care and acute hypoxemic respiratory failure.  The patient presented earlier this evening after having a witnessed aspiration event while eating dinner.  Patient's caregiver performed a Heimlich maneuver and successfully was able to dislodge foodstuffs.  They presented to the emergency department for further evaluation.  The patient apparently endorsed persistent foreign body sensation.  Initially, she was not in any respiratory distress; however, when in the radiology suite for chest x-ray, the patient experienced orthopnea associated with oxygen desaturation.  She was placed on a 100% nonrebreather for supplemental oxygenation.  Decision was made to take her to the endoscopy suite for possible evacuation of an impacted esophagus.  The patient was electively intubated in the endoscopy suite.  Endoscopy was performed without difficulty.  Residual food was identified without overt impaction.  Patient returned to the ICU still on mechanical ventilatory support.  At the time of clinical interview, the patient is awake despite sedation with fentanyl infusion.  She is interactive, follows commands and answers appropriately.  Of note, the patient does have multiple sclerosis at baseline and is unable to move her lower extremities (stable).  REVIEW OF SYSTEMS: As highlighted above in the HPI.  This patient cannot provide additional history due to intubated state, unable to speak.   PAST MEDICAL/SURGICAL/SOCIAL/FAMILY HISTORIES   Past  Medical History:  Diagnosis Date  . Anxiety   . Arthritis    old falls-with injuries to rt knee and both ankles and rt wrist--states she has some pain in these areas-probable arthritis  . Arthritis    "hands" (09/03/2013)     Hoyer Lift used for transrers  . Bedridden 09/02/2013  . Blindness of right eye    complication of ms  . Cellulitis    hx of cellulitis in lower extremities -requiring multiple hospitalzations in the past--no problems in las 2 yrs  . GERD (gastroesophageal reflux disease)    nexium takes care  . High cholesterol   . Hypothyroidism   . Multiple sclerosis ([place])    pt is bedridden -does get up to recliner by hoyer lift; mentally alert, able to feed herself -no trouble swallowing. lives at home with husband and has 24 hour caregivers.  . Neuromuscular disorder ([place])    ms-not ambulatory-muscle spasms-and recent tremors left arm and left leg have developed Dr.--. Bjorn Loser in [place] is pt's neurologist  . Thyroid disease   . Type II diabetes mellitus (HCC)    diet control - no longer takes any diabetic meds  . Urinary incontinence    neurogenic bladder--hx of uti's  . UTI (lower urinary tract infection)     Past Surgical History:  Procedure Laterality Date  . ADENOIDECTOMY    . CHOLECYSTECTOMY    . CYSTOSCOPY  07/02/2011   Procedure: CYSTOSCOPY;  Surgeon: Ailene Rud, MD;  Location: [facility];  Service: Urology;  Laterality: N/A;  . FRACTURE SURGERY     Left Ankle  . INSERTION OF SUPRAPUBIC CATHETER N/A 01/15/2014   Procedure: CYSTOSCOPY WITH BOTOX INJECTION/SUPRAPUBIC TUBE PLACEMENT;  Surgeon: Ailene Rud, MD;  Location: [facility];  Service: Urology;  Laterality: N/A;  . INSERTION OF SUPRAPUBIC CATHETER N/A 04/09/2014   Procedure: SUPRAPUBIC TUBE PLACEMENT ;  Surgeon: Ailene Rud, MD;  Location: [facility];  Service: Urology;  Laterality: N/A;  . INSERTION OF SUPRAPUBIC CATHETER N/A 08/19/2014   Procedure: PLACEMENT OF SUPRAPUBIC TUBE  ,cystoscopy, control of bleeding;  Surgeon: Ailene Rud, MD;  Location: [facility];  Service: Urology;  Laterality: N/A;  . LYMPH GLAND EXCISION    . MEDIASTINOSCOPY  04/24/10   for enlarged right paratracheal lymph node--surgery at [facility]-Dr. Burney--pt states she does not know the  what the pathology report showed.  . ORIF ANKLE FRACTURE Left 2005  . TONSILLECTOMY    . WRIST SURGERY Right     Social History   Tobacco Use  . Smoking status: Never Smoker  . Smokeless tobacco: Never Used  Substance Use Topics  . Alcohol use: No    History reviewed. No pertinent family history.   Allergies  Allergen Reactions  . Avelox [Moxifloxacin Hcl In Nacl] Hives and Other (See Comments)    Panic attack  . Ciprofloxacin Hcl Hives and Other (See Comments)    Panic attack  . Penicillins Hives    Has patient had a PCN reaction causing immediate rash, facial/tongue/throat swelling, SOB or lightheadedness with hypotension: Unknown Has patient had a PCN reaction causing severe rash involving mucus membranes or skin necrosis: Unknown Has patient had a PCN reaction that required hospitalization: Unknown Has patient had a PCN reaction occurring within the last 10 years: Unknown If all of the above answers are "NO", then may proceed with Cephalosporin use.   . Shrimp [Shellfish Allergy] Other (See Comments)    flushing     Prior to Admission medications   Medication Sig Start Date End Date Taking? Authorizing Provider  cyclobenzaprine (FLEXERIL) 5 MG tablet Take 5-10 mg by mouth See admin instructions. Pt takes 5 mg three times daily and then 10 mg nightly at bedtime.   Yes [provider]  diphenhydrAMINE (BENADRYL) 25 MG tablet Take 50 mg by mouth every 6 (six) hours as needed for itching.   Yes [provider]  docusate sodium (COLACE) 100 MG capsule Take 100 mg by mouth daily as needed for mild constipation.   Yes [provider]  gabapentin (NEURONTIN) 400 MG  capsule Take 800 mg by mouth 4 (four) times daily.    Yes [provider]  meclizine (ANTIVERT) 25 MG tablet Take 25 mg by mouth 3 (three) times daily as needed for dizziness.   Yes [provider]  nystatin (MYCOSTATIN/NYSTOP) 100000 UNIT/GM POWD Apply 1 Bottle topically 2 (two) times daily. To perineal/groin area and right great toe as needed for yeast   Yes [provider]  nystatin cream (MYCOSTATIN) Apply 1 application topically as needed for dry skin.   Yes [provider]  simethicone (GAS-X EXTRA STRENGTH) 125 MG chewable tablet Chew 125 mg by mouth every 6 (six) hours as needed for flatulence.   Yes [provider]  tiZANidine (ZANAFLEX) 4 MG tablet Take 4 mg by mouth 2 (two) times daily. 10/03/17  Yes [provider]  levothyroxine (SYNTHROID) 125 MCG tablet Take 1 tablet (125 mcg total) by mouth daily before breakfast. Patient not taking: Reported on 12/07/2017 05/11/13   Kelvin Cellar, MD  oxyCODONE-acetaminophen (ROXICET) 5-325 MG per tablet Take 1 tablet by mouth every 4 (four) hours as needed for severe pain. Patient not taking: Reported on 12/07/2017 08/19/14   Gaynelle Arabian,  Sigmund, MD  traMADol-acetaminophen (ULTRACET) 37.5-325 MG per tablet Take 1 tablet by mouth every 6 (six) hours as needed. Patient not taking: Reported on 08/19/2014 04/09/14   Carolan Clines, MD    Current Facility-Administered Medications  Medication Dose Route Frequency Provider Last Rate Last Dose  . chlorhexidine gluconate (MEDLINE KIT) (PERIDEX) 0.12 % solution 15 mL  15 mL Mouth Rinse BID Deterding, Guadelupe Sabin, MD   15 mL at 12/08/17 0012  . cyclobenzaprine (FLEXERIL) tablet 5 mg  5 mg Oral TID WC Garba, Mohammad L, MD      . diphenhydrAMINE (BENADRYL) capsule 50 mg  50 mg Oral Q6H PRN Jonelle Sidle, Mohammad L, MD      . docusate sodium (COLACE) capsule 100 mg  100 mg Oral Daily PRN Jonelle Sidle, Mohammad L, MD      . enoxaparin (LOVENOX) injection 40 mg  40 mg  Subcutaneous QHS Gala Romney L, MD   40 mg at 12/08/17 0009  . famotidine (PEPCID) IVPB 20 mg premix  20 mg Intravenous Q12H Deterding, Guadelupe Sabin, MD 100 mL/hr at 12/08/17 0010 20 mg at 12/08/17 0010  . fentaNYL (SUBLIMAZE) bolus via infusion 25 mcg  25 mcg Intravenous Q1H PRN Deterding, Guadelupe Sabin, MD      . fentaNYL 2552mg in NS 2511m(1072mml) infusion-PREMIX  25-400 mcg/hr Intravenous Continuous Deterding, EliGuadelupe SabinD 7.5 mL/hr at 12/08/17 0017 75 mcg/hr at 12/08/17 0017  . gabapentin (NEURONTIN) capsule 800 mg  800 mg Oral QID GarGala Romney MD      . meclizine (ANTIVERT) tablet 25 mg  25 mg Oral TID PRN GarElwyn ReachD      . MEDLINE mouth rinse  15 mL Mouth Rinse 10 times per day Deterding, EliGuadelupe SabinD      . midazolam (VERSED) injection 1 mg  1 mg Intravenous Q15 min PRN Deterding, EliGuadelupe SabinD      . midazolam (VERSED) injection 1 mg  1 mg Intravenous Q2H PRN Deterding, EliGuadelupe SabinD      . nystatin (MYCOSTATIN/NYSTOP) topical powder 1 Bottle  1 Bottle Topical BID GarElwyn ReachD   1 Bottle at 12/08/17 0010  . nystatin cream (MYCOSTATIN) 1 application  1 application Topical PRN GarGala Romney MD      . ondansetron (ZOFRAN) tablet 4 mg  4 mg Oral Q6H PRN GarElwyn ReachD       Or  . ondansetron (ZOFRAN) injection 4 mg  4 mg Intravenous Q6H PRN GarElwyn ReachD      . simethicone (MYLICON) chewable tablet 80 mg  80 mg Oral QID PRN GarElwyn ReachD         VITAL SIGNS: BP (!) 156/107   Pulse 83   Temp 98 F (36.7 C)   Resp (!) 21   Ht 5' 2"  (1.575 m)   Wt 74.8 kg (165 lb)   SpO2 100%   BMI 30.18 kg/m   HEMODYNAMICS:    VENTILATOR SETTINGS: Vent Mode: PRVC FiO2 (%):  [50 %] 50 % Set Rate:  [15 bmp] 15 bmp Vt Set:  [400 mL-500 mL] 400 mL PEEP:  [5 cmH20] 5 cmH20 Plateau Pressure:  [18 cmH20] 18 cmH20  INTAKE / OUTPUT: No intake/output data recorded.  PHYSICAL EXAMINATION: GENERAL: Intubated. Pleasant. Alert,  awake. Cooperative. No acute distress. HEAD: normocephalic, atraumatic EYE: OD blindness (secondary to MS). PERRLA, EOM intact, no scleral icterus, no pallor. NOSE: nares are patent. No exudate. THROAT/ORAL CAVITY:  Normal dentition. Mucous membranes are dry. ETT in situ.  NECK: supple, no thyromegaly, no JVD, no lymphadenopathy. Trachea midline. CHEST/LUNG: symmetric in development and expansion. Good air entry. no crackles. No wheezes. HEART: Regular S1 and S2 without murmur, rub or gallop. ABDOMEN: soft, nontender, nondistended. Normoactive bowel sounds. No rebound. No guarding. No hepatosplenomegaly. EXTREMITIES: Edema: none. No cyanosis. No clubbing. 2+ DP pulses. B foot drop. LYMPHATIC: no cervical/axiallary/inguinal lymph nodes appreciated MUSCULOSKELETAL: No point tenderness. No bulk atrophy.  SKIN:  No rash or lesion. NEUROLOGIC: Facial symmetry.  Spontaneous respirations intact. Cranial nerves II-XII are grossly symmetric and physiologic.No sensory deficit. Motor: 5/5 @ RUE, 5/5 @ LUE, 0/5 @ RLL,  0/5 @ LLL.    LABS:  ABG No results for input(s): PHART, PCO2ART, PO2ART in the last 168 hours.  Cardiac Enzymes No results for input(s): TROPONINI, PROBNP in the last 168 hours.  BASIC METABOLIC PROFILE No results for input(s): NA, K, CL, CO2, BUN, CREATININE, GLUCOSE, CALCIUM, MG, PHOS in the last 168 hours.  Glucose Recent Labs  Lab 12/07/17 2300  GLUCAP 104*    Liver Enzymes No results for input(s): AST, ALT, ALKPHOS, BILITOT, ALBUMIN in the last 168 hours.  CBC No results for input(s): WBC, HGB, HCT, PLT in the last 168 hours.  COAGULATION STUDIES No results for input(s): APTT, INR in the last 168 hours.  SEPSIS MARKERS No results for input(s): LATICACIDVEN, PROCALCITON, O2SATVEN in the last 168 hours.  CULTURES: Results for orders placed or performed during the hospital encounter of 07/29/14  Urine culture     Status: None   Collection Time: 07/29/14  7:31 PM    Result Value Ref Range Status   Specimen Description URINE, CLEAN CATCH  Final   Special Requests NONE  Final   Colony Count   Final    >=100,000 COLONIES/ML Performed at [facility]    Culture   Final    Multiple bacterial morphotypes present, none predominant. Suggest appropriate recollection if clinically indicated. Performed at [facility]    Report Status 07/30/2014 FINAL  Final     IMAGING: Dg Chest 2 View  Result Date: 12/07/2017 CLINICAL DATA:  Choking on food tonight. EXAM: CHEST - 2 VIEW COMPARISON:  February 20, 2014 FINDINGS: The mediastinal contour is normal. Heart size is mildly enlarged. Both lungs are clear. The visualized skeletal structures are stable. IMPRESSION: No active cardiopulmonary disease. Electronically Signed   By: Abelardo Diesel M.D.   On: 12/07/2017 21:16   Portable Chest X-ray  Result Date: 12/08/2017 CLINICAL DATA:  Endotracheal tube placement. EXAM: PORTABLE CHEST 1 VIEW COMPARISON:  Chest radiograph performed earlier today at 8:53 p.m. FINDINGS: The patient's endotracheal tube is seen ending 2-3 cm above the carina. The lungs are well-aerated. Minimal left basilar atelectasis is noted. There is no evidence of pleural effusion or pneumothorax. The lung apices are partially obscured by the patient's head. The cardiomediastinal silhouette is mildly enlarged. No acute osseous abnormalities are seen. IMPRESSION: 1. Endotracheal tube seen ending 2-3 cm above the carina. 2. Minimal left basilar atelectasis noted. Lungs otherwise clear. 3. Mild cardiomegaly. Electronically Signed   By: Garald Balding M.D.   On: 12/08/2017 00:33    ANTIBIOTICS: 6/9: Start meropenem/vancomycin (PCN allergy, quinolone allergy) for aspiration coverage  SIGNIFICANT EVENTS: 6/8: C/C aspiration. Saved by Heimlich maneuver. Endoscopy for possible esophageal impaction, negative.  LINES/TUBES: 6/8: intubated for endoscopy   ASSESSMENT / PLAN: Principal Problem:    Acute respiratory failure with hypoxia ([place])  Active Problems:   Aspiration pneumonitis (HCC)   Multiple sclerosis (HCC)   GERD (gastroesophageal reflux disease)   Hypothyroidism   DM type 2 (diabetes mellitus, type 2) (HCC)   Sacral decubitus ulcer, stage II   Bedridden   PULMONARY  ACUTE HYPOXEMIC RESPIRATORY FAILURE  ASPIRATION/NEAR ASPHYXIATION  ASPIRATION PNEUMONIA Wean vent to extubate.  Will check ABG after 30 min on PSV 10/5. HOB 30+ degrees at all times. Check BNP. NPO pending swallow evaluation Meropenem/vancomycin for now with coverage for aspiration. Check procalcitonin. If negative, I would limit use of antibiotics to 7 days at most. Trach aspirate for Gram stain, C/S. Please note: trach aspirate was obtained PRIOR to antibiotic exposure. Pulmonary toilet. Incentive spirometry.  ENDOCRINE  HYPOTHYROIDISM  TYPE 2 DIABETES MELLITUS Check TSH. Defer management to attending physician.  INFECTIOUS  SACRAL DECUBITUS, stage II: present on admission Wound care  NEUROLOGIC  MULTIPLE SCLEROSIS Apparently not on maintenance/suppression therapy.  GASTROINTESTINAL  GI PROPHYLAXIS: famotidine  HEMATOLOGIC  THROMBOCYTOSIS, likely reactive  DVT PROPHYLAXIS: Lovenox   FAMILY  - Updates: no family at bedside - Inter-disciplinary family meet or Palliative Care meeting due by: 12/14/2017  Critical care time: 60 minutes.  The treatment and management of the patient's condition was required based on the threat of imminent deterioration. This time reflects time spent by the physician evaluating, providing care and managing the critically ill patient's care. The time was spent at the immediate bedside (or on the same floor/unit and dedicated to this patient's care). Time involved in separately billable procedures is NOT included int he critical care time indicated above. Family meeting and update time may be included above if and only if the patient is  unable/incompetent to participate in clinical interview and/or decision making, and the discussion was necessary to determining treatment decisions.   Renee Pain, MD Board Certified by the ABIM, [place] Pager: [phone]  12/08/2017, 1:11 AM

## 2017-12-08 NOTE — Evaluation (Signed)
Clinical/Bedside Swallow Evaluation Patient Details  Name: [name] MRN: [id number] Date of Birth: [birth year]  Today's Date: 12/08/2017 Time: SLP Start Time (ACUTE ONLY): 1055 SLP Stop Time (ACUTE ONLY): 1114 SLP Time Calculation (min) (ACUTE ONLY): 19 min  Past Medical History:  Past Medical History:  Diagnosis Date  . Anxiety   . Arthritis    old falls-with injuries to rt knee and both ankles and rt wrist--states she has some pain in these areas-probable arthritis  . Arthritis    "hands" (09/03/2013)     Hoyer Lift used for transrers  . Bedridden 09/02/2013  . Blindness of right eye    complication of ms  . Cellulitis    hx of cellulitis in lower extremities -requiring multiple hospitalzations in the past--no problems in las 2 yrs  . GERD (gastroesophageal reflux disease)    nexium takes care  . High cholesterol   . Hypothyroidism   . Multiple sclerosis (HCC)    pt is bedridden -does get up to recliner by hoyer lift; mentally alert, able to feed herself -no trouble swallowing. lives at home with husband and has 24 hour caregivers.  . Neuromuscular disorder ([facility])    ms-not ambulatory-muscle spasms-and recent tremors left arm and left leg have developed Dr.--. Daphane Shepherd in [place] is pt's neurologist  . Thyroid disease   . Type II diabetes mellitus (HCC)    diet control - no longer takes any diabetic meds  . Urinary incontinence    neurogenic bladder--hx of uti's  . UTI (lower urinary tract infection)    Past Surgical History:  Past Surgical History:  Procedure Laterality Date  . ADENOIDECTOMY    . CHOLECYSTECTOMY    . CYSTOSCOPY  07/02/2011   Procedure: CYSTOSCOPY;  Surgeon: Kathi Ludwig, MD;  Location: [facility];  Service: Urology;  Laterality: N/A;  . FRACTURE SURGERY     Left Ankle  . INSERTION OF SUPRAPUBIC CATHETER N/A 01/15/2014   Procedure: CYSTOSCOPY WITH BOTOX INJECTION/SUPRAPUBIC TUBE PLACEMENT;  Surgeon: Kathi Ludwig, MD;  Location: [facility];   Service: Urology;  Laterality: N/A;  . INSERTION OF SUPRAPUBIC CATHETER N/A 04/09/2014   Procedure: SUPRAPUBIC TUBE PLACEMENT ;  Surgeon: Kathi Ludwig, MD;  Location: [facility];  Service: Urology;  Laterality: N/A;  . INSERTION OF SUPRAPUBIC CATHETER N/A 08/19/2014   Procedure: PLACEMENT OF SUPRAPUBIC TUBE ,cystoscopy, control of bleeding;  Surgeon: Kathi Ludwig, MD;  Location: [facility];  Service: Urology;  Laterality: N/A;  . LYMPH GLAND EXCISION    . MEDIASTINOSCOPY  04/24/10   for enlarged right paratracheal lymph node--surgery at [facility]-Dr. Burney--pt states she does not know the  what the pathology report showed.  . ORIF ANKLE FRACTURE Left 2005  . TONSILLECTOMY    . WRIST SURGERY Right    HPI:  67 y.o. female with medical history significant of multiple sclerosis, GERD, sacral decubitus ulcer with bedridden status, recurring UTI and diabetes who was apparently eating spaghetti with green beans tonight when she suddenly choked.  Heimlick's maneuver was performed but patient remained short of breath and hypoxic.  EMS was called and patient was brought to the emergency room.  On arrival in the ER patient was found to have oxygen sats in the 80s.  She was struggling to breathe.  Patient was therefore admitted with possible aspiration pneumonitis.  She is scheduled at this point to have GI intervention.  Patient is fully awake and alert. Pt intubated briefly for EGD for removal of residual  food from esophagus; extubated 12/08/17 at 1:38 am; pt denies any dysphagia prior to this incident.  Assessment / Plan / Recommendation Clinical Impression   Pt observed with various consistencies including thin liquids, puree, ice chips and soft solids without overt s/s of aspiration noted throughout BSE; pt c/o pain (2/10) in pharynx during intake and impaired mastication d/t edentulous state; pt does not wear dentures for oral intake at home, only for cosmetic purposes.  Pt with low vocal intensity and  weak cough during OME, but able to initiate a stronger cough prn with prompting.  Recommend initiating a diet of Dysphagia 3 (mechanical/soft)/thin liquids with general swallowing precautions in place; ST will f/u while in acute setting for diet tolerance.  Thank you for this consult. SLP Visit Diagnosis: Dysphagia, unspecified (R13.10)    Aspiration Risk  Mild aspiration risk    Diet Recommendation   Dysphagia 3 (mechanical/soft)/thin liquids  Medication Administration: Other (Comment)(Whole meds with liquids; one at a time)    Other  Recommendations Oral Care Recommendations: Oral care BID   Follow up Recommendations None      Frequency and Duration min 2x/week  1 week       Prognosis Prognosis for Safe Diet Advancement: Good      Swallow Study   General Date of Onset: 12/07/17 HPI: 67 y.o. female with medical history significant of multiple sclerosis, GERD, sacral decubitus ulcer with bedridden status, recurring UTI and diabetes who was apparently eating spaghetti with green beans tonight when she suddenly choked.  Heimlick's maneuver was performed but patient remained short of breath and hypoxic.  EMS was called and patient was brought to the emergency room.  On arrival in the ER patient was found to have oxygen sats in the 80s.  She was struggling to breathe.  Patient was therefore admitted with possible aspiration pneumonitis.  She is scheduled at this point to have GI intervention.  Patient is fully awake and alert. Type of Study: Bedside Swallow Evaluation Diet Prior to this Study: NPO Temperature Spikes Noted: Yes Respiratory Status: Nasal cannula History of Recent Intubation: Yes Length of Intubations (days): 1 days Date extubated: 12/08/17 Behavior/Cognition: Alert;Cooperative;Pleasant mood Oral Cavity Assessment: Within Functional Limits Oral Care Completed by SLP: Recent completion by staff Oral Cavity - Dentition: Edentulous Vision: Functional for  self-feeding Self-Feeding Abilities: Able to feed self;Needs assist Patient Positioning: Upright in bed Baseline Vocal Quality: Low vocal intensity Volitional Cough: Weak Volitional Swallow: Able to elicit    Oral/Motor/Sensory Function Overall Oral Motor/Sensory Function: Within functional limits   Ice Chips Ice chips: Within functional limits Presentation: Spoon   Thin Liquid Thin Liquid: Within functional limits Presentation: Cup;Spoon;Straw    Nectar Thick Nectar Thick Liquid: Not tested   Honey Thick Honey Thick Liquid: Not tested   Puree Puree: Within functional limits Presentation: Self Fed   Solid      Solid: Impaired Presentation: Self Fed Oral Phase Impairments: Impaired mastication Oral Phase Functional Implications: Impaired mastication        Tressie Stalker, M.S., CCC-SLP 12/08/2017,11:26 AM

## 2017-12-08 NOTE — Progress Notes (Signed)
Pharmacy Antibiotic Note  [name] is a 67 y.o. female admitted on 12/07/2017 with pneumonia/immunocompromised .  Pharmacy has been consulted for Vancomycin dosing.  Plan: Vancomycin 1500mg  iv x1, then 1250mg  iv q36hr  Goal AUC = 400 - 500 for all indications, except meningitis (goal AUC > 500 and Cmin 15-20 mcg/mL)   Height: 5\' 2"  (157.5 cm) Weight: 165 lb (74.8 kg) IBW/kg (Calculated) : 50.1  Temp (24hrs), Avg:97.7 F (36.5 C), Min:97 F (36.1 C), Max:98 F (36.7 C)  Recent Labs  Lab 12/08/17 0024  WBC 14.9*  CREATININE 0.56    Estimated Creatinine Clearance: 65.5 mL/min (by C-G formula based on SCr of 0.56 mg/dL).    Allergies  Allergen Reactions  . Avelox [Moxifloxacin Hcl In Nacl] Hives and Other (See Comments)    Panic attack  . Ciprofloxacin Hcl Hives and Other (See Comments)    Panic attack  . Penicillins Hives    Has patient had a PCN reaction causing immediate rash, facial/tongue/throat swelling, SOB or lightheadedness with hypotension: Unknown Has patient had a PCN reaction causing severe rash involving mucus membranes or skin necrosis: Unknown Has patient had a PCN reaction that required hospitalization: Unknown Has patient had a PCN reaction occurring within the last 10 years: Unknown If all of the above answers are "NO", then may proceed with Cephalosporin use.   . Shrimp [Shellfish Allergy] Other (See Comments)    flushing    Antimicrobials this admission: Vancomycin 12/08/2017 >> Meropenem 12/08/2017 >>   Dose adjustments this admission: -  Microbiology results: -  Thank you for allowing pharmacy to be a part of this patient's care.  [name] 12/08/2017 1:20 AM

## 2017-12-08 NOTE — Procedures (Signed)
Extubation Procedure Note  Patient Details:   Name: [name] DOB: 08/01/1950 MRN: [id number]   Airway Documentation:    Vent end date: 12/08/17 Vent end time: 0127   Evaluation  O2 sats: stable throughout Complications: No apparent complications Patient did tolerate procedure well. Bilateral Breath Sounds: Diminished   Yes  Rashaan Wyles P 12/08/2017, 1:38 AM

## 2017-12-08 NOTE — Progress Notes (Signed)
Pt's cousin is at bedside visiting, she took this RN aside and stated that she is worried about pt because she lives in unsafe, poor conditions at home. She states pt, who is bed bound with no wheelchair or lift, lives with her spouse who has severe dementia and her 66 yr old father who is also bed bound. Cousin states that house could probably be condemned and that they have private caregivers that are not around for hours in the day and go home in the evening. She states she is worried that if the house caught on fire they would not be able to escape. Pt did tell this RN that the caregivers have not been giving her synthroid or flexeril and she is not sure why. Pt's T4 was low. Cousin states that pt's brother who lives in [place] is her health care power of attorney but he never visits his sister or answers the phone when family call him to complain about pt's living conditions. This RN paged MD to get order for social worker.

## 2017-12-08 NOTE — Progress Notes (Signed)
Pt has green tinged urine with lots of sediment and odor. Pt has chronic supra pubic and states she is not sure when it was changed the last time at home. Received order from Dr Marland Mcalpine to have catheter changed and to send a UC and UA. Spoke with RN on 4th floor that states she is checked off on changing supra pubic catheters and she will come down to 2W and change it when she has sometime.

## 2017-12-08 NOTE — Progress Notes (Signed)
Pt placed on CPAP mode at 0020 by critical care physician. RT and charge nurse made aware. Possible extubation.

## 2017-12-08 NOTE — Progress Notes (Signed)
PROGRESS NOTE    [name]  LFY:[id number] DOB: 1950/11/08 DOA: 12/07/2017 PCP: Alvester Chou, NP   Brief Narrative:  [name] is a 67 y.o. female with medical history significant of multiple sclerosis, GERD, sacral decubitus ulcer with bedridden status, recurring UTI and diabetes Mellitus, Hypothyroidism and other comorbids who was apparently eating spaghetti with green beans tonight when she suddenly choked.  Heimlick's maneuver was performed but patient remained short of breath and hypoxic.  MS was called and patient was brought to the emergency room.  On arrival in the ER patient was found to have oxygen sats in the 80s.  She was struggling to breathe.  Patient was therefore admitted with possible aspiration pneumonitis and placed on NRB. She was intubated for EGD and for removal of residual food particles and was Extubated early this AM. She is improving but likely has a UTI and is to have her chronic suprapubic catheter changed.   Assessment & Plan:   Principal Problem:   Acute respiratory failure with hypoxia (HCC) Active Problems:   Multiple sclerosis (HCC)   GERD (gastroesophageal reflux disease)   Hypothyroidism   DM type 2 (diabetes mellitus, type 2) (HCC)   Sacral decubitus ulcer, stage II   Bedridden   Aspiration pneumonitis (HCC)  Acute Respiratory Failure with Hypoxia From Aspiration/ Aspiration Pneumonitis/PNA requiring Short Term Mechanical Ventilation for Procedure s/p Extubation -S/p Extubation early this AM -Does Not wear O2 at Home -Pulmonary Hygiene and Pulmonary Toilet  -C/w supplemental oxygen via nasal cannula and wean as tolerated -Continuous pulse oximetry and maintain O2 saturations greater than 92% -Continue with Flutter valve and Incentive Spirometer -Oral care with chlorhexidine 15 mL's mouth rinse twice daily as well as Medline mouth rinse 15 mL's per oral care protocol -Repeat chest x-ray in a.m. -Since patient has been extubated we will  DC her Fentanyl and her Midazolam  -Continue with IV vancomycin and IV meropenem for now given her multiple penicillin and quinolone allergies  Aspiration Pneumonitis/Aspiration PNA -Patient undewent EGD with possible removal of full debris. EGD report showed: Benign-appearing esophageal stenosis. Food at the cricopharyngeus and removal was successful. A medium amount of food (residue) in the stomach. Retained food in the duodenum. -She was expected to be intubated for procedure in the OR.   -Postoperatively Antibiotics were initiated with IV Vancomycin and IV Meropenem  -Added DuoNeb 3 mL q6hprn for Wheezing and SOB -Pulmonary Hygiene and Pulmonary Toilet  -C/w supplemental oxygen via nasal cannula and wean as tolerated -Continuous pulse oximetry and maintain O2 saturations greater than 92% -Continue with Flutter valve and Incentive Spirometer -Oral care with chlorhexidine 15 mL's mouth rinse twice daily as well as Medline mouth rinse 15 mL's per oral care protocol -Procalcitonin being Checked and per Pulmonary if Negative would limit use of Abx to 7 days at most   -SLP evaluated and patient on a Dysphagia 3 Diet (Mechanical Soft) with Thin Liquids  -Repeat CXR in AM -Trach Respiratory Cx Sent for Gram Stain and Culture and was obtained prior to Abx Exposure -Patient had a slight Temperature last night and WBC went from 14.9 -> 23.9 -Continue Abx as above and Repeat CBC in AM   Multiple Sclerosis -Appears to be at baseline.   -Patient likely may have aspirated due to progressive disease.   -Not on any maintenance or suppressive therapy currently  -Continue Supportive Therapies and Home Medications including: Cyclobenzaprine 5 mg p.o. 3 times daily with meals, gabapentin 800 mg p.o. 4  times daily, and tizanidine 4 mg p.o. twice daily -Pain control with oxycodone acetaminophen 5-_0 tab every 4 hours as needed for severe pain and tramadol-acetaminophen 37.5-35 mg/tab. 1 tablet p.o. every  6 as needed as needed  GERD/GI Prophylaxis  -C/w IV Famotidine 20 mg q12h and change to po in AM  Diabetes Mellitus Type 2  -Hold Home Metformin -Start Patient on Sensitive Novolog SSI AC  Hypothyroidism -? Of Medication Compliance and it was found that patient was not taking due to preference  -Patient's TSH was 38.484 and T4 was 0.76; Free T3 pending -Restart Patient's Home Levothyroxine at 125 mcg po Daily -Will need Repeat TSH and Thyroid studies in 4-6 weeks   Suspected Catheter Associated UTI  -Cannot remember when catheter was changed -Catheter has green discharge and is very foul-smelling -Have asked nurse to change Foley catheter and send urinalysis and urine culture -Continue Empiric antibiotics as above -Continue to follow urine culture and urinalysis  Sacral Decubitus Ulcer -WOC Nurse Consulted for Wound care in the hospital  Thrombocytosis -Likely Reactive to Above -Patient's Platelet Count went from 498 -> 546 -Continue to Trend and Repeat CBC in AM   DVT prophylaxis: Enoxaparin 40 mg sq q24h Code Status: FULL CODE Family Communication: No family present at bedside  Disposition Plan: Remain in SDU today and transfer to Medical Floor in AM if improved.  Consultants:  Gastroenterology Dr. Paulita Fujita PCCM/Pulmonary Dr. Carson Myrtle  Procedures:  EGD Findings:      One benign-appearing, intrinsic moderate stenosis was found at level of       upper esophageal stricture.      Food was found at the cricopharyngeus. Removal of food was accomplished.       Estimated blood loss: none.      The exam of the esophagus was otherwise normal.      A medium amount of food (residue) was found in the cardia, in the       gastric fundus and in the gastric body.      With limitations of mucosal evaluation given retained gastric contents,       the exam of the stomach was otherwise normal.      Food (residue) was found in the duodenal bulb, precluding good views of       this  area. Impression:               - Benign-appearing esophageal stenosis.                           - Food at the cricopharyngeus. Removal was                            successful.                           - A medium amount of food (residue) in the stomach.                           - Retained food in the duodenum.   Antimicrobials:  Anti-infectives (From admission, onward)   Start     Dose/Rate Route Frequency Ordered Stop   12/09/17 1200  vancomycin (VANCOCIN) 1,250 mg in sodium chloride 0.9 % 250 mL IVPB     1,250 mg 166.7 mL/hr over 90 Minutes Intravenous Every 36 hours  12/08/17 0119     12/08/17 0130  vancomycin (VANCOCIN) 1,500 mg in sodium chloride 0.9 % 500 mL IVPB     1,500 mg 250 mL/hr over 120 Minutes Intravenous  Once 12/08/17 0119 12/08/17 0445   12/08/17 0115  meropenem (MERREM) 1 g in sodium chloride 0.9 % 100 mL IVPB     1 g 200 mL/hr over 30 Minutes Intravenous Every 8 hours 12/08/17 0109       Subjective: Seen and examined at bedside and states she is doing okay.  States she choked on spaghetti.  Denies any chest pain, nausea, or vomiting, states she is slightly short of breath.  No other complaints or concerns at this time does not know when her chronic suprapubic catheter was last changed.   Objective: Vitals:   12/08/17 0200 12/08/17 0343 12/08/17 0400 12/08/17 0500  BP: (!) 168/68  (!) 152/48 (!) 144/55  Pulse: 91  91 87  Resp: _0 Temp:  (!) 100.4 F (38 C)    TempSrc:  Oral    SpO2: 96%  97% 97%  Weight:      Height:        Intake/Output Summary (Last 24 hours) at 12/08/2017 0752 Last data filed at 12/08/2017 0500 Gross per 24 hour  Intake 1600 ml  Output 450 ml  Net 1150 ml   Filed Weights   12/07/17 1947  Weight: 74.8 kg (165 lb)   Examination: Physical Exam:  Constitutional: WN/WD obese Caucasian female inNAD and appears calm Eyes: Lids and conjunctivae normal, sclerae anicteric  ENMT: External Ears, Nose appear normal. Grossly  normal hearing. Neck: Appears normal, supple, no cervical masses, normal ROM, no appreciable thyromegaly; no JVD Respiratory: Diminished to auscultation bilaterally, no wheezing, rales, rhonchi or crackles. Normal respiratory effort and patient is not tachypenic. No accessory muscle use. Wearing Supplemental O2 via Maria Antonia Cardiovascular: RRR, no murmurs / rubs / gallops. S1 and S2 auscultated. No extremity edema Abdomen: Soft, non-tender, Distended. No masses palpated. No appreciable hepatosplenomegaly. Bowel sounds positive x4.  GU: Deferred. Has a chronic indwelling suprapubic catheter with skin breakdown around insertion site and foul smelling greenish tinged urine with purulent material noted Musculoskeletal: No clubbing / cyanosis of digits/nails. No joint deformity upper and lower extremities.  Skin: Has a sacral decubitus. No rashes, lesions, ulcers. No induration; Warm and dry.  Neurologic: CN 2-12 grossly intact with no focal deficits.  Romberg sign and cerebellar reflexes not assessed.  Psychiatric: Normal judgment and insight. Alert and oriented x 3. Normal mood and appropriate affect.   Data Reviewed: I have personally reviewed following labs and imaging studies  CBC: Recent Labs  Lab 12/08/17 0024 12/08/17 0329  WBC 14.9* 23.8*  HGB 15.2* 15.3*  HCT 46.0 45.9  MCV 95.4 95.6  PLT 498* 371*   Basic Metabolic Panel: Recent Labs  Lab 12/08/17 0024 12/08/17 0329  NA  --  143  K  --  3.6  CL  --  106  CO2  --  27  GLUCOSE  --  138*  BUN  --  12  CREATININE 0.56 0.60  CALCIUM  --  8.7*   GFR: Estimated Creatinine Clearance: 65.5 mL/min (by C-G formula based on SCr of 0.6 mg/dL). Liver Function Tests: Recent Labs  Lab 12/08/17 0329  AST 17  ALT 12*  ALKPHOS 98  BILITOT 1.1  PROT 7.1  ALBUMIN 3.6   No results for input(s): LIPASE, AMYLASE in the last 168 hours. No  results for input(s): AMMONIA in the last 168 hours. Coagulation Profile: No results for input(s):  INR, PROTIME in the last 168 hours. Cardiac Enzymes: No results for input(s): CKTOTAL, CKMB, CKMBINDEX, TROPONINI in the last 168 hours. BNP (last 3 results) No results for input(s): PROBNP in the last 8760 hours. HbA1C: No results for input(s): HGBA1C in the last 72 hours. CBG: Recent Labs  Lab 12/07/17 2300  GLUCAP 104*   Lipid Profile: No results for input(s): CHOL, HDL, LDLCALC, TRIG, CHOLHDL, LDLDIRECT in the last 72 hours. Thyroid Function Tests: Recent Labs    12/08/17 0023  TSH 38.484*   Anemia Panel: No results for input(s): VITAMINB12, FOLATE, FERRITIN, TIBC, IRON, RETICCTPCT in the last 72 hours. Sepsis Labs: No results for input(s): PROCALCITON, LATICACIDVEN in the last 168 hours.  No results found for this or any previous visit (from the past 240 hour(s)).   Radiology Studies: Dg Chest 2 View  Result Date: 12/07/2017 CLINICAL DATA:  Choking on food tonight. EXAM: CHEST - 2 VIEW COMPARISON:  February 20, 2014 FINDINGS: The mediastinal contour is normal. Heart size is mildly enlarged. Both lungs are clear. The visualized skeletal structures are stable. IMPRESSION: No active cardiopulmonary disease. Electronically Signed   By: Abelardo Diesel M.D.   On: 12/07/2017 21:16   Portable Chest X-ray  Result Date: 12/08/2017 CLINICAL DATA:  Endotracheal tube placement. EXAM: PORTABLE CHEST 1 VIEW COMPARISON:  Chest radiograph performed earlier today at 8:53 p.m. FINDINGS: The patient's endotracheal tube is seen ending 2-3 cm above the carina. The lungs are well-aerated. Minimal left basilar atelectasis is noted. There is no evidence of pleural effusion or pneumothorax. The lung apices are partially obscured by the patient's head. The cardiomediastinal silhouette is mildly enlarged. No acute osseous abnormalities are seen. IMPRESSION: 1. Endotracheal tube seen ending 2-3 cm above the carina. 2. Minimal left basilar atelectasis noted. Lungs otherwise clear. 3. Mild cardiomegaly.  Electronically Signed   By: Garald Balding M.D.   On: 12/08/2017 00:33   Scheduled Meds: . chlorhexidine gluconate (MEDLINE KIT)  15 mL Mouth Rinse BID  . cyclobenzaprine  5 mg Oral TID WC  . enoxaparin (LOVENOX) injection  40 mg Subcutaneous QHS  . gabapentin  800 mg Oral QID  . mouth rinse  15 mL Mouth Rinse 10 times per day  . nystatin  1 Bottle Topical BID   Continuous Infusions: . famotidine (PEPCID) IV Stopped (12/08/17 0033)  . fentaNYL infusion INTRAVENOUS Stopped (12/08/17 0020)  . meropenem (MERREM) IV 1 g (12/08/17 0558)  . [START ON 12/09/2017] vancomycin      LOS: 1 day   Kerney Elbe, DO [facility] Pager [phone]  If 7PM-7AM, please contact night-coverage [email or web address] Password [email or web address] 12/08/2017, 7:52 AM

## 2017-12-09 ENCOUNTER — Encounter (HOSPITAL_COMMUNITY): Payer: Self-pay | Admitting: Gastroenterology

## 2017-12-09 ENCOUNTER — Inpatient Hospital Stay (HOSPITAL_COMMUNITY): Payer: Medicare Other

## 2017-12-09 DIAGNOSIS — E876 Hypokalemia: Secondary | ICD-10-CM

## 2017-12-09 LAB — COMPREHENSIVE METABOLIC PANEL
ALK PHOS: 79 U/L (ref 38–126)
ALT: 12 U/L — ABNORMAL LOW (ref 14–54)
ANION GAP: 8 (ref 5–15)
AST: 20 U/L (ref 15–41)
Albumin: 2.9 g/dL — ABNORMAL LOW (ref 3.5–5.0)
BUN: 13 mg/dL (ref 6–20)
CALCIUM: 8.1 mg/dL — AB (ref 8.9–10.3)
CHLORIDE: 105 mmol/L (ref 101–111)
CO2: 26 mmol/L (ref 22–32)
Creatinine, Ser: 0.78 mg/dL (ref 0.44–1.00)
GFR calc non Af Amer: >60 mL/min (ref >60)
Glucose, Bld: 99 mg/dL (ref 65–99)
Potassium: 3.3 mmol/L — ABNORMAL LOW (ref 3.5–5.1)
SODIUM: 139 mmol/L (ref 135–145)
Total Bilirubin: 0.9 mg/dL (ref 0.3–1.2)
Total Protein: 6 g/dL — ABNORMAL LOW (ref 6.5–8.1)

## 2017-12-09 LAB — CBC WITH DIFFERENTIAL/PLATELET
Basophils Absolute: 0 10*3/uL (ref 0.0–0.1)
Basophils Relative: 0 %
EOS ABS: 0.4 10*3/uL (ref 0.0–0.7)
EOS PCT: 2 %
HCT: 40.3 % (ref 36.0–46.0)
Hemoglobin: 12.9 g/dL (ref 12.0–15.0)
LYMPHS ABS: 2.6 10*3/uL (ref 0.7–4.0)
Lymphocytes Relative: 14 %
MCH: 30.9 pg (ref 26.0–34.0)
MCHC: 32 g/dL (ref 30.0–36.0)
MCV: 96.4 fL (ref 78.0–100.0)
MONO ABS: 1.2 10*3/uL — AB (ref 0.1–1.0)
MONOS PCT: 7 %
Neutro Abs: 13.8 10*3/uL — ABNORMAL HIGH (ref 1.7–7.7)
Neutrophils Relative %: 77 %
PLATELETS: 483 10*3/uL — AB (ref 150–400)
RBC: 4.18 MIL/uL (ref 3.87–5.11)
RDW: 13.2 % (ref 11.5–15.5)
WBC: 18 10*3/uL — ABNORMAL HIGH (ref 4.0–10.5)

## 2017-12-09 LAB — GLUCOSE, CAPILLARY
GLUCOSE-CAPILLARY: 138 mg/dL — AB (ref 65–99)
Glucose-Capillary: 78 mg/dL (ref 65–99)
Glucose-Capillary: 82 mg/dL (ref 65–99)
Glucose-Capillary: 96 mg/dL (ref 65–99)

## 2017-12-09 LAB — PHOSPHORUS: PHOSPHORUS: 2.3 mg/dL — AB (ref 2.5–4.6)

## 2017-12-09 LAB — MAGNESIUM: Magnesium: 1.8 mg/dL (ref 1.7–2.4)

## 2017-12-09 LAB — T3, FREE: T3 FREE: 1.6 pg/mL — AB (ref 2.0–4.4)

## 2017-12-09 LAB — MRSA PCR SCREENING: MRSA BY PCR: NEGATIVE

## 2017-12-09 LAB — PROCALCITONIN: Procalcitonin: 1 ng/mL

## 2017-12-09 MED ORDER — K PHOS MONO-SOD PHOS DI & MONO 155-852-130 MG PO TABS
500.0000 mg | ORAL_TABLET | Freq: Two times a day (BID) | ORAL | Status: AC
  Administered 2017-12-09 (×2): 500 mg via ORAL
  Filled 2017-12-09 (×2): qty 2

## 2017-12-09 MED ORDER — FAMOTIDINE 20 MG PO TABS
20.0000 mg | ORAL_TABLET | Freq: Two times a day (BID) | ORAL | Status: DC
  Administered 2017-12-09 – 2017-12-11 (×4): 20 mg via ORAL
  Filled 2017-12-09 (×4): qty 1

## 2017-12-09 MED ORDER — POTASSIUM CHLORIDE 20 MEQ PO PACK
40.0000 meq | PACK | Freq: Once | ORAL | Status: AC
  Administered 2017-12-09: 40 meq via ORAL
  Filled 2017-12-09: qty 2

## 2017-12-09 NOTE — Progress Notes (Signed)
SLP to follow up with pt, note per GI MD notes after endoscopy with food disimpaction on 12/07/17, pt to remain NPO.  Note pt was placed on diet after clinical swallow evaluation on 12/08/17.  SLP phoned RN Shanda Bumps) and recommended pt be NPO until pt approved for po by GI given proximal esophageal stricture.   This SLP will follow up.  Thanks.   Donavan Burnet, MS [facility] SLP [phone]

## 2017-12-09 NOTE — Progress Notes (Signed)
Patient's caregiver just called this RN to state that the patient's brother who is POA is not always able to answer the phone due to his work but Haematologist and providers should leave a IT sales professional. The caregiver also stated that the brother has had a viral infection therefore he felt that it was not safe to visit patient in the hospital at this time.

## 2017-12-09 NOTE — Progress Notes (Signed)
67yo female with aspiration PNA and post procedure ventilator requirements in the setting of urgent overnight endoscopy 6/8 to r/o esophogeal impaction after choking event requiring Heimlich maneuver.  She was extubated 6/9 without difficulty.  Respiratory status remains stable.  Primary MD treating aspiration PNA and working with speech/GI for diet recommendations.  No hx of primary pulmonary issues.   No further critical care issues.  PCCM signing off, please call back if needed.   Dirk Dress, NP 12/09/2017  10:03 AM Pager: [phone] or [phone]

## 2017-12-09 NOTE — Consult Note (Signed)
WOC Nurse wound consult note Reason for Consult: Irritation around the suprapubic catheter insertion site, and sacral area.  Assessment performed in WL 1239. Wound type: The area around the suprapubic catheter insertion site has superficial irritation from drainage. Plan for this site is to cleanse with warm water and soap each shift.  Pat dry. Apply Criticaid Clear around insertion site. Place a split gauze dressing.  Perform each shift. POA: Yes There was a sacral foam dressing in place.  I lifted the dressing and did not find any skin irritation or breakdown to the sacral/coccyx/gluteal cleft area.  There was an area on the left upper thigh impacted by MASD.  There was already an order for nystatin powder.  The area appears to be drying up with the use of this product.  No new interventions needed for this site at this time. Monitor the wound area(s) for worsening of condition such as: Signs/symptoms of infection,  Increase in size,  Development of or worsening of odor, Development of pain, or increased pain at the affected locations.  Notify the medical team if any of these develop.  Thank you for the consult.  Discussed plan of care with the patient and bedside nurse.  WOC nurse will not follow at this time.  Please re-consult the WOC team if needed.  Helmut Muster, RN, MSN, CWOCN, CNS-BC, pager [phone]

## 2017-12-09 NOTE — Progress Notes (Addendum)
  Speech Language Pathology Treatment: Dysphagia  Patient Details Name: [name] MRN: [id number] DOB: August 26, 1950 Today's Date: 12/09/2017 Time: 1250-1305 SLP Time Calculation (min) (ACUTE ONLY): 15 min  Assessment / Plan / Recommendation Clinical Impression  Review of MD note indicates pt allowed po diet per GI.  SLP visit focused on education of aspiration/dyphagia mitigation strategies.  Pt denies ever having Heimlich maneuver nor aspirating prior to this most recent event.  She advised she became "choked" as soon as she started eating and was sitting upright.  Pt denies "choking" episode or problems swallowing since her endoscopy.  She reports eating full meals yesterday.  Pt observed consuming water today without indication of airway compromise.    This SLP recommends strict aspiration precautions given recent event.  Will follow up for indication of instrumental swallow evaluation given pt's neurological diagnosis and proximal esophageal stenosis.   Using teach back, pt educated to findings/recommendations.  SLP also questions if pt could have had a CP spasm that contributed to symptoms.    HPI HPI: 67 y.o. female with medical history significant of multiple sclerosis, GERD, sacral decubitus ulcer with bedridden status, recurring UTI and diabetes who was apparently eating spaghetti with green beans tonight when she suddenly choked.  Heimlick's maneuver was performed but patient remained short of breath and hypoxic.  EMS was called and patient was brought to the emergency room.  On arrival in the ER patient was found to have oxygen sats in the 80s.  She was struggling to breathe.  Patient was therefore admitted with possible aspiration pneumonitis.  Pt is s/p EGD with findings of stenosis at proximal esophagus with retained food removed.  GI had rec NPO until further notice per EGD documentation- but hospitalist followed up and GI approved diet order.  Follow up from SLP to assure tolerance  and recommend compensation strategies.       SLP Plan  Continue with current plan of care       Recommendations  Diet recommendations: Dysphagia 3 (mechanical soft);Thin liquid Liquids provided via: Straw;Cup Medication Administration: Other (Comment)(Whole meds with liquids; one at a time) Supervision: Patient able to self feed Compensations: Slow rate;Small sips/bites Postural Changes and/or Swallow Maneuvers: Upright 30-60 min after meal;Seated upright 90 degrees                Oral Care Recommendations: Oral care BID Follow up Recommendations: None SLP Visit Diagnosis: Dysphagia, pharyngoesophageal phase (R13.14) Plan: Continue with current plan of care       GO                Chales Abrahams 12/09/2017, 2:48 PM Donavan Burnet, MS [facility] SLP [phone]

## 2017-12-09 NOTE — Progress Notes (Signed)
Initial Nutrition Assessment  DOCUMENTATION CODES:   Obesity unspecified  INTERVENTION:   Provide Juven Fruit Punch , each serving provides 95kcal and 2.5g of protein (amino acids glutamine and arginine)  NUTRITION DIAGNOSIS:   Increased nutrient needs related to wound healing(s/p choking incident) as evidenced by estimated needs.  GOAL:   Patient will meet greater than or equal to 90% of their needs  MONITOR:   PO intake, Labs, Weight trends, I & O's, Skin  REASON FOR ASSESSMENT:   (Pressure ulcer report)    ASSESSMENT:   67 y.o. female with medical history significant of multiple sclerosis, GERD, sacral decubitus ulcer with bedridden status, recurring UTI and diabetes Mellitus, Hypothyroidism and other comorbids who was apparently eating spaghetti with green beans tonight when she suddenly choked.    Patient admitted d/t a choking incident on 6/8 when she ate some green beans. SLP has evaluated 6/9 and recommended dysphagia 3 diet. Pt edentulous. SLP today recommends keeping patient NPO until GI can evaluate.  Pt was consuming 75-80% of meals yesterday. Weight has remained stable.   Medications: K PHOS tablet BID, Miralax packet BID, Senokot-S tablet BID Labs reviewed: Low K, Phos Mg WNL   NUTRITION - FOCUSED PHYSICAL EXAM:  Deferred given history of MS. Patient is bedridden. Depletions are more likely related to this condition.  Diet Order:   Diet Order           DIET DYS 3 Room service appropriate? Yes; Fluid consistency: Thin  Diet effective now          EDUCATION NEEDS:   No education needs have been identified at this time  Skin:  Skin Assessment: Skin Integrity Issues: Skin Integrity Issues:: Stage III, Stage II Stage II: buttocks Stage III: abdomen  Last BM:  PTA  Height:   Ht Readings from Last 1 Encounters:  12/07/17 5\' 2"  (1.575 m)    Weight:   Wt Readings from Last 1 Encounters:  12/07/17 165 lb (74.8 kg)    Ideal Body Weight:  50  kg  BMI:  Body mass index is 30.18 kg/m.  Estimated Nutritional Needs:   Kcal:  1300-1500  Protein:  55-65g  Fluid:  1.5L/day  Tilda Franco, MS, RD, LDN [facility] Inpatient Clinical Dietitian Pager: [phone] After Hours Pager: [phone]

## 2017-12-09 NOTE — Progress Notes (Signed)
Patient transferred from ICU. Patient in no distress complaining of tenderness at previous R hand IV injection site. Ice pack applied to R hand. Telemetry box 59 placed on patient. VS obtained. Agree with previous RN assessment.

## 2017-12-09 NOTE — Progress Notes (Signed)
PROGRESS NOTE    KAYONA FOOR  NWG:[id number] DOB: 1951-02-19 DOA: 12/07/2017 PCP: [name], NP   Brief Narrative:  [name] is a 67 y.o. female with medical history significant of multiple sclerosis, GERD, sacral decubitus ulcer with bedridden status, recurring UTI and diabetes Mellitus, Hypothyroidism and other comorbids who was apparently eating spaghetti with green beans tonight when she suddenly choked.  Heimlick's maneuver was performed but patient remained short of breath and hypoxic.  MS was called and patient was brought to the emergency room.  On arrival in the ER patient was found to have oxygen sats in the 80s.  She was struggling to breathe.  Patient was therefore admitted with possible aspiration pneumonitis and placed on NRB. She was intubated for EGD and for removal of residual food particles and was Extubated early this AM. She is improving but likely has a UTI and is to have her chronic suprapubic catheter changed.  Patient was weaned off oxygen on room air and was doing well.  She is stable to be transferred to the medical floor.  Assessment & Plan:   Principal Problem:   Acute respiratory failure with hypoxia (HCC) Active Problems:   Multiple sclerosis (HCC)   GERD (gastroesophageal reflux disease)   Hypothyroidism   DM type 2 (diabetes mellitus, type 2) (HCC)   Sacral decubitus ulcer, stage II   Bedridden   Aspiration pneumonitis (HCC)  Acute Respiratory Failure with Hypoxia From Aspiration/ Aspiration Pneumonitis/PNA requiring Short Term Mechanical Ventilation for Procedure s/p Extubation, improved -S/p Extubation early 12/08/17 -Does Not wear O2 at Home -Pulmonary Hygiene and Pulmonary Toilet  -C/w supplemental oxygen via nasal cannula and wean as tolerated -Continuous pulse oximetry and maintain O2 saturations greater than 92% -Continue with Flutter valve and Incentive Spirometer -Oral care with chlorhexidine 15 mL's mouth rinse twice daily as well  as Medline mouth rinse 15 mL's per oral care protocol -Repeat CXR this AM showed Markedly improved aeration of the left lung base. Question tiny right pleural effusion. -Since patient has been extubated we will DC her Fentanyl and her Midazolam  -Continued with IV vancomycin and IV meropenem for now given her multiple penicillin and quinolone allergies; But will D/C IV Vancomycin today and continue IV Meropenem for now  -Patient was weaned off of oxygen and is now on nasal cannula will continue  Aspiration Pneumonitis/Aspiration PNA -Patient undewent EGD with possible removal of full debris. EGD report showed: Benign-appearing esophageal stenosis. Food at the cricopharyngeus and removal was successful. A medium amount of food (residue) in the stomach. Retained food in the duodenum. -She was expected to be intubated for procedure in the OR.   -Postoperatively Antibiotics were initiated with IV Vancomycin and IV Meropenem  -Added DuoNeb 3 mL q6hprn for Wheezing and SOB and will continue  -Pulmonary Hygiene and Pulmonary Toilet  -C/w supplemental oxygen via nasal cannula and wean as tolerated; now saturating on room air -Continuous pulse oximetry and maintain O2 saturations greater than 92% -Continue with Flutter valve and Incentive Spirometer -Oral care with chlorhexidine 15 mL's mouth rinse twice daily as well as Medline mouth rinse 15 mL's per oral care protocol -Procalcitonin being Checked and per Pulmonary if Negative would limit use of Abx to 7 days at most; Procalcitonin was 1.12 and trending down to 1.00 -SLP evaluated and patient on a Dysphagia 3 Diet (Mechanical Soft) with Thin Liquids; some confusion this morning because of GI documentation and her breakfast tray was removed however after my discussion  with Dr. Levora Angel in GI diet was resumed per SLP -Repeat CXR this AM showed improvement as above -Trach Respiratory Cx Sent for Gram Stain and Culture and was obtained prior to Abx  Exposure -Gram Stain Showed:  ABUNDANT WBC PRESENT, PREDOMINANTLY PMN  RARE SQUAMOUS EPITHELIAL CELLS PRESENT  ABUNDANT GRAM POSITIVE COCCI IN PAIRS  MODERATE GRAM NEGATIVE COCCOBACILLI  FEW GRAM POSITIVE RODS  -Cx Reincubated for better Growth  -Patient had a slight Temperature last night and WBC went from 14.9 -> 23.9 -> 18.0 -Continue Abx as above and Repeat CBC in AM   Multiple Sclerosis -Appears to be at baseline.   -Patient likely may have aspirated due to progressive disease.   -Not on any maintenance or suppressive therapy currently  -Continue Supportive Therapies and Home Medications including: Cyclobenzaprine 5 mg p.o. 3 times daily with meals, gabapentin 800 mg p.o. 4 times daily, and tizanidine 4 mg p.o. twice daily -Pain control with oxycodone acetaminophen 5-3 25 1  tab every 4 hours as needed for severe pain and tramadol-acetaminophen 37.5-35 mg/tab. 1 tablet p.o. every 6 as needed as needed  GERD/GI Prophylaxis  -C/w IV Famotidine 20 mg q12h now and will change to p.o.  Diabetes Mellitus Type 2  -Hold Home Metformin -Start Patient on Sensitive Novolog SSI AC -CBGs range from 78 -139  Hypothyroidism -? Of Medication Compliance and it was found that patient was not taking due to preference and that caregivers had not been giving the patient Synthroid  -Patient's TSH was 38.484 and T4 was 0.76; Free T3 pending -Restart Patient's Home Levothyroxine at 125 mcg po Daily -Will need Repeat TSH and Thyroid studies in 4-6 weeks   Suspected Catheter Associated UTI  -Cannot remember when catheter was changed -Catheter has green discharge and is very foul-smelling -Have asked nurse to change Foley catheter and send urinalysis and urine culture -Urinalysis showed cloudy urine with large Hgb, trace ketones, large leukocytes, rare bacteria, present amorphous crystals, and 21-50 WBCs -Urine cultures pending -Continue Empiric antibiotics as above and stop IV Vancomycin  -WBC  Trending down and is now 18.0 -Continue to follow urine culture and urinalysis  ? Sacral Decubitus Ulcer -On admission was thought to be stage 2 but upon further has no Breakdown -WOC Nurse Consulted for Wound care in the hospital -Upon evaluation no irritation or breakdown to the Sacral/Buttock Area -Appreciate WOC Nurse evaluation   Thrombocytosis -Likely Reactive to Above -Patient's Platelet Count went from 498 -> 546 -> 483 -Continue to Trend and Repeat CBC in AM   Hypokalemia -Patient's potassium this morning was 3.3 -Replete with p.o. potassium chloride packets 40 mEq x 1 as well as p.o. K-Phos Neutral 500 mg p.o. twice daily x2 doses -Continue to monitor and replete as necessary -Repeat CMP in the a.m.  Hypophosphatemia -Patient's phosphorus level this morning was 2.3 -Replete with K-Phos Neutral 500 mg p.o. twice daily x2 doses -Continue to monitor and replete as necessary -Repeat phosphorus level in the a.m.  DVT prophylaxis: Enoxaparin 40 mg sq q24h Code Status: FULL CODE Family Communication: No family present at bedside  Disposition Plan:Transfer to Medical Floor this AMas she has improved. Social Worker consulted for ? Unsafe living conditions   Consultants:  Gastroenterology Dr. Dulce Sellar PCCM/Pulmonary Dr. Ardeth Perfect  Procedures:  EGD Findings:      One benign-appearing, intrinsic moderate stenosis was found at level of       upper esophageal stricture.      Food was found at the cricopharyngeus. Removal  of food was accomplished.       Estimated blood loss: none.      The exam of the esophagus was otherwise normal.      A medium amount of food (residue) was found in the cardia, in the       gastric fundus and in the gastric body.      With limitations of mucosal evaluation given retained gastric contents,       the exam of the stomach was otherwise normal.      Food (residue) was found in the duodenal bulb, precluding good views of       this area. Impression:                - Benign-appearing esophageal stenosis.                           - Food at the cricopharyngeus. Removal was                            successful.                           - A medium amount of food (residue) in the stomach.                           - Retained food in the duodenum.   Antimicrobials:  Anti-infectives (From admission, onward)   Start     Dose/Rate Route Frequency Ordered Stop   12/09/17 1200  vancomycin (VANCOCIN) 1,250 mg in sodium chloride 0.9 % 250 mL IVPB     1,250 mg 166.7 mL/hr over 90 Minutes Intravenous Every 36 hours 12/08/17 0119     12/08/17 0130  vancomycin (VANCOCIN) 1,500 mg in sodium chloride 0.9 % 500 mL IVPB     1,500 mg 250 mL/hr over 120 Minutes Intravenous  Once 12/08/17 0119 12/08/17 0445   12/08/17 0115  meropenem (MERREM) 1 g in sodium chloride 0.9 % 100 mL IVPB     1 g 200 mL/hr over 30 Minutes Intravenous Every 8 hours 12/08/17 0109       Subjective: Seen and examined at bedside was doing well.  Was weaned off of nasal cannula and saturating well on room air.  Was confused about her breakfast tray being taken away by nurse because of confusion caused by GIs note.  Now able to eat and will be placed back on her diet previously by speech.  No chest pain, shortness breath, nausea, vomiting this time.  Stable to be transferred to the medical floor  Objective: Vitals:   12/09/17 0207 12/09/17 0355 12/09/17 0400 12/09/17 0600  BP: (!) 102/41  (!) 103/45 (!) 100/49  Pulse: 81  86 87  Resp: 16  15 16   Temp:  98.4 F (36.9 C)    TempSrc:  Oral    SpO2: 90%  93% 91%  Weight:      Height:        Intake/Output Summary (Last 24 hours) at 12/09/2017 0753 Last data filed at 12/09/2017 1610 Gross per 24 hour  Intake 1510 ml  Output 1645 ml  Net -135 ml   Filed Weights   12/07/17 1947  Weight: 74.8 kg (165 lb)   Examination: Physical Exam:  Constitutional: Well-nourished, well-developed obese Caucasian female is currently in no  acute distress  appears calm.  Eyes: Sclera are anicteric.  Lids and conjunctive are normal. ENMT: External ears and nose appear normal. Neck: Appears supple with no JVD Respiratory: Diminished to auscultation but no appreciable wheezing, rales, rhonchi.  Patient has normal respiratory effort is not tachypneic or using accessory muscle breathe and she is not wearing any supplemental oxygen via nasal cannula. Cardiovascular: Regular rate and rhythm.  No appreciable murmurs, rubs, gallops.  No extremity edema noted Abdomen: Soft, nontender, slightly tender to body habitus.  Bowel sounds present all 4 quadrants GU: Indwelling Foley catheter has been exchanged with a draining yellow-colored urine. Musculoskeletal: No contractures or cyanosis.  No joint deformities noted Skin: Upon further review sacral decubitus  was originally thought to be stage II however there is no appreciable skin breakdown.  No rashes appreciated on a limited skin rash. Neurologic: Cranial nerves II through XII grossly intact with no appreciable focal deficits. Psychiatric: Normal mood and affect.  Intact judgment intact.  Patient is awake, alert and oriented x3.  Data Reviewed: I have personally reviewed following labs and imaging studies  CBC: Recent Labs  Lab 12/08/17 0024 12/08/17 0329 12/09/17 0330  WBC 14.9* 23.8* 18.0*  NEUTROABS  --   --  13.8*  HGB 15.2* 15.3* 12.9  HCT 46.0 45.9 40.3  MCV 95.4 95.6 96.4  PLT 498* 546* 483*   Basic Metabolic Panel: Recent Labs  Lab 12/08/17 0024 12/08/17 0329 12/09/17 0330  NA  --  143 139  K  --  3.6 3.3*  CL  --  106 105  CO2  --  27 26  GLUCOSE  --  138* 99  BUN  --  12 13  CREATININE 0.56 0.60 0.78  CALCIUM  --  8.7* 8.1*  MG  --  1.7 1.8  PHOS  --  3.0 2.3*   GFR: Estimated Creatinine Clearance: 65.5 mL/min (by C-G formula based on SCr of 0.78 mg/dL). Liver Function Tests: Recent Labs  Lab 12/08/17 0329 12/09/17 0330  AST 17 20  ALT 12* 12*    ALKPHOS 98 79  BILITOT 1.1 0.9  PROT 7.1 6.0*  ALBUMIN 3.6 2.9*   No results for input(s): LIPASE, AMYLASE in the last 168 hours. No results for input(s): AMMONIA in the last 168 hours. Coagulation Profile: No results for input(s): INR, PROTIME in the last 168 hours. Cardiac Enzymes: No results for input(s): CKTOTAL, CKMB, CKMBINDEX, TROPONINI in the last 168 hours. BNP (last 3 results) No results for input(s): PROBNP in the last 8760 hours. HbA1C: No results for input(s): HGBA1C in the last 72 hours. CBG: Recent Labs  Lab 12/07/17 2300 12/08/17 1659 12/08/17 2112  GLUCAP 104* 114* 139*   Lipid Profile: No results for input(s): CHOL, HDL, LDLCALC, TRIG, CHOLHDL, LDLDIRECT in the last 72 hours. Thyroid Function Tests: Recent Labs    12/08/17 0023 12/08/17 0821  TSH 38.484*  --   FREET4  --  0.76*  T3FREE  --  1.6*   Anemia Panel: No results for input(s): VITAMINB12, FOLATE, FERRITIN, TIBC, IRON, RETICCTPCT in the last 72 hours. Sepsis Labs: Recent Labs  Lab 12/08/17 1317 12/09/17 0330  PROCALCITON 1.12 1.00    Recent Results (from the past 240 hour(s))  Culture, respiratory (NON-Expectorated)     Status: None (Preliminary result)   Collection Time: 12/08/17  1:03 AM  Result Value Ref Range Status   Specimen Description   Final    TRACHEAL ASPIRATE Performed at [facility], [address], [place] [address]    Special Requests   Final    Immunocompromised Performed at [facility], [address], [place], [place] [address]    Gram Stain   Final    ABUNDANT WBC PRESENT, PREDOMINANTLY PMN RARE SQUAMOUS EPITHELIAL CELLS PRESENT ABUNDANT GRAM POSITIVE COCCI IN PAIRS MODERATE GRAM NEGATIVE COCCOBACILLI FEW GRAM POSITIVE RODS Performed at [facility], [address], [place], [place] [address]    Culture PENDING  Incomplete   Report Status PENDING  Incomplete  MRSA PCR Screening     Status: None    Collection Time: 12/09/17  5:05 AM  Result Value Ref Range Status   MRSA by PCR NEGATIVE NEGATIVE Final    Comment:        The GeneXpert MRSA Assay (FDA approved for NASAL specimens only), is one component of a comprehensive MRSA colonization surveillance program. It is not intended to diagnose MRSA infection nor to guide or monitor treatment for MRSA infections. Performed at [facility], [address], [place], [place] [address]      Radiology Studies: Dg Chest 2 View  Result Date: 12/07/2017 CLINICAL DATA:  Choking on food tonight. EXAM: CHEST - 2 VIEW COMPARISON:  February 20, 2014 FINDINGS: The mediastinal contour is normal. Heart size is mildly enlarged. Both lungs are clear. The visualized skeletal structures are stable. IMPRESSION: No active cardiopulmonary disease. Electronically Signed   By: Sherian Rein M.D.   On: 12/07/2017 21:16   Dg Chest Port 1 View  Result Date: 12/08/2017 CLINICAL DATA:  Aspiration pneumonia. EXAM: PORTABLE CHEST 1 VIEW COMPARISON:  12/07/2017 FINDINGS: Endotracheal tube is been removed since previous study. There has been interval development of left lower lobe atelectasis or consolidation. Right lung remains clear. IMPRESSION: New left lower lobe atelectasis versus consolidation following extubation. Electronically Signed   By: Myles Rosenthal M.D.   On: 12/08/2017 10:19   Portable Chest X-ray  Result Date: 12/08/2017 CLINICAL DATA:  Endotracheal tube placement. EXAM: PORTABLE CHEST 1 VIEW COMPARISON:  Chest radiograph performed earlier today at 8:53 p.m. FINDINGS: The patient's endotracheal tube is seen ending 2-3 cm above the carina. The lungs are well-aerated. Minimal left basilar atelectasis is noted. There is no evidence of pleural effusion or pneumothorax. The lung apices are partially obscured by the patient's head. The cardiomediastinal silhouette is mildly enlarged. No acute osseous abnormalities are seen. IMPRESSION: 1.  Endotracheal tube seen ending 2-3 cm above the carina. 2. Minimal left basilar atelectasis noted. Lungs otherwise clear. 3. Mild cardiomegaly. Electronically Signed   By: Roanna Raider M.D.   On: 12/08/2017 00:33   Scheduled Meds: . cyclobenzaprine  5 mg Oral TID WC  . enoxaparin (LOVENOX) injection  40 mg Subcutaneous QHS  . gabapentin  800 mg Oral QID  . insulin aspart  0-9 Units Subcutaneous TID WC  . levothyroxine  125 mcg Oral QAC breakfast  . nystatin  1 Bottle Topical BID  . phosphorus  500 mg Oral BID  . polyethylene glycol  17 g Oral BID  . potassium chloride  40 mEq Oral Once  . senna-docusate  1 tablet Oral BID   Continuous Infusions: . famotidine (PEPCID) IV Stopped (12/08/17 2326)  . meropenem (MERREM) IV Stopped (12/09/17 0556)  . vancomycin      LOS: 2 days   Merlene Laughter, DO [facility] Pager [phone]  If 7PM-7AM, please contact night-coverage [email or web address] Password [facility] 12/09/2017, 7:53 AM

## 2017-12-09 NOTE — Anesthesia Postprocedure Evaluation (Signed)
Anesthesia Post Note  Patient: [name]  Procedure(s) Performed: ESOPHAGOGASTRODUODENOSCOPY (EGD) (N/A ) FOREIGN BODY REMOVAL     Patient location during evaluation: SICU Anesthesia Type: General Level of consciousness: sedated Pain management: pain level controlled Vital Signs Assessment: post-procedure vital signs reviewed and stable Respiratory status: patient remains intubated per anesthesia plan Cardiovascular status: stable Postop Assessment: no apparent nausea or vomiting Anesthetic complications: no    Last Vitals:  Vitals:   12/09/17 0600 12/09/17 0800  BP: (!) 100/49   Pulse: 87   Resp: 16   Temp:  36.6 C  SpO2: 91%     Last Pain:  Vitals:   12/09/17 0800  TempSrc: Oral  PainSc:                  Lonie Rummell

## 2017-12-10 ENCOUNTER — Inpatient Hospital Stay (HOSPITAL_COMMUNITY): Payer: Medicare Other

## 2017-12-10 LAB — CBC WITH DIFFERENTIAL/PLATELET
BASOS PCT: 0 %
Basophils Absolute: 0 10*3/uL (ref 0.0–0.1)
Eosinophils Absolute: 0.4 10*3/uL (ref 0.0–0.7)
Eosinophils Relative: 4 %
HEMATOCRIT: 41.4 % (ref 36.0–46.0)
HEMOGLOBIN: 13.2 g/dL (ref 12.0–15.0)
Lymphocytes Relative: 12 %
Lymphs Abs: 1.4 10*3/uL (ref 0.7–4.0)
MCH: 30.8 pg (ref 26.0–34.0)
MCHC: 31.9 g/dL (ref 30.0–36.0)
MCV: 96.7 fL (ref 78.0–100.0)
MONOS PCT: 8 %
Monocytes Absolute: 0.9 10*3/uL (ref 0.1–1.0)
NEUTROS ABS: 8.8 10*3/uL — AB (ref 1.7–7.7)
NEUTROS PCT: 76 %
Platelets: 442 10*3/uL — ABNORMAL HIGH (ref 150–400)
RBC: 4.28 MIL/uL (ref 3.87–5.11)
RDW: 13.4 % (ref 11.5–15.5)
WBC: 11.6 10*3/uL — AB (ref 4.0–10.5)

## 2017-12-10 LAB — COMPREHENSIVE METABOLIC PANEL
ALBUMIN: 2.9 g/dL — AB (ref 3.5–5.0)
ALK PHOS: 77 U/L (ref 38–126)
ALT: 10 U/L — AB (ref 14–54)
ANION GAP: 8 (ref 5–15)
AST: 15 U/L (ref 15–41)
BILIRUBIN TOTAL: 0.5 mg/dL (ref 0.3–1.2)
BUN: 13 mg/dL (ref 6–20)
CALCIUM: 8.2 mg/dL — AB (ref 8.9–10.3)
CO2: 27 mmol/L (ref 22–32)
Chloride: 105 mmol/L (ref 101–111)
Creatinine, Ser: 0.53 mg/dL (ref 0.44–1.00)
GFR calc Af Amer: >60 mL/min (ref >60)
GFR calc non Af Amer: >60 mL/min (ref >60)
GLUCOSE: 97 mg/dL (ref 65–99)
Potassium: 3.9 mmol/L (ref 3.5–5.1)
Sodium: 140 mmol/L (ref 135–145)
TOTAL PROTEIN: 6 g/dL — AB (ref 6.5–8.1)

## 2017-12-10 LAB — PROCALCITONIN: Procalcitonin: 0.43 ng/mL

## 2017-12-10 LAB — GLUCOSE, CAPILLARY
GLUCOSE-CAPILLARY: 90 mg/dL (ref 65–99)
GLUCOSE-CAPILLARY: 122 mg/dL — AB (ref 65–99)
Glucose-Capillary: 91 mg/dL (ref 65–99)
Glucose-Capillary: 144 mg/dL — ABNORMAL HIGH (ref 65–99)

## 2017-12-10 LAB — MAGNESIUM: Magnesium: 1.8 mg/dL (ref 1.7–2.4)

## 2017-12-10 LAB — PHOSPHORUS: Phosphorus: 3.4 mg/dL (ref 2.5–4.6)

## 2017-12-10 MED ORDER — VANCOMYCIN HCL 10 G IV SOLR
1250.0000 mg | INTRAVENOUS | Status: DC
  Administered 2017-12-11: 1250 mg via INTRAVENOUS
  Filled 2017-12-10: qty 1250

## 2017-12-10 NOTE — Progress Notes (Signed)
PROGRESS NOTE    [name]  WUJ:[id number] DOB: [birth year] DOA: 12/07/2017 PCP: [name], NP   Brief Narrative:  [name] is a 67 y.o. female with medical history significant of multiple sclerosis, GERD, sacral decubitus ulcer with bedridden status, recurring UTI and diabetes Mellitus, Hypothyroidism and other comorbids who was apparently eating spaghetti with green beans tonight when she suddenly choked.  Heimlick's maneuver was performed but patient remained short of breath and hypoxic.  MS was called and patient was brought to the emergency room.  On arrival in the ER patient was found to have oxygen sats in the 80s.  She was struggling to breathe.  Patient was therefore admitted with possible aspiration pneumonitis and placed on NRB. She was intubated for EGD and for removal of residual food particles and was Extubated early this AM. She is improving but likely has a UTI and is to have her chronic suprapubic catheter changed.  Patient was weaned off oxygen on room air and was doing well.  She is stable to be transferred to the medical floor. PT/OT Screened and she has Caregivers. Upon Social Work Visit there is no concern for patient's living conditions.   Assessment & Plan:   Principal Problem:   Acute respiratory failure with hypoxia (HCC) Active Problems:   Multiple sclerosis (HCC)   GERD (gastroesophageal reflux disease)   Hypothyroidism   DM type 2 (diabetes mellitus, type 2) (HCC)   Sacral decubitus ulcer, stage II   Bedridden   Aspiration pneumonitis (HCC)  Acute Respiratory Failure with Hypoxia From Aspiration/ Aspiration Pneumonitis/PNA requiring Short Term Mechanical Ventilation for Procedure s/p Extubation, improved -S/p Extubation early 12/08/17 -Does Not wear O2 at Home -Pulmonary Hygiene and Pulmonary Toilet  -C/w supplemental oxygen via nasal cannula and wean as tolerated -Continuous pulse oximetry and maintain O2 saturations greater than 92% -Continue  with Flutter valve and Incentive Spirometer -Oral care with chlorhexidine 15 mL's mouth rinse twice daily as well as Medline mouth rinse 15 mL's per oral care protocol -Repeat CXR this AM showed The cardiac silhouette is borderline to mildly enlarged. Lung volumes are low with new pulmonary vascular congestion and peribronchial cuffing/diffuse interstitial accentuation. No focal airspace consolidation, sizeable pleural effusion, or pneumothorax is identified -Since patient has been extubated we will DC her Fentanyl and her Midazolam  -Continued with IV vancomycin and IV meropenem for now given her multiple penicillin and quinolone allergies; D/C'd IV Vancomycin yesterday but will resume and continue IV Meropenem for now and de-escalate accordingly  -Patient was weaned off of oxygen and is on Room Air   Aspiration Pneumonitis/Aspiration PNA -Patient undewent EGD with possible removal of full debris. EGD report showed: Benign-appearing esophageal stenosis. Food at the cricopharyngeus and removal was successful. A medium amount of food (residue) in the stomach. Retained food in the duodenum. -She was expected to be intubated for procedure in the OR.   -Postoperatively Antibiotics were initiated with IV Vancomycin and IV Meropenem  -Added DuoNeb 3 mL q6hprn for Wheezing and SOB and will continue  -Pulmonary Hygiene and Pulmonary Toilet  -C/w supplemental oxygen via nasal cannula and wean as tolerated; now saturating on room air -Continuous pulse oximetry and maintain O2 saturations greater than 92% -Continue with Flutter valve and Incentive Spirometer -Oral care with chlorhexidine 15 mL's mouth rinse twice daily as well as Medline mouth rinse 15 mL's per oral care protocol -Procalcitonin being Checked and per Pulmonary if Negative would limit use of Abx to 7 days at  most; Procalcitonin was 1.12 and trending down to 0.43 -SLP evaluated and patient on a Dysphagia 3 Diet (Mechanical Soft) with Thin  Liquids; some confusion this morning because of GI documentation and her breakfast tray was removed however after my discussion with Dr. Levora Angel in GI diet was resumed per SLP -SLP re-evaluated and patient had coughing with food so will undergo MBS in AM  -Repeat CXR this AM as above and showed new pulmonary vascular congestion and peribronchial cuffing/diffuse interstitial accentuation. -Trach Respiratory Cx Sent for Gram Stain and Culture and was obtained prior to Abx Exposure -Gram Stain Showed:  ABUNDANT WBC PRESENT, PREDOMINANTLY PMN  RARE SQUAMOUS EPITHELIAL CELLS PRESENT  ABUNDANT GRAM POSITIVE COCCI IN PAIRS  MODERATE GRAM NEGATIVE COCCOBACILLI  FEW GRAM POSITIVE RODS  -Cx Reincubated for better Growth and showed Rare Staphylococcus Aures with Susceptibilities to follow; Will restart IV Vancomycin and de-escalate when sensitivities result  -WBC went from 14.9 -> 23.9 -> 18.0 -> 11.6 and is now Afebrile -Continue Abx as above and Repeat CBC in AM   Multiple Sclerosis -Appears to be at baseline.   -Patient likely may have aspirated due to progressive disease.   -Not on any maintenance or suppressive therapy currently  -Continue Supportive Therapies and Home Medications including: Cyclobenzaprine 5 mg p.o. 3 times daily with meals, gabapentin 800 mg p.o. 4 times daily, and tizanidine 4 mg p.o. twice daily -Pain control with oxycodone acetaminophen 5-3 25 1  tab every 4 hours as needed for severe pain and tramadol-acetaminophen 37.5-35 mg/tab. 1 tablet p.o. every 6 as needed as needed -PT/OT signed off as she has Care Givers   GERD/GI Prophylaxis  -IV Famotidine 20 mg q12h changed to p.o.  Diabetes Mellitus Type 2  -Hold Home Metformin -Start Patient on Sensitive Novolog SSI AC -CBGs range from 90-144  Hypothyroidism -? Of Medication Compliance and it was found that patient was not taking due to preference and that caregivers had not been giving the patient Synthroid    -Patient's TSH was 38.484 and T4 was 0.76; Free T3 pending -Restart Patient's Home Levothyroxine at 125 mcg po Daily -Will need Repeat TSH and Thyroid studies in 4-6 weeks   Suspected Catheter Associated UTI  -Cannot remember when catheter was changed -Catheter has green discharge and is very foul-smelling -Have asked nurse to change Foley catheter and send urinalysis and urine culture -Urinalysis showed cloudy urine with large Hgb, trace ketones, large leukocytes, rare bacteria, present amorphous crystals, and 21-50 WBCs -Urine cultures showed Staphyloccoccus Aureus 20,000 CVF -Continue Empiric antibiotics as above and stopped IV Vancomycin but will restart and stop IV Meropenem  -WBC Trending down and is now 18.0 -Continue to follow urine culture Sensitivities   ? Sacral Decubitus Ulcer, improved  -On admission was thought to be stage 2 but upon further has no Breakdown -WOC Nurse Consulted for Wound care in the hospital -Upon evaluation no irritation or breakdown to the Sacral/Buttock Area -Appreciate WOC Nurse evaluation   Thrombocytosis, improving  -Likely Reactive to Above -Patient's Platelet Count went from 498 -> 546 -> 483 -> 442 -Continue to Trend and Repeat CBC in AM   Hypokalemia -Patient's potassium was 3.3 and improved to 3.9 -Continue to monitor and replete as necessary -Repeat CMP in the a.m.  Hypophosphatemia -Patient's phosphorus level was 2.3 and improved to 3.4 -Replete with K-Phos Neutral 500 mg p.o. twice daily x2 doses yesterday  -Continue to monitor and replete as necessary -Repeat phosphorus level in the a.m.  DVT prophylaxis:  Enoxaparin 40 mg sq q24h Code Status: FULL CODE Family Communication: No family present at bedside  Disposition Plan:Transfer to Medical Floor this AMas she has improved. Social Worker consulted for ? Unsafe living conditions but ruled out unsafe conditions. Patient to have MBS in AM   Consultants:  [facility]  Gastroenterology PCCM/Pulmonary   Procedures:  EGD Findings:      One benign-appearing, intrinsic moderate stenosis was found at level of       upper esophageal stricture.      Food was found at the cricopharyngeus. Removal of food was accomplished.       Estimated blood loss: none.      The exam of the esophagus was otherwise normal.      A medium amount of food (residue) was found in the cardia, in the       gastric fundus and in the gastric body.      With limitations of mucosal evaluation given retained gastric contents,       the exam of the stomach was otherwise normal.      Food (residue) was found in the duodenal bulb, precluding good views of       this area. Impression:               - Benign-appearing esophageal stenosis.                           - Food at the cricopharyngeus. Removal was                            successful.                           - A medium amount of food (residue) in the stomach.                           - Retained food in the duodenum.   Antimicrobials:  Anti-infectives (From admission, onward)   Start     Dose/Rate Route Frequency Ordered Stop   12/09/17 1200  vancomycin (VANCOCIN) 1,250 mg in sodium chloride 0.9 % 250 mL IVPB  Status:  Discontinued     1,250 mg 166.7 mL/hr over 90 Minutes Intravenous Every 36 hours 12/08/17 0119 12/09/17 1223   12/08/17 0130  vancomycin (VANCOCIN) 1,500 mg in sodium chloride 0.9 % 500 mL IVPB     1,500 mg 250 mL/hr over 120 Minutes Intravenous  Once 12/08/17 0119 12/08/17 0445   12/08/17 0115  meropenem (MERREM) 1 g in sodium chloride 0.9 % 100 mL IVPB     1 g 200 mL/hr over 30 Minutes Intravenous Every 8 hours 12/08/17 0109       Subjective: Seen and examined at bedside was doing better. Speech was notified of coughing so patient to undergo MBS tomorrow. No CP, SOB, Nausea, Vomiting and doing well.   Objective: Vitals:   12/09/17 1540 12/09/17 2154 12/10/17 0523 12/10/17 1450  BP: 136/71 127/68  134/68 (!) 157/86  Pulse: 92 (!) 105 98 84  Resp:  18    Temp: 98.4 F (36.9 C) 99.6 F (37.6 C) 98.8 F (37.1 C) 98.1 F (36.7 C)  TempSrc:  Oral Oral Oral  SpO2: 100% 95% 94% 100%  Weight:      Height:        Intake/Output Summary (  Last 24 hours) at 12/10/2017 1721 Last data filed at 12/10/2017 1400 Gross per 24 hour  Intake 440 ml  Output 2425 ml  Net -1985 ml   Filed Weights   12/07/17 1947  Weight: 74.8 kg (165 lb)   Examination: Physical Exam:  Constitutional: Well-nourished, well-developed obese Caucasian female is currently in no acute distress and appears calm and had just woken up from sleep. Eyes: Sclera anicteric.  Lids and conjunctive are normal. ENMT: External ears and nose appear normal.  Grossly normal hearing Neck: Appears supple with no JVD Respiratory: Diminished to auscultation bilaterally no appreciable wheezing, rales, rhonchi.  Patient has a normal respiratory effort and has unlabored breathing.  She is not wearing any supplemental oxygen via nasal cannula Cardiovascular: Regular rate and rhythm.  No appreciable murmurs, rubs, gallops. Abdomen: Soft, nontender, slightly distended secondary body habitus.  Bowel sounds present all 4 quadrants GU: Has a suprapubic catheter in place. Musculoskeletal: Has some lower extremity contracture in the feet from multiple sclerosis.  No cyanosis. Skin: Warm and dry.  No appreciable rashes or lesions on limited skin evaluation Neurologic: No nerves II through XII grossly intact with no appreciable focal deficits.  Does not move lower extremities from her multiple sclerosis and has some contractures Psychiatric: Normal pleasant mood and affect.  Intact judgment and insight.  Patient is awake, alert, and oriented x3  Data Reviewed: I have personally reviewed following labs and imaging studies  CBC: Recent Labs  Lab 12/08/17 0024 12/08/17 0329 12/09/17 0330 12/10/17 0535  WBC 14.9* 23.8* 18.0* 11.6*  NEUTROABS   --   --  13.8* 8.8*  HGB 15.2* 15.3* 12.9 13.2  HCT 46.0 45.9 40.3 41.4  MCV 95.4 95.6 96.4 96.7  PLT 498* 546* 483* 442*   Basic Metabolic Panel: Recent Labs  Lab 12/08/17 0024 12/08/17 0329 12/09/17 0330 12/10/17 0535  NA  --  143 139 140  K  --  3.6 3.3* 3.9  CL  --  106 105 105  CO2  --  27 26 27   GLUCOSE  --  138* 99 97  BUN  --  12 13 13   CREATININE 0.56 0.60 0.78 0.53  CALCIUM  --  8.7* 8.1* 8.2*  MG  --  1.7 1.8 1.8  PHOS  --  3.0 2.3* 3.4   GFR: Estimated Creatinine Clearance: 65.5 mL/min (by C-G formula based on SCr of 0.53 mg/dL). Liver Function Tests: Recent Labs  Lab 12/08/17 0329 12/09/17 0330 12/10/17 0535  AST 17 20 15   ALT 12* 12* 10*  ALKPHOS 98 79 77  BILITOT 1.1 0.9 0.5  PROT 7.1 6.0* 6.0*  ALBUMIN 3.6 2.9* 2.9*   No results for input(s): LIPASE, AMYLASE in the last 168 hours. No results for input(s): AMMONIA in the last 168 hours. Coagulation Profile: No results for input(s): INR, PROTIME in the last 168 hours. Cardiac Enzymes: No results for input(s): CKTOTAL, CKMB, CKMBINDEX, TROPONINI in the last 168 hours. BNP (last 3 results) No results for input(s): PROBNP in the last 8760 hours. HbA1C: No results for input(s): HGBA1C in the last 72 hours. CBG: Recent Labs  Lab 12/09/17 1716 12/09/17 2155 12/10/17 0741 12/10/17 1116 12/10/17 1625  GLUCAP 96 138* 91 144* 90   Lipid Profile: No results for input(s): CHOL, HDL, LDLCALC, TRIG, CHOLHDL, LDLDIRECT in the last 72 hours. Thyroid Function Tests: Recent Labs    12/08/17 0023 12/08/17 0821  TSH 38.484*  --   FREET4  --  0.76*  T3FREE  --  1.6*   Anemia Panel: No results for input(s): VITAMINB12, FOLATE, FERRITIN, TIBC, IRON, RETICCTPCT in the last 72 hours. Sepsis Labs: Recent Labs  Lab 12/08/17 1317 12/09/17 0330 12/10/17 0535  PROCALCITON 1.12 1.00 0.43    Recent Results (from the past 240 hour(s))  Culture, respiratory (NON-Expectorated)     Status: None (Preliminary  result)   Collection Time: 12/08/17  1:03 AM  Result Value Ref Range Status   Specimen Description   Final    TRACHEAL ASPIRATE Performed at [facility], [address] [place], [place] [address]    Special Requests   Final    Immunocompromised Performed at [facility], [address] [place], [place] [address]    Gram Stain   Final    ABUNDANT WBC PRESENT, PREDOMINANTLY PMN RARE SQUAMOUS EPITHELIAL CELLS PRESENT ABUNDANT GRAM POSITIVE COCCI IN PAIRS MODERATE GRAM NEGATIVE COCCOBACILLI FEW GRAM POSITIVE RODS    Culture   Final    RARE STAPHYLOCOCCUS AUREUS SUSCEPTIBILITIES TO FOLLOW Performed at [facility], [address], [place], [place] [address]    Report Status PENDING  Incomplete  Culture, Urine     Status: Abnormal (Preliminary result)   Collection Time: 12/08/17  4:20 PM  Result Value Ref Range Status   Specimen Description   Final    URINE, SUPRAPUBIC Performed at [facility], [address] [place], [place] [address]    Special Requests   Final    NONE Performed at [facility], [address], [place], [place] [address]    Culture 20,000 COLONIES/mL STAPHYLOCOCCUS AUREUS (A)  Final   Report Status PENDING  Incomplete  MRSA PCR Screening     Status: None   Collection Time: 12/09/17  5:05 AM  Result Value Ref Range Status   MRSA by PCR NEGATIVE NEGATIVE Final    Comment:        The GeneXpert MRSA Assay (FDA approved for NASAL specimens only), is one component of a comprehensive MRSA colonization surveillance program. It is not intended to diagnose MRSA infection nor to guide or monitor treatment for MRSA infections. Performed at [facility], [address], [place], [place] [address]      Radiology Studies: Dg Chest Port 1 View  Result Date: 12/10/2017 CLINICAL DATA:  Shortness of breath. EXAM: PORTABLE CHEST 1 VIEW COMPARISON:  12/09/2017  FINDINGS: The cardiac silhouette is borderline to mildly enlarged. Lung volumes are low with new pulmonary vascular congestion and peribronchial cuffing/diffuse interstitial accentuation. No focal airspace consolidation, sizeable pleural effusion, or pneumothorax is identified. IMPRESSION: Low lung volumes with new pulmonary vascular congestion/mild edema. Electronically Signed   By: Sebastian Ache M.D.   On: 12/10/2017 08:56   Dg Chest Port 1 View  Result Date: 12/09/2017 CLINICAL DATA:  History of aspiration EXAM: PORTABLE CHEST 1 VIEW COMPARISON:  Portable chest x-ray of 12/08/2017 FINDINGS: The left lung base is now much better aerated with only minimal atelectasis remaining. A tiny right pleural effusion cannot be excluded. No new parenchymal infiltrate is seen and mild cardiomegaly is stable. IMPRESSION: Markedly improved aeration of the left lung base. Question tiny right pleural effusion. Electronically Signed   By: Dwyane Dee M.D.   On: 12/09/2017 08:43   Scheduled Meds: . cyclobenzaprine  5 mg Oral TID WC  . enoxaparin (LOVENOX) injection  40 mg Subcutaneous QHS  . famotidine  20 mg Oral BID  . gabapentin  800 mg Oral QID  . insulin aspart  0-9 Units Subcutaneous TID WC  . levothyroxine  125 mcg Oral QAC breakfast  . nystatin  1 Bottle Topical BID  . polyethylene glycol  17 g Oral BID  . senna-docusate  1 tablet Oral BID   Continuous Infusions: . meropenem (MERREM) IV Stopped (12/10/17 1450)    LOS: 3 days   Merlene Laughter, DO [facility] Pager [phone]  If 7PM-7AM, please contact night-coverage [email or web address] Password [facility] 12/10/2017, 5:21 PM

## 2017-12-10 NOTE — Progress Notes (Signed)
Pharmacy Antibiotic Note  [name] is a 67 y.o. female with multiple sclerosis, DM, sacral decubitus ulcer with bedridden status, recurrent UTIs with chronic suprapubic catheter admitted on 12/07/2017 with aspiration  Pneumonitis requiring intubation.  Pharmacy has been consulted to resume Vancomycin dosing for Staph aureus in urine culture.  Day #3 antibiotics Afebrile WBC improved 11.6 SCr 0.53, CrCl 65 ml/min PCT 1.12 > 1.0 > 0.43  Plan: Vancomycin 1500mg  given 6/9, continue with 1250mg  iv q36hr Check vancomycin levels at steady state, goal AUC 400-500 Continue meropenem 1g IV q8h per MD Follow up renal function & cultures   Height: 5\' 2"  (157.5 cm) Weight: 165 lb (74.8 kg) IBW/kg (Calculated) : 50.1  Temp (24hrs), Avg:98.8 F (37.1 C), Min:98.1 F (36.7 C), Max:99.6 F (37.6 C)  Recent Labs  Lab 12/08/17 0024 12/08/17 0329 12/09/17 0330 12/10/17 0535  WBC 14.9* 23.8* 18.0* 11.6*  CREATININE 0.56 0.60 0.78 0.53    Estimated Creatinine Clearance: 65.5 mL/min (by C-G formula based on SCr of 0.53 mg/dL).    Allergies  Allergen Reactions  . Avelox [Moxifloxacin Hcl In Nacl] Hives and Other (See Comments)    Panic attack  . Ciprofloxacin Hcl Hives and Other (See Comments)    Panic attack  . Penicillins Hives    Has patient had a PCN reaction causing immediate rash, facial/tongue/throat swelling, SOB or lightheadedness with hypotension: Unknown Has patient had a PCN reaction causing severe rash involving mucus membranes or skin necrosis: Unknown Has patient had a PCN reaction that required hospitalization: Unknown Has patient had a PCN reaction occurring within the last 10 years: Unknown If all of the above answers are "NO", then may proceed with Cephalosporin use.   . Shrimp [Shellfish Allergy] Other (See Comments)    flushing    Antimicrobials this admission:  Vancomycin 12/08/2017 >> 6/10, resumed 6/11 Meropenem 12/08/2017 >>   Dose adjustments this  admission:    Microbiology results:  6/9 UCx: 20k Staph aureus 6/9 tracheal aspirate: Gr+ cocci in pairs, Gr- coccobacilli; cx rare Staph aureus 6/9 MRSA PCR: negative  Thank you for allowing pharmacy to be a part of this patient's care.  Loralee Pacas, PharmD, BCPS Pager: [phone] 12/10/2017 5:49 PM

## 2017-12-10 NOTE — Progress Notes (Signed)
PT Cancellation Note  Patient Details Name: [name] MRN: [id number] DOB: Nov 11, 1950   Cancelled Treatment:    Reason Eval/Treat Not Completed: PT screened, no needs identified, will sign off, patient is total care PTA. OOB via lift at home.    Rada Hay 12/10/2017, 3:30 PM  Blanchard Kelch PT [phone]

## 2017-12-10 NOTE — Progress Notes (Signed)
  Speech Language Pathology Treatment:    Patient Details Name: [name] MRN: [id number] DOB: 1951/04/13 Today's Date: 12/10/2017 Time: 4098-1191 SLP Time Calculation (min) (ACUTE ONLY): 10 min  Assessment / Plan / Recommendation Clinical Impression  Pt reports good tolerance of po diet - denies issues with further coughing/choking with intake.  Pt's cousin Elnita Maxwell present and admits that pt was observed to "choke" on foods/corn bread recently *early June* and she does not eat with pt often.  SlP questions if pt with decreased awareness to her dysphagia due to insidious progression.  Advised to proceed with MBS next date to allow instrumental eval given pt h/o MS and significant recent "choking" episode.  Pt, family (cousin Elnita Maxwell) and MD agreeable to plan. SlP questions it pt may have CP spasms related to her MS.  MBS next am planned, recommend continue dys3/thin diet in interim- pt is essentially edentulous.    HPI HPI: 67 y.o. female with medical history significant of multiple sclerosis, GERD, sacral decubitus ulcer with bedridden status, recurring UTI and diabetes who was apparently eating spaghetti with green beans tonight when she suddenly choked.  Heimlick's maneuver was performed but patient remained short of breath and hypoxic.  EMS was called and patient was brought to the emergency room.  On arrival in the ER patient was found to have oxygen sats in the 80s.  She was struggling to breathe.  Patient was therefore admitted with possible aspiration pneumonitis.  Pt is s/p EGD with findings of stenosis at proximal esophagus with retained food removed.  GI had rec NPO until further notice per EGD documentation- but hospitalist followed up and GI approved diet order.  Follow up from SLP to assure tolerance and recommend compensation strategies.       SLP Plan  Continue with current plan of care       Recommendations  Diet recommendations: Dysphagia 3 (mechanical soft);Thin  liquid Liquids provided via: Straw;Cup Medication Administration: Other (Comment)(Whole meds with liquids; one at a time) Supervision: Patient able to self feed Compensations: Slow rate;Small sips/bites(drink liquids t/o meal) Postural Changes and/or Swallow Maneuvers: Upright 30-60 min after meal;Seated upright 90 degrees                Oral Care Recommendations: Oral care BID Follow up Recommendations: None SLP Visit Diagnosis: Dysphagia, pharyngoesophageal phase (R13.14) Plan: Continue with current plan of care       GO              Donavan Burnet, MS [facility] SLP [phone]   Chales Abrahams 12/10/2017, 3:22 PM

## 2017-12-10 NOTE — Care Management Important Message (Signed)
Important Message  Patient Details  Name: [name] MRN: [id number] Date of Birth: May 13, 1951   Medicare Important Message Given:  Yes    Caren Macadam 12/10/2017, 11:04 AMImportant Message  Patient Details  Name: [name] MRN: [id number] Date of Birth: [birth year]   Medicare Important Message Given:  Yes    Caren Macadam 12/10/2017, 11:04 AM
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
## 2017-12-11 ENCOUNTER — Inpatient Hospital Stay (HOSPITAL_COMMUNITY): Payer: Medicare Other

## 2017-12-11 DIAGNOSIS — Z7401 Bed confinement status: Secondary | ICD-10-CM

## 2017-12-11 DIAGNOSIS — E119 Type 2 diabetes mellitus without complications: Secondary | ICD-10-CM

## 2017-12-11 DIAGNOSIS — L89152 Pressure ulcer of sacral region, stage 2: Secondary | ICD-10-CM

## 2017-12-11 DIAGNOSIS — T17308D Unspecified foreign body in larynx causing other injury, subsequent encounter: Secondary | ICD-10-CM

## 2017-12-11 DIAGNOSIS — E039 Hypothyroidism, unspecified: Secondary | ICD-10-CM

## 2017-12-11 DIAGNOSIS — G35 Multiple sclerosis: Secondary | ICD-10-CM

## 2017-12-11 DIAGNOSIS — Z9359 Other cystostomy status: Secondary | ICD-10-CM

## 2017-12-11 LAB — COMPREHENSIVE METABOLIC PANEL
ALK PHOS: 80 U/L (ref 38–126)
ALT: 9 U/L — ABNORMAL LOW (ref 14–54)
ANION GAP: 9 (ref 5–15)
AST: 13 U/L — ABNORMAL LOW (ref 15–41)
Albumin: 2.8 g/dL — ABNORMAL LOW (ref 3.5–5.0)
BILIRUBIN TOTAL: 0.7 mg/dL (ref 0.3–1.2)
BUN: 9 mg/dL (ref 6–20)
CALCIUM: 8.4 mg/dL — AB (ref 8.9–10.3)
CO2: 25 mmol/L (ref 22–32)
Chloride: 105 mmol/L (ref 101–111)
Creatinine, Ser: 0.47 mg/dL (ref 0.44–1.00)
Glucose, Bld: 91 mg/dL (ref 65–99)
POTASSIUM: 4.3 mmol/L (ref 3.5–5.1)
Sodium: 139 mmol/L (ref 135–145)
TOTAL PROTEIN: 6.1 g/dL — AB (ref 6.5–8.1)

## 2017-12-11 LAB — CULTURE, RESPIRATORY W GRAM STAIN

## 2017-12-11 LAB — CBC WITH DIFFERENTIAL/PLATELET
BASOS ABS: 0.1 10*3/uL (ref 0.0–0.1)
BASOS PCT: 1 %
EOS ABS: 0.5 10*3/uL (ref 0.0–0.7)
EOS PCT: 5 %
HCT: 41.9 % (ref 36.0–46.0)
Hemoglobin: 13.7 g/dL (ref 12.0–15.0)
LYMPHS PCT: 18 %
Lymphs Abs: 1.8 10*3/uL (ref 0.7–4.0)
MCH: 31.3 pg (ref 26.0–34.0)
MCHC: 32.7 g/dL (ref 30.0–36.0)
MCV: 95.7 fL (ref 78.0–100.0)
Monocytes Absolute: 0.7 10*3/uL (ref 0.1–1.0)
Monocytes Relative: 7 %
Neutro Abs: 7 10*3/uL (ref 1.7–7.7)
Neutrophils Relative %: 69 %
Platelets: 415 10*3/uL — ABNORMAL HIGH (ref 150–400)
RBC: 4.38 MIL/uL (ref 3.87–5.11)
RDW: 13 % (ref 11.5–15.5)
WBC: 10.1 10*3/uL (ref 4.0–10.5)

## 2017-12-11 LAB — URINE CULTURE: Culture: 20000 — AB

## 2017-12-11 LAB — PHOSPHORUS: PHOSPHORUS: 2.6 mg/dL (ref 2.5–4.6)

## 2017-12-11 LAB — GLUCOSE, CAPILLARY
GLUCOSE-CAPILLARY: 92 mg/dL (ref 65–99)
Glucose-Capillary: 98 mg/dL (ref 65–99)

## 2017-12-11 LAB — MAGNESIUM: MAGNESIUM: 1.9 mg/dL (ref 1.7–2.4)

## 2017-12-11 MED ORDER — CEPHALEXIN 500 MG PO CAPS
500.0000 mg | ORAL_CAPSULE | Freq: Four times a day (QID) | ORAL | Status: DC
  Administered 2017-12-11: 500 mg via ORAL
  Filled 2017-12-11: qty 1

## 2017-12-11 MED ORDER — POLYETHYLENE GLYCOL 3350 17 G PO PACK: 17.0000 g | PACK | Freq: Two times a day (BID) | ORAL | 0 refills | Status: AC

## 2017-12-11 MED ORDER — CEPHALEXIN 500 MG PO CAPS: 500.0000 mg | ORAL_CAPSULE | Freq: Four times a day (QID) | ORAL | 0 refills | Status: AC

## 2017-12-11 NOTE — Discharge Summary (Signed)
Physician Discharge Summary  [name]:[id number] DOB: 1950/09/16 DOA: 12/07/2017  PCP: [name], NP  Admit date: 12/07/2017 Discharge date: 12/11/2017  Admitted From: home Disposition:  home   Recommendations for Outpatient Follow-up:  1. Home hospice resumed 2. Suprapubic cath to be changed on 7/10    Discharge Condition:  stable   CODE STATUS:  Full code Consultations:  PCCM  GI     Discharge Diagnoses:  Principal Problem:   Acute respiratory failure with hypoxia (HCC) Active Problems:   Hypothyroidism   Aspiration pneumonitis (HCC)   Multiple sclerosis (HCC)   GERD (gastroesophageal reflux disease)   DM type 2 (diabetes mellitus, type 2) (HCC)   Sacral decubitus ulcer, stage II   Bedridden   Suprapubic catheter (HCC)    Subjective: No cough, trouble swallowing or other complaints.   Brief Summary: [name] a 67 y.o.femalewith medical history significant ofmultiple sclerosis, bed bound, GERD, sacral decubitus ulcer, suprapubic recurring UTI and Diabetes Mellitus, Hypothyroidism and other comorbids who was apparently eating spaghetti with green beans   when she suddenly choked. Heimlick's maneuver was performed. GI was consulted as she felt a foreign body sensation in her chest and an EGD was planned. According to critical care note from 6/9, the patient was not in any respiratory distress in the ED however, when she went to x-ray she desaturated, experienced orthopnea and was placed on 100% nonrebreather.  It was subsequently decided to electively intubate her in the endoscopy suite.   Hospital Course:  Choking -aspiration pneumonitis- Stap in tracheal aspirate - EGD 6/8  revealed : - Benign-appearing esophageal stenosis. - Food at the cricopharyngeus. Removal was  successful. - A medium amount of food (residue) in the stomach. -  Retained food in the duodenum. -The patient was managed by the critical care service subsequently and was extubated the following AM (6/9). -Was started on IV vancomycin and meropenem by pulmonary Tracheal aspirate was sent for culture and this returned showing staph aureus which is pansensitive -Due to her sensitivities allergies, it is been decided to place her on Keflex for a total of a 7-day course -She has been placed on a mechanical soft diet with thin liquids with which she is doing quite well -She is not requiring any oxygen and is stable to be discharged home  Multiple sclerosis -At baseline which is bedbound with suprapubic catheter, able to use upper extremities well  - cont Flexeril, Neurontin and Tizanidine  Chronic suprapubic catheter Patient did not remember when her suprapubic catheter was last changed-it was changed here in the hospital - Urine culture was sent on 5/9- showing 20K Stap aureus, pan sensitive  Stage II decubitus ulcer -Continue care at home  Hypothyroidism -TSH noted to be 38.484, free T4 0.76, free T3 1.6 - Synthroid has been resumed- it was noted that she may have not been taking it and she has been advised to take it  Type 2 diabetes mellitus Continue metformin  Discharge Exam: Vitals:   12/11/17 0551 12/11/17 1347  BP: 130/70 (!) 158/89  Pulse: 94 91  Resp:  20  Temp: 98.7 F (37.1 C) 98.6 F (37 C)  SpO2: 90% 98%   Vitals:   12/10/17 1450 12/10/17 2135 12/11/17 0551 12/11/17 1347  BP: (!) 157/86 (!) 152/64 130/70 (!) 158/89  Pulse: 84 92 94 91  Resp:  16  20  Temp: 98.1 F (36.7 C) 98.1 F (36.7 C) 98.7 F (37.1 C) 98.6 F (37 C)  TempSrc: Oral Oral Oral Oral  SpO2: 100% 97% 90% 98%  Weight:      Height:        General: Pt is alert, awake, not in acute distress Cardiovascular: RRR, S1/S2 +, no rubs, no gallops Respiratory: CTA bilaterally, no wheezing, no rhonchi Abdominal: obese, Soft, NT, ND, bowel sounds +, Has suprapubic  catheter  Extremities: no edema, no cyanosis Neuro: no movement in legs,     Discharge Instructions  Discharge Instructions    Diet - low sodium heart healthy   Complete by:  As directed    Discharge instructions   Complete by:  As directed    Your thyroid hormone levels are abnormal. Please take your thyroid medication as prescribed.   Increase activity slowly   Complete by:  As directed      Allergies as of 12/11/2017      Reactions   Avelox [moxifloxacin Hcl In Nacl] Hives, Other (See Comments)   Panic attack   Ciprofloxacin Hcl Hives, Other (See Comments)   Panic attack   Penicillins Hives   Has patient had a PCN reaction causing immediate rash, facial/tongue/throat swelling, SOB or lightheadedness with hypotension: Unknown Has patient had a PCN reaction causing severe rash involving mucus membranes or skin necrosis: Unknown Has patient had a PCN reaction that required hospitalization: Unknown Has patient had a PCN reaction occurring within the last 10 years: Unknown If all of the above answers are "NO", then may proceed with Cephalosporin use.   Shrimp [shellfish Allergy] Other (See Comments)   flushing      Medication List    TAKE these medications   cephALEXin 500 MG capsule Commonly known as:  KEFLEX Take 1 capsule (500 mg total) by mouth every 6 (six) hours.   cyclobenzaprine 5 MG tablet Commonly known as:  FLEXERIL Take 5-10 mg by mouth See admin instructions. Pt takes 5 mg three times daily and then 10 mg nightly at bedtime.   diphenhydrAMINE 25 MG tablet Commonly known as:  BENADRYL Take 50 mg by mouth every 6 (six) hours as needed for itching.   docusate sodium 100 MG capsule Commonly known as:  COLACE Take 100 mg by mouth daily as needed for mild constipation.   gabapentin 400 MG capsule Commonly known as:  NEURONTIN Take 800 mg by mouth 4 (four) times daily.   GAS-X EXTRA STRENGTH 125 MG chewable tablet Generic drug:  simethicone Chew 125 mg by  mouth every 6 (six) hours as needed for flatulence.   levothyroxine 125 MCG tablet Commonly known as:  SYNTHROID Take 1 tablet (125 mcg total) by mouth daily before breakfast.   meclizine 25 MG tablet Commonly known as:  ANTIVERT Take 25 mg by mouth 3 (three) times daily as needed for dizziness.   nystatin cream Commonly known as:  MYCOSTATIN Apply 1 application topically as needed for dry skin.   nystatin powder Generic drug:  nystatin Apply 1 Bottle topically 2 (two) times daily. To perineal/groin area and right great toe as needed for yeast   polyethylene glycol packet Commonly known as:  MIRALAX / GLYCOLAX Take 17 g by mouth 2 (two) times daily.   tiZANidine 4 MG tablet Commonly known as:  ZANAFLEX Take 4 mg by mouth 2 (two) times daily.      Follow-up Information    [name], NP. Schedule an appointment as soon as possible for a visit in 1 week(s).   Specialty:  Nurse Practitioner Contact information: [facility] Visits [address] [place] [address] [phone]          Allergies  Allergen Reactions  . Avelox [Moxifloxacin Hcl In Nacl] Hives and Other (See Comments)    Panic attack  . Ciprofloxacin Hcl Hives and Other (See Comments)    Panic attack  . Penicillins Hives    Has patient had a PCN reaction causing immediate rash, facial/tongue/throat swelling, SOB or lightheadedness with hypotension: Unknown Has patient had a PCN reaction causing severe rash involving mucus membranes or skin necrosis: Unknown Has patient had a PCN reaction that required hospitalization: Unknown Has patient had a PCN reaction occurring within the last 10 years: Unknown If all of the above answers are "NO", then may proceed with Cephalosporin use.   . Shrimp [Shellfish Allergy] Other (See Comments)    flushing     Procedures/Studies: EGD Intubation   Dg Chest 2 View  Result Date: 12/07/2017 CLINICAL DATA:  Choking on food tonight. EXAM:  CHEST - 2 VIEW COMPARISON:  February 20, 2014 FINDINGS: The mediastinal contour is normal. Heart size is mildly enlarged. Both lungs are clear. The visualized skeletal structures are stable. IMPRESSION: No active cardiopulmonary disease. Electronically Signed   By: Sherian Rein M.D.   On: 12/07/2017 21:16   Dg Chest Port 1 View  Result Date: 12/10/2017 CLINICAL DATA:  Shortness of breath EXAM: PORTABLE CHEST 1 VIEW COMPARISON:  December 10, 2017 FINDINGS: Mild atelectasis in the left base. No other change or acute abnormalities. IMPRESSION: No active disease. Electronically Signed   By: Gerome Sam III M.D   On: 12/10/2017 20:48   Dg Chest Port 1 View  Result Date: 12/10/2017 CLINICAL DATA:  Shortness of breath. EXAM: PORTABLE CHEST 1 VIEW COMPARISON:  12/09/2017 FINDINGS: The cardiac silhouette is borderline to mildly enlarged. Lung volumes are low with new pulmonary vascular congestion and peribronchial cuffing/diffuse interstitial accentuation. No focal airspace consolidation, sizeable pleural effusion, or pneumothorax is identified. IMPRESSION: Low lung volumes with new pulmonary vascular congestion/mild edema. Electronically Signed   By: Sebastian Ache M.D.   On: 12/10/2017 08:56   Dg Chest Port 1 View  Result Date: 12/09/2017 CLINICAL DATA:  History of aspiration EXAM: PORTABLE CHEST 1 VIEW COMPARISON:  Portable chest x-ray of 12/08/2017 FINDINGS: The left lung base is now much better aerated with only minimal atelectasis remaining. A tiny right pleural effusion cannot be excluded. No new parenchymal infiltrate is seen and mild cardiomegaly is stable. IMPRESSION: Markedly improved aeration of the left lung base. Question tiny right pleural effusion. Electronically Signed   By: Dwyane Dee M.D.   On: 12/09/2017 08:43   Dg Chest Port 1 View  Result Date: 12/08/2017 CLINICAL DATA:  Aspiration pneumonia. EXAM: PORTABLE CHEST 1 VIEW COMPARISON:  12/07/2017 FINDINGS: Endotracheal tube is been removed  since previous study. There has been interval development of left lower lobe atelectasis or consolidation. Right lung remains clear. IMPRESSION: New left lower lobe atelectasis versus consolidation following extubation. Electronically Signed   By: Myles Rosenthal M.D.   On: 12/08/2017 10:19   Portable Chest X-ray  Result Date: 12/08/2017 CLINICAL DATA:  Endotracheal tube placement. EXAM: PORTABLE CHEST 1 VIEW COMPARISON:  Chest radiograph performed earlier today at 8:53 p.m. FINDINGS: The patient's endotracheal tube is seen ending 2-3 cm above the carina. The lungs are well-aerated. Minimal left basilar atelectasis is noted. There is no evidence of pleural effusion or pneumothorax. The lung apices are partially obscured by the patient's head. The cardiomediastinal  silhouette is mildly enlarged. No acute osseous abnormalities are seen. IMPRESSION: 1. Endotracheal tube seen ending 2-3 cm above the carina. 2. Minimal left basilar atelectasis noted. Lungs otherwise clear. 3. Mild cardiomegaly. Electronically Signed   By: Roanna Raider M.D.   On: 12/08/2017 00:33     The results of significant diagnostics from this hospitalization (including imaging, microbiology, ancillary and laboratory) are listed below for reference.     Microbiology: Recent Results (from the past 240 hour(s))  Culture, respiratory (NON-Expectorated)     Status: None   Collection Time: 12/08/17  1:03 AM  Result Value Ref Range Status   Specimen Description   Final    TRACHEAL ASPIRATE Performed at [facility], [address], [place], [place] [address]    Special Requests   Final    Immunocompromised Performed at [facility], [address] [place], [place] [address]    Gram Stain   Final    ABUNDANT WBC PRESENT, PREDOMINANTLY PMN RARE SQUAMOUS EPITHELIAL CELLS PRESENT ABUNDANT GRAM POSITIVE COCCI IN PAIRS MODERATE GRAM NEGATIVE COCCOBACILLI FEW GRAM POSITIVE RODS Performed at [facility], [address] [place], [place] [address]    Culture RARE STAPHYLOCOCCUS AUREUS  Final   Report Status 12/11/2017 FINAL  Final   Organism ID, Bacteria STAPHYLOCOCCUS AUREUS  Final      Susceptibility   Staphylococcus aureus - MIC*    CIPROFLOXACIN <=0.5 SENSITIVE Sensitive     ERYTHROMYCIN <=0.25 SENSITIVE Sensitive     GENTAMICIN <=0.5 SENSITIVE Sensitive     OXACILLIN <=0.25 SENSITIVE Sensitive     TETRACYCLINE <=1 SENSITIVE Sensitive     VANCOMYCIN <=0.5 SENSITIVE Sensitive     TRIMETH/SULFA <=10 SENSITIVE Sensitive     CLINDAMYCIN <=0.25 SENSITIVE Sensitive     RIFAMPIN <=0.5 SENSITIVE Sensitive     Inducible Clindamycin NEGATIVE Sensitive     * RARE STAPHYLOCOCCUS AUREUS  Culture, Urine     Status: Abnormal   Collection Time: 12/08/17  4:20 PM  Result Value Ref Range Status   Specimen Description   Final    URINE, SUPRAPUBIC Performed at [facility], [address], [place], [place] [address]    Special Requests   Final    NONE Performed at [facility], [address], [place], [place] [address]    Culture 20,000 COLONIES/mL STAPHYLOCOCCUS AUREUS (A)  Final   Report Status 12/11/2017 FINAL  Final   Organism ID, Bacteria STAPHYLOCOCCUS AUREUS (A)  Final      Susceptibility   Staphylococcus aureus - MIC*    CIPROFLOXACIN <=0.5 SENSITIVE Sensitive     GENTAMICIN <=0.5 SENSITIVE Sensitive     NITROFURANTOIN <=16 SENSITIVE Sensitive     OXACILLIN <=0.25 SENSITIVE Sensitive     TETRACYCLINE <=1 SENSITIVE Sensitive     VANCOMYCIN <=0.5 SENSITIVE Sensitive     TRIMETH/SULFA <=10 SENSITIVE Sensitive     CLINDAMYCIN <=0.25 SENSITIVE Sensitive     RIFAMPIN <=0.5 SENSITIVE Sensitive     Inducible Clindamycin NEGATIVE Sensitive     * 20,000 COLONIES/mL STAPHYLOCOCCUS AUREUS  MRSA PCR Screening     Status: None   Collection Time: 12/09/17  5:05 AM  Result Value Ref Range Status   MRSA by PCR NEGATIVE NEGATIVE Final     Comment:        The GeneXpert MRSA Assay (FDA approved for NASAL specimens only), is one component of a comprehensive MRSA colonization surveillance program. It is not intended  to diagnose MRSA infection nor to guide or monitor treatment for MRSA infections. Performed at [facility], [address] [place], [place] [address]      Labs: BNP (last 3 results) Recent Labs    12/08/17 0024  BNP 37.2   Basic Metabolic Panel: Recent Labs  Lab 12/08/17 0024 12/08/17 0329 12/09/17 0330 12/10/17 0535 12/11/17 0603  NA  --  143 139 140 139  K  --  3.6 3.3* 3.9 4.3  CL  --  106 105 105 105  CO2  --  27 26 27 25   GLUCOSE  --  138* 99 97 91  BUN  --  12 13 13 9   CREATININE 0.56 0.60 0.78 0.53 0.47  CALCIUM  --  8.7* 8.1* 8.2* 8.4*  MG  --  1.7 1.8 1.8 1.9  PHOS  --  3.0 2.3* 3.4 2.6   Liver Function Tests: Recent Labs  Lab 12/08/17 0329 12/09/17 0330 12/10/17 0535 12/11/17 0603  AST 17 20 15  13*  ALT 12* 12* 10* 9*  ALKPHOS 98 79 77 80  BILITOT 1.1 0.9 0.5 0.7  PROT 7.1 6.0* 6.0* 6.1*  ALBUMIN 3.6 2.9* 2.9* 2.8*   No results for input(s): LIPASE, AMYLASE in the last 168 hours. No results for input(s): AMMONIA in the last 168 hours. CBC: Recent Labs  Lab 12/08/17 0024 12/08/17 0329 12/09/17 0330 12/10/17 0535 12/11/17 0603  WBC 14.9* 23.8* 18.0* 11.6* 10.1  NEUTROABS  --   --  13.8* 8.8* 7.0  HGB 15.2* 15.3* 12.9 13.2 13.7  HCT 46.0 45.9 40.3 41.4 41.9  MCV 95.4 95.6 96.4 96.7 95.7  PLT 498* 546* 483* 442* 415*   Cardiac Enzymes: No results for input(s): CKTOTAL, CKMB, CKMBINDEX, TROPONINI in the last 168 hours. BNP: Invalid input(s): POCBNP CBG: Recent Labs  Lab 12/10/17 1116 12/10/17 1625 12/10/17 2131 12/11/17 0806 12/11/17 1151  GLUCAP 144* 90 122* 92 98   D-Dimer No results for input(s): DDIMER in the last 72 hours. Hgb A1c No results for input(s): HGBA1C in the last 72 hours. Lipid Profile No results for input(s):  CHOL, HDL, LDLCALC, TRIG, CHOLHDL, LDLDIRECT in the last 72 hours. Thyroid function studies No results for input(s): TSH, T4TOTAL, T3FREE, THYROIDAB in the last 72 hours.  Invalid input(s): FREET3 Anemia work up No results for input(s): VITAMINB12, FOLATE, FERRITIN, TIBC, IRON, RETICCTPCT in the last 72 hours. Urinalysis    Component Value Date/Time   COLORURINE YELLOW 12/08/2017 1620   APPEARANCEUR CLOUDY (A) 12/08/2017 1620   LABSPEC 1.015 12/08/2017 1620   PHURINE 6.5 12/08/2017 1620   GLUCOSEU NEGATIVE 12/08/2017 1620   HGBUR LARGE (A) 12/08/2017 1620   BILIRUBINUR NEGATIVE 12/08/2017 1620   KETONESUR TRACE (A) 12/08/2017 1620   PROTEINUR TRACE (A) 12/08/2017 1620   UROBILINOGEN 1.0 07/29/2014 1930   NITRITE NEGATIVE 12/08/2017 1620   LEUKOCYTESUR LARGE (A) 12/08/2017 1620   Sepsis Labs Invalid input(s): PROCALCITONIN,  WBC,  LACTICIDVEN Microbiology Recent Results (from the past 240 hour(s))  Culture, respiratory (NON-Expectorated)     Status: None   Collection Time: 12/08/17  1:03 AM  Result Value Ref Range Status   Specimen Description   Final    TRACHEAL ASPIRATE Performed at [facility], [address], [place], [place] [address]    Special Requests   Final    Immunocompromised Performed at [facility], [address], [place], [place] [address]    Gram Stain   Final  ABUNDANT WBC PRESENT, PREDOMINANTLY PMN RARE SQUAMOUS EPITHELIAL CELLS PRESENT ABUNDANT GRAM POSITIVE COCCI IN PAIRS MODERATE GRAM NEGATIVE COCCOBACILLI FEW GRAM POSITIVE RODS Performed at [facility], [address] [place], [place] [address]    Culture RARE STAPHYLOCOCCUS AUREUS  Final   Report Status 12/11/2017 FINAL  Final   Organism ID, Bacteria STAPHYLOCOCCUS AUREUS  Final      Susceptibility   Staphylococcus aureus - MIC*    CIPROFLOXACIN <=0.5 SENSITIVE Sensitive     ERYTHROMYCIN <=0.25 SENSITIVE Sensitive     GENTAMICIN <=0.5  SENSITIVE Sensitive     OXACILLIN <=0.25 SENSITIVE Sensitive     TETRACYCLINE <=1 SENSITIVE Sensitive     VANCOMYCIN <=0.5 SENSITIVE Sensitive     TRIMETH/SULFA <=10 SENSITIVE Sensitive     CLINDAMYCIN <=0.25 SENSITIVE Sensitive     RIFAMPIN <=0.5 SENSITIVE Sensitive     Inducible Clindamycin NEGATIVE Sensitive     * RARE STAPHYLOCOCCUS AUREUS  Culture, Urine     Status: Abnormal   Collection Time: 12/08/17  4:20 PM  Result Value Ref Range Status   Specimen Description   Final    URINE, SUPRAPUBIC Performed at [facility], [address], [place], [place] [address]    Special Requests   Final    NONE Performed at [facility], [address] [place], [place] [address]    Culture 20,000 COLONIES/mL STAPHYLOCOCCUS AUREUS (A)  Final   Report Status 12/11/2017 FINAL  Final   Organism ID, Bacteria STAPHYLOCOCCUS AUREUS (A)  Final      Susceptibility   Staphylococcus aureus - MIC*    CIPROFLOXACIN <=0.5 SENSITIVE Sensitive     GENTAMICIN <=0.5 SENSITIVE Sensitive     NITROFURANTOIN <=16 SENSITIVE Sensitive     OXACILLIN <=0.25 SENSITIVE Sensitive     TETRACYCLINE <=1 SENSITIVE Sensitive     VANCOMYCIN <=0.5 SENSITIVE Sensitive     TRIMETH/SULFA <=10 SENSITIVE Sensitive     CLINDAMYCIN <=0.25 SENSITIVE Sensitive     RIFAMPIN <=0.5 SENSITIVE Sensitive     Inducible Clindamycin NEGATIVE Sensitive     * 20,000 COLONIES/mL STAPHYLOCOCCUS AUREUS  MRSA PCR Screening     Status: None   Collection Time: 12/09/17  5:05 AM  Result Value Ref Range Status   MRSA by PCR NEGATIVE NEGATIVE Final    Comment:        The GeneXpert MRSA Assay (FDA approved for NASAL specimens only), is one component of a comprehensive MRSA colonization surveillance program. It is not intended to diagnose MRSA infection nor to guide or monitor treatment for MRSA infections. Performed at [facility], [address] [place], [place] [address]       Time coordinating discharge in minutes: 42  SIGNED:   Calvert Cantor, MD  [facility] 12/11/2017, 1:56 PM Pager   If 7PM-7AM, please contact night-coverage [email or web address] Password [email or web address]

## 2017-12-11 NOTE — Progress Notes (Signed)
Modified Barium Swallow Progress Note  Patient Details  Name: [name] MRN: [id number] Date of Birth: [birth year]  Today's Date: 12/11/2017  Modified Barium Swallow completed.  Full report located under Chart Review in the Imaging Section.  Brief recommendations include the following:  Clinical Impression  Pt with minimal oropharyngeal swallow ability without aspiration or penetration of any consistency.  Mildly decreased oral coordination/organization with solids prematurely spillage into pharynx with solids trigger at vallecular space.  Pt did swallow barium tablet with sixth attempt after not successful with thin, pudding alone.  SLP obtained a new tablet and provided with pudding and pt effectively cleared into esophagus.  No lodging observed at cricopharyngeus.  Upon esophageal sweep, pt appeared with residuals and retrograde propulsion that she did NOT sense.   Suspect her primary aspiration risk is due to her esophageal deficits.  Recommend pt follow up with GI for dysphagia management. Using teach back, pt educated to findings/recommendations to mitigate risk.    Swallow Evaluation Recommendations       SLP Diet Recommendations: Dysphagia 3 (Mech soft) solids;Thin liquid   Liquid Administration via: Cup;Straw   Medication Administration: Whole meds with puree   Supervision: Patient able to self feed   Compensations: Slow rate;Small sips/bites(start intake with liquids, drink liquids with meal)   Postural Changes: Seated upright at 90 degrees;Remain semi-upright after after feeds/meals (Comment)   Oral Care Recommendations: Oral care BID       Donavan Burnet, MS [facility] SLP [phone]  Chales Abrahams 12/11/2017,1:55 PM

## 2017-12-11 NOTE — Progress Notes (Signed)
Patient has discharged to home on 12/11/17. Discharge instruction including medication and appointment was given to patient. CM is notified to set up for PTAR.

## 2017-12-11 NOTE — Progress Notes (Signed)
OT Cancellation Note  Patient Details Name: [name] MRN: [id number] DOB: 10-01-1950   Cancelled Treatment:    Reason Eval/Treat Not Completed: OT screened, no needs identified, will sign off; pt is total care for ADLs PTA and use of lift for OOB at home. OT referral is appreciated, will sign off.  Marcy Siren, OT Pager [phone] 12/11/2017   [name] 12/11/2017, 7:58 AM

## 2017-12-11 NOTE — Discharge Instructions (Signed)

## 2017-12-11 NOTE — Care Management Note (Addendum)
Case Management Note  Patient Details  Name: [name] MRN: [id number] Date of Birth: 1950/07/18  Subjective/Objective:                  Transportation home and night caregiver  Action/Plan: p-tar called for transport at 1409.  Spoke to the day caregiver who answered the home phone caregiver will be there tonight.  Expected Discharge Date:  12/11/17               Expected Discharge Plan:     In-House Referral:     Discharge planning Services     Post Acute Care Choice:    Choice offered to:     DME Arranged:    DME Agency:     HH Arranged:    HH Agency:     Status of Service:     If discussed at [employer], dates discussed:    Additional Comments:  Golda Acre, RN 12/11/2017, 2:08 PM

## 2017-12-11 NOTE — Progress Notes (Signed)
  Speech Language Pathology Treatment: Dysphagia  Patient Details Name: [name] MRN: [id number] DOB: 03/13/1951 Today's Date: 12/11/2017 Time: 1435-1450 SLP Time Calculation (min) (ACUTE ONLY): 15 min  Assessment / Plan / Recommendation Clinical Impression  Pt and family (cousin Malachy Mood) educated to findings of MBS and diet modification and compensation strategy recommendation.  Using video flouro loops from study, educated pt and family.  Pt observed to consume liquids via straw with HOB lower  - Advised she only eating sitting as upright as comfortable.  Provided written instructions to cousin for dys3 diet.  Using teach back - pt educated to 3 most important findings on MBS.    Highly recommend follow up with GI as outpatient due to dysphagia/likely aspiration episode.  Pt is asymptomatic to her dysphagia- increasing her risk in this SLPs opinion.   HPI HPI: 67 y.o. female with medical history significant of multiple sclerosis, GERD, sacral decubitus ulcer with bedridden status, recurring UTI and diabetes who was apparently eating spaghetti with green beans tonight when she suddenly choked.  Heimlick's maneuver was performed but patient remained short of breath and hypoxic.  EMS was called and patient was brought to the emergency room.  On arrival in the ER patient was found to have oxygen sats in the 80s.  She was struggling to breathe.  Patient was therefore admitted with possible aspiration pneumonitis.  Pt is s/p EGD with findings of stenosis at proximal esophagus with retained food removed.  GI had rec NPO until further notice per EGD documentation- but hospitalist followed up and GI approved diet order.  Follow up from SLP to assure tolerance and recommend compensation strategies.       SLP Plan  All goals met       Recommendations  Diet recommendations: Dysphagia 3 (mechanical soft);Thin liquid Liquids provided via: Cup;Straw Medication Administration: Whole meds with  puree(start and follow with liquids) Supervision: Patient able to self feed Compensations: Slow rate;Small sips/bites;Other (Comment)(start meals with liquids, drink liquids t/o meal) Postural Changes and/or Swallow Maneuvers: Seated upright 90 degrees;Upright 30-60 min after meal                Oral Care Recommendations: Oral care BID Follow up Recommendations: None;Other (comment)(recommend follow up with GI for esophageal dysphagia management) SLP Visit Diagnosis: Dysphagia, oropharyngeal phase (R13.12);Dysphagia, unspecified (R13.10) Plan: All goals met       GO                Macario Golds 12/11/2017, 3:00 PM  [name], [place] [facility] SLP [phone]

## 2018-05-08 ENCOUNTER — Emergency Department (HOSPITAL_COMMUNITY)
Admission: EM | Admit: 2018-05-08 | Discharge: 2018-05-08 | Disposition: A | Discharge status: Home / Self Care | Payer: Medicare Other | Attending: Emergency Medicine | Admitting: Emergency Medicine

## 2018-05-08 ENCOUNTER — Encounter (HOSPITAL_COMMUNITY): Payer: Self-pay

## 2018-05-08 ENCOUNTER — Other Ambulatory Visit: Payer: Self-pay

## 2018-05-08 DIAGNOSIS — E119 Type 2 diabetes mellitus without complications: Secondary | ICD-10-CM | POA: Insufficient documentation

## 2018-05-08 DIAGNOSIS — Y829 Unspecified medical devices associated with adverse incidents: Secondary | ICD-10-CM | POA: Insufficient documentation

## 2018-05-08 DIAGNOSIS — T83198A Other mechanical complication of other urinary devices and implants, initial encounter: Secondary | ICD-10-CM | POA: Insufficient documentation

## 2018-05-08 DIAGNOSIS — E039 Hypothyroidism, unspecified: Secondary | ICD-10-CM | POA: Diagnosis not present

## 2018-05-08 DIAGNOSIS — Z79899 Other long term (current) drug therapy: Secondary | ICD-10-CM | POA: Insufficient documentation

## 2018-05-08 DIAGNOSIS — T83010A Breakdown (mechanical) of cystostomy catheter, initial encounter: Secondary | ICD-10-CM

## 2018-05-08 NOTE — ED Provider Notes (Signed)
[facility]-EMERGENCY DEPT Provider Note   CSN: [id number] Arrival date & time: 05/08/18  1237     History   Chief Complaint Chief Complaint  Patient presents with  . suprapubic catheter problems    HPI [name] is a 67 y.o. female.  Patient is a 67 year old female with a history of type 2 diabetes, urinary incontinence with a neurogenic bladder and suprapubic catheter as well as immobility resulting in being bedridden from her MS presenting today because her suprapubic catheter fell out last night when she was sleeping.  Patient has no other complaints at this time.  She is not having fever, abdominal pain or any other issues.  She does not know what size her suprapubic catheter was but last time there is record of one being replaced it was an 64 [place].  The history is provided by the patient.    Past Medical History:  Diagnosis Date  . Anxiety   . Arthritis    old falls-with injuries to rt knee and both ankles and rt wrist--states she has some pain in these areas-probable arthritis  . Arthritis    "hands" (09/03/2013)     Hoyer Lift used for transrers  . Bedridden 09/02/2013  . Blindness of right eye    complication of ms  . Cellulitis    hx of cellulitis in lower extremities -requiring multiple hospitalzations in the past--no problems in las 2 yrs  . GERD (gastroesophageal reflux disease)    nexium takes care  . High cholesterol   . Hypothyroidism   . Multiple sclerosis (HCC)    pt is bedridden -does get up to recliner by hoyer lift; mentally alert, able to feed herself -no trouble swallowing. lives at home with husband and has 24 hour caregivers.  . Neuromuscular disorder (HCC)    ms-not ambulatory-muscle spasms-and recent tremors left arm and left leg have developed Dr.--. Daphane Shepherd in [place] is pt's neurologist  . Thyroid disease   . Type II diabetes mellitus (HCC)    diet control - no longer takes any diabetic meds  . Urinary  incontinence    neurogenic bladder--hx of uti's  . UTI (lower urinary tract infection)     Patient Active Problem List   Diagnosis Date Noted  . Suprapubic catheter (HCC) 12/11/2017  . Acute respiratory failure with hypoxia (HCC) 12/08/2017  . Aspiration pneumonia (HCC) 12/07/2017  . Aspiration pneumonitis (HCC) 12/07/2017  . AKI (acute kidney injury) (HCC) 02/18/2014  . SBO (small bowel obstruction) (HCC) 02/17/2014  . Leukocytosis 02/17/2014  . Dyslipidemia 02/17/2014  . Neuropathic pain 02/17/2014  . Sacral decubitus ulcer, stage II (HCC) 09/02/2013  . Bedridden 09/02/2013  . UTI (urinary tract infection) 09/01/2013  . DM type 2 (diabetes mellitus, type 2) (HCC) 09/01/2013  . Multiple sclerosis (HCC)   . GERD (gastroesophageal reflux disease)   . Hypothyroidism   . Blindness of right eye     Past Surgical History:  Procedure Laterality Date  . ADENOIDECTOMY    . CHOLECYSTECTOMY    . CYSTOSCOPY  07/02/2011   Procedure: CYSTOSCOPY;  Surgeon: Kathi Ludwig, MD;  Location: [facility];  Service: Urology;  Laterality: N/A;  . ESOPHAGOGASTRODUODENOSCOPY N/A 12/07/2017   Procedure: ESOPHAGOGASTRODUODENOSCOPY (EGD);  Surgeon: Willis Modena, MD;  Location: [facility];  Service: Endoscopy;  Laterality: N/A;  . FOREIGN BODY REMOVAL  12/07/2017   Procedure: FOREIGN BODY REMOVAL;  Surgeon: Willis Modena, MD;  Location: [facility];  Service: Endoscopy;;  . FRACTURE SURGERY  Left Ankle  . INSERTION OF SUPRAPUBIC CATHETER N/A 01/15/2014   Procedure: CYSTOSCOPY WITH BOTOX INJECTION/SUPRAPUBIC TUBE PLACEMENT;  Surgeon: Kathi Ludwig, MD;  Location: [facility];  Service: Urology;  Laterality: N/A;  . INSERTION OF SUPRAPUBIC CATHETER N/A 04/09/2014   Procedure: SUPRAPUBIC TUBE PLACEMENT ;  Surgeon: Kathi Ludwig, MD;  Location: [facility];  Service: Urology;  Laterality: N/A;  . INSERTION OF SUPRAPUBIC CATHETER N/A 08/19/2014   Procedure: PLACEMENT OF SUPRAPUBIC TUBE  ,cystoscopy, control of bleeding;  Surgeon: Kathi Ludwig, MD;  Location: [facility];  Service: Urology;  Laterality: N/A;  . LYMPH GLAND EXCISION    . MEDIASTINOSCOPY  04/24/10   for enlarged right paratracheal lymph node--surgery at [facility]-Dr. Burney--pt states she does not know the  what the pathology report showed.  . ORIF ANKLE FRACTURE Left 2005  . TONSILLECTOMY    . WRIST SURGERY Right      OB History   None      Home Medications    Prior to Admission medications   Medication Sig Start Date End Date Taking? Authorizing Provider  cephALEXin (KEFLEX) 500 MG capsule Take 1 capsule (500 mg total) by mouth every 6 (six) hours. 12/11/17   Calvert Cantor, MD  cyclobenzaprine (FLEXERIL) 5 MG tablet Take 5-10 mg by mouth See admin instructions. Pt takes 5 mg three times daily and then 10 mg nightly at bedtime.    [provider]  diphenhydrAMINE (BENADRYL) 25 MG tablet Take 50 mg by mouth every 6 (six) hours as needed for itching.    [provider]  docusate sodium (COLACE) 100 MG capsule Take 100 mg by mouth daily as needed for mild constipation.    [provider]  gabapentin (NEURONTIN) 400 MG capsule Take 800 mg by mouth 4 (four) times daily.     [provider]  levothyroxine (SYNTHROID) 125 MCG tablet Take 1 tablet (125 mcg total) by mouth daily before breakfast. Patient not taking: Reported on 12/07/2017 05/11/13   Jeralyn Bennett, MD  meclizine (ANTIVERT) 25 MG tablet Take 25 mg by mouth 3 (three) times daily as needed for dizziness.    [provider]  nystatin (MYCOSTATIN/NYSTOP) 100000 UNIT/GM POWD Apply 1 Bottle topically 2 (two) times daily. To perineal/groin area and right great toe as needed for yeast    [provider]  nystatin cream (MYCOSTATIN) Apply 1 application topically as needed for dry skin.    [provider]  polyethylene glycol (MIRALAX / GLYCOLAX) packet Take 17 g by mouth 2 (two) times daily.  12/11/17   Calvert Cantor, MD  simethicone (GAS-X EXTRA STRENGTH) 125 MG chewable tablet Chew 125 mg by mouth every 6 (six) hours as needed for flatulence.    [provider]  tiZANidine (ZANAFLEX) 4 MG tablet Take 4 mg by mouth 2 (two) times daily. 10/03/17   [provider]    Family History History reviewed. No pertinent family history.  Social History Social History   Tobacco Use  . Smoking status: Never Smoker  . Smokeless tobacco: Never Used  Substance Use Topics  . Alcohol use: No  . Drug use: No     Allergies   Avelox [moxifloxacin hcl in nacl]; Ciprofloxacin hcl; Penicillins; and Shrimp [shellfish allergy]   Review of Systems Review of Systems  All other systems reviewed and are negative.    Physical Exam Updated Vital Signs BP (!) 127/44   Pulse (!) 104   Temp 98.1 F (36.7 C) (Oral)  Resp 16   Ht 5\' 2"  (1.575 m)   Wt 72.6 kg   SpO2 100%   BMI 29.26 kg/m   Physical Exam  Constitutional: She is oriented to person, place, and time. She appears well-developed and well-nourished. No distress.  HENT:  Head: Normocephalic and atraumatic.  Eyes: Pupils are equal, round, and reactive to light.  Cardiovascular: Normal rate.  Pulmonary/Chest: Effort normal.  Abdominal: Bowel sounds are normal. She exhibits no distension and no mass. There is no tenderness. There is no guarding.  Suprapubic cath site without catheter present  Neurological: She is alert and oriented to person, place, and time.  Paresis of lower ext  Skin: Skin is warm and dry.  Psychiatric: She has a normal mood and affect. Her behavior is normal.  Nursing note and vitals reviewed.    ED Treatments / Results  Labs (all labs ordered are listed, but only abnormal results are displayed) Labs Reviewed - No data to display  EKG None  Radiology No results found.  Procedures Procedures (including critical care time)  Medications Ordered in ED Medications - No data to  display   Initial Impression / Assessment and Plan / ED Course  I have reviewed the triage vital signs and the nursing notes.  Pertinent labs & imaging results that were available during my care of the patient were reviewed by me and considered in my medical decision making (see chart for details).     Patient here due to her suprapubic catheter falling out.  Patient has no other complaints at this time.  Replacement 18 French catheter put back in but had to dilate her tract with 14 and 39F catheters to be able to replace the 39F.  Patient was discharged back home.  Final Clinical Impressions(s) / ED Diagnoses   Final diagnoses:  Suprapubic catheter dysfunction, initial encounter [facility])    ED Discharge Orders    None       Gwyneth Sprout, MD 05/08/18 1405

## 2018-05-08 NOTE — ED Notes (Signed)
pTAR called for transport

## 2018-05-08 NOTE — ED Triage Notes (Signed)
Per EMS: Pt woke up this am without her suprapubic catheter.  Home health sent her here per pt request.  Caregiver Karie Kirks) phone number is [phone].  Her brother [name]) number is [phone].

## 2018-05-08 NOTE — ED Notes (Signed)
Bed: WA21 Expected date:  Expected time:  Means of arrival:  Comments: EMS-suprapubic cath issues

## 2019-07-03 DEATH — deceased
